# Patient Record
Sex: Female | Born: 1945 | Race: Black or African American | Hispanic: No | Marital: Single | Smoking: Never smoker
Health system: Southern US, Community
[De-identification: ages and names within clinical notes are randomized; demographics above are authoritative.]

## PROBLEM LIST (undated history)

## (undated) DIAGNOSIS — H25819 Combined forms of age-related cataract, unspecified eye: Secondary | ICD-10-CM

## (undated) DIAGNOSIS — E785 Hyperlipidemia, unspecified: Secondary | ICD-10-CM

## (undated) DIAGNOSIS — H35033 Hypertensive retinopathy, bilateral: Secondary | ICD-10-CM

## (undated) DIAGNOSIS — B1920 Unspecified viral hepatitis C without hepatic coma: Secondary | ICD-10-CM

## (undated) DIAGNOSIS — I10 Essential (primary) hypertension: Secondary | ICD-10-CM

## (undated) DIAGNOSIS — M1990 Unspecified osteoarthritis, unspecified site: Secondary | ICD-10-CM

## (undated) HISTORY — PX: TOTAL ABDOMINAL HYSTERECTOMY: SHX209

## (undated) HISTORY — PX: TUBAL LIGATION: SHX77

## (undated) HISTORY — DX: Unspecified viral hepatitis C without hepatic coma: B19.20

## (undated) HISTORY — DX: Essential (primary) hypertension: I10

## (undated) HISTORY — DX: Hypertensive retinopathy, bilateral: H35.033

## (undated) HISTORY — DX: Hyperlipidemia, unspecified: E78.5

## (undated) HISTORY — DX: Unspecified osteoarthritis, unspecified site: M19.90

## (undated) HISTORY — DX: Combined forms of age-related cataract, unspecified eye: H25.819

---

## 1999-03-05 ENCOUNTER — Encounter: Payer: Self-pay | Admitting: Family Medicine

## 1999-03-05 ENCOUNTER — Inpatient Hospital Stay (HOSPITAL_COMMUNITY): Admission: EM | Admit: 1999-03-05 | Discharge: 1999-03-06 | Payer: Self-pay | Admitting: Emergency Medicine

## 1999-03-17 ENCOUNTER — Encounter: Admission: RE | Admit: 1999-03-17 | Discharge: 1999-06-15 | Payer: Self-pay | Admitting: Family Medicine

## 2000-10-10 ENCOUNTER — Emergency Department (HOSPITAL_COMMUNITY): Admission: EM | Admit: 2000-10-10 | Discharge: 2000-10-10 | Payer: Self-pay | Admitting: Internal Medicine

## 2001-01-12 ENCOUNTER — Other Ambulatory Visit: Admission: RE | Admit: 2001-01-12 | Discharge: 2001-01-12 | Payer: Self-pay | Admitting: Family Medicine

## 2001-10-23 ENCOUNTER — Encounter: Payer: Self-pay | Admitting: Family Medicine

## 2001-10-23 ENCOUNTER — Ambulatory Visit (HOSPITAL_COMMUNITY): Admission: RE | Admit: 2001-10-23 | Discharge: 2001-10-23 | Payer: Self-pay | Admitting: Family Medicine

## 2002-01-07 ENCOUNTER — Emergency Department (HOSPITAL_COMMUNITY): Admission: EM | Admit: 2002-01-07 | Discharge: 2002-01-07 | Payer: Self-pay | Admitting: Emergency Medicine

## 2002-01-25 ENCOUNTER — Encounter (HOSPITAL_BASED_OUTPATIENT_CLINIC_OR_DEPARTMENT_OTHER): Admission: RE | Admit: 2002-01-25 | Discharge: 2002-04-23 | Payer: Self-pay | Admitting: Internal Medicine

## 2002-01-29 ENCOUNTER — Other Ambulatory Visit: Admission: RE | Admit: 2002-01-29 | Discharge: 2002-01-29 | Payer: Self-pay | Admitting: Family Medicine

## 2002-05-28 ENCOUNTER — Encounter (HOSPITAL_BASED_OUTPATIENT_CLINIC_OR_DEPARTMENT_OTHER): Admission: RE | Admit: 2002-05-28 | Discharge: 2002-08-26 | Payer: Self-pay | Admitting: Internal Medicine

## 2003-02-01 ENCOUNTER — Ambulatory Visit (HOSPITAL_COMMUNITY): Admission: RE | Admit: 2003-02-01 | Discharge: 2003-02-01 | Payer: Self-pay | Admitting: Gastroenterology

## 2003-02-01 ENCOUNTER — Encounter: Payer: Self-pay | Admitting: Gastroenterology

## 2003-02-01 ENCOUNTER — Encounter (INDEPENDENT_AMBULATORY_CARE_PROVIDER_SITE_OTHER): Payer: Self-pay | Admitting: *Deleted

## 2003-07-10 ENCOUNTER — Other Ambulatory Visit: Admission: RE | Admit: 2003-07-10 | Discharge: 2003-07-10 | Payer: Self-pay | Admitting: Family Medicine

## 2004-04-22 ENCOUNTER — Encounter (HOSPITAL_BASED_OUTPATIENT_CLINIC_OR_DEPARTMENT_OTHER): Admission: RE | Admit: 2004-04-22 | Discharge: 2004-04-24 | Payer: Self-pay | Admitting: Internal Medicine

## 2005-11-29 ENCOUNTER — Encounter: Admission: RE | Admit: 2005-11-29 | Discharge: 2005-12-15 | Payer: Self-pay | Admitting: *Deleted

## 2007-04-28 ENCOUNTER — Ambulatory Visit: Payer: Self-pay | Admitting: Vascular Surgery

## 2007-04-28 ENCOUNTER — Ambulatory Visit (HOSPITAL_COMMUNITY): Admission: RE | Admit: 2007-04-28 | Discharge: 2007-04-28 | Payer: Self-pay | Admitting: Family Medicine

## 2008-01-31 ENCOUNTER — Ambulatory Visit: Payer: Self-pay | Admitting: Internal Medicine

## 2008-01-31 LAB — CONVERTED CEMR LAB
AST: 100 units/L — ABNORMAL HIGH (ref 0–37)
BUN: 12 mg/dL (ref 6–23)
Basophils Relative: 1 % (ref 0–1)
CO2: 26 meq/L (ref 19–32)
Calcium: 9.3 mg/dL (ref 8.4–10.5)
Chloride: 102 meq/L (ref 96–112)
Cholesterol: 172 mg/dL (ref 0–200)
Creatinine, Ser: 0.8 mg/dL (ref 0.40–1.20)
Glucose, Bld: 89 mg/dL (ref 70–99)
HDL: 64 mg/dL (ref >39)
Hemoglobin: 13.1 g/dL (ref 12.0–15.0)
Lymphs Abs: 2.5 10*3/uL (ref 0.7–4.0)
MCHC: 32.5 g/dL (ref 30.0–36.0)
MCV: 83.4 fL (ref 78.0–100.0)
Monocytes Absolute: 0.4 10*3/uL (ref 0.1–1.0)
Monocytes Relative: 9 % (ref 3–12)
Neutro Abs: 1.9 10*3/uL (ref 1.7–7.7)
RBC: 4.83 M/uL (ref 3.87–5.11)
Total CHOL/HDL Ratio: 2.7
Triglycerides: 90 mg/dL (ref <150)

## 2008-02-02 ENCOUNTER — Ambulatory Visit (HOSPITAL_COMMUNITY): Admission: RE | Admit: 2008-02-02 | Discharge: 2008-02-02 | Payer: Self-pay | Admitting: Family Medicine

## 2008-03-05 ENCOUNTER — Ambulatory Visit: Payer: Self-pay | Admitting: *Deleted

## 2008-03-12 ENCOUNTER — Ambulatory Visit: Payer: Self-pay | Admitting: Internal Medicine

## 2008-03-12 ENCOUNTER — Encounter: Payer: Self-pay | Admitting: Internal Medicine

## 2008-06-12 ENCOUNTER — Ambulatory Visit: Payer: Self-pay | Admitting: Internal Medicine

## 2008-07-25 ENCOUNTER — Encounter: Admission: RE | Admit: 2008-07-25 | Discharge: 2008-07-25 | Payer: Self-pay | Admitting: Internal Medicine

## 2008-10-17 ENCOUNTER — Ambulatory Visit: Payer: Self-pay | Admitting: Internal Medicine

## 2008-10-23 ENCOUNTER — Encounter: Payer: Self-pay | Admitting: Cardiology

## 2008-11-12 ENCOUNTER — Ambulatory Visit: Payer: Self-pay | Admitting: Internal Medicine

## 2008-11-14 ENCOUNTER — Ambulatory Visit (HOSPITAL_COMMUNITY): Admission: RE | Admit: 2008-11-14 | Discharge: 2008-11-14 | Payer: Self-pay | Admitting: Internal Medicine

## 2008-12-11 ENCOUNTER — Ambulatory Visit: Payer: Self-pay | Admitting: Cardiology

## 2008-12-11 DIAGNOSIS — R0602 Shortness of breath: Secondary | ICD-10-CM

## 2008-12-12 DIAGNOSIS — E785 Hyperlipidemia, unspecified: Secondary | ICD-10-CM

## 2008-12-12 DIAGNOSIS — I10 Essential (primary) hypertension: Secondary | ICD-10-CM

## 2008-12-12 DIAGNOSIS — F17200 Nicotine dependence, unspecified, uncomplicated: Secondary | ICD-10-CM | POA: Insufficient documentation

## 2008-12-24 ENCOUNTER — Telehealth (INDEPENDENT_AMBULATORY_CARE_PROVIDER_SITE_OTHER): Payer: Self-pay | Admitting: *Deleted

## 2008-12-25 ENCOUNTER — Encounter: Payer: Self-pay | Admitting: Internal Medicine

## 2008-12-25 ENCOUNTER — Ambulatory Visit: Payer: Self-pay

## 2009-01-01 ENCOUNTER — Ambulatory Visit: Payer: Self-pay | Admitting: Internal Medicine

## 2009-03-05 ENCOUNTER — Ambulatory Visit: Payer: Self-pay | Admitting: Internal Medicine

## 2009-03-18 ENCOUNTER — Ambulatory Visit: Payer: Self-pay | Admitting: Internal Medicine

## 2009-03-18 LAB — CONVERTED CEMR LAB: Microalb, Ur: 0.5 mg/dL (ref 0.00–1.89)

## 2009-06-17 ENCOUNTER — Ambulatory Visit: Payer: Self-pay | Admitting: Internal Medicine

## 2009-06-17 LAB — CONVERTED CEMR LAB
ALT: 63 units/L — ABNORMAL HIGH (ref 0–35)
AST: 57 units/L — ABNORMAL HIGH (ref 0–37)
Albumin: 3.9 g/dL (ref 3.5–5.2)
BUN: 11 mg/dL (ref 6–23)
CO2: 25 meq/L (ref 19–32)
Calcium: 9.3 mg/dL (ref 8.4–10.5)
Chloride: 101 meq/L (ref 96–112)
Creatinine, Ser: 0.77 mg/dL (ref 0.40–1.20)
Eosinophils Absolute: 0.1 10*3/uL (ref 0.0–0.7)
Eosinophils Relative: 1 % (ref 0–5)
HCT: 37.5 % (ref 36.0–46.0)
Hemoglobin: 12.1 g/dL (ref 12.0–15.0)
Lymphocytes Relative: 46 % (ref 12–46)
Lymphs Abs: 2.2 10*3/uL (ref 0.7–4.0)
MCV: 83.5 fL (ref 78.0–100.0)
Monocytes Relative: 12 % (ref 3–12)
Potassium: 3.9 meq/L (ref 3.5–5.3)
RBC: 4.49 M/uL (ref 3.87–5.11)
WBC: 4.7 10*3/uL (ref 4.0–10.5)

## 2009-09-01 ENCOUNTER — Encounter: Admission: RE | Admit: 2009-09-01 | Discharge: 2009-09-01 | Payer: Self-pay | Admitting: Internal Medicine

## 2009-10-01 ENCOUNTER — Ambulatory Visit: Payer: Self-pay | Admitting: Family Medicine

## 2009-12-03 ENCOUNTER — Ambulatory Visit: Payer: Self-pay | Admitting: Internal Medicine

## 2009-12-03 LAB — CONVERTED CEMR LAB
Cholesterol: 137 mg/dL (ref 0–200)
Direct LDL: 71 mg/dL
HDL: 45 mg/dL (ref >39)
Hgb A1c MFr Bld: 6.6 % — ABNORMAL HIGH (ref <5.7)
Total CHOL/HDL Ratio: 3

## 2010-03-04 ENCOUNTER — Ambulatory Visit: Payer: Self-pay | Admitting: Internal Medicine

## 2010-06-09 ENCOUNTER — Inpatient Hospital Stay (INDEPENDENT_AMBULATORY_CARE_PROVIDER_SITE_OTHER)
Admission: RE | Admit: 2010-06-09 | Discharge: 2010-06-09 | Disposition: A | Discharge status: Home / Self Care | Payer: Self-pay | Source: Ambulatory Visit | Attending: Family Medicine | Admitting: Family Medicine

## 2010-06-09 DIAGNOSIS — T2610XA Burn of cornea and conjunctival sac, unspecified eye, initial encounter: Secondary | ICD-10-CM

## 2010-09-04 NOTE — Consult Note (Signed)
NAMEMarland Kitchen  Cindy Taylor, Cindy Taylor NO.:  [id number]   MEDICAL RECORD NO.:  [id number]                    PATIENT TYPE:   LOCATION:                                       FACILITY:   PHYSICIAN:  Jonelle Sports. Sevier, M.D.              DATE OF BIRTH:   DATE OF CONSULTATION:  DATE OF DISCHARGE:                                   CONSULTATION   HISTORY OF PRESENT ILLNESS:  This 65 year old black female comes self  referred for evaluation of several calluses on her feet. The patient has had  type II diabetes mellitus for some time and apparently is in reasonably good  control of that disease. Recent capillary blood glucose was 129. She has  never had serious problems with her toes but had read in her health plan  materials the importance of good foot care and realized that she did have  some calluses and so came in for evaluation.   PAST MEDICAL HISTORY:  Notable for known abnormal liver function studies and  pneumomycosis.   ALLERGIES:  No known drug allergies.   MEDICATIONS:  Premarin.   PHYSICAL EXAMINATION:  EXTREMITIES: Examination today is limited to the  distal lower extremities. It is noted that the patient has quite a wide fore  foot anteriorly, though without any significant deformity per say. Her  pulses are adequate throughout her feet. She has no significant edema.  Testing is intact throughout. She does have callus formation on the dorsal  lateral aspects of both fifth toes, on the medial aspects of both hallices  (with both these being indicative of the squeeze of foot wear that is too  narrow anteriorly) as well as callus underlying the fifth metatarsal head  area on the left and calluses bilaterally at the heels.   DISPOSITION:  1. The patient was given instructions regarding foot care and diabetes     mellitus by video with nurse and physician reinforcement.  2. The matter of proper foot wear is discussed with her at some length and     she is  advised that she needs a so called wide toe shoe, in which the toe     is made purposely broader than the heel. She is referred to the [facility]     on [address] and/or to [employer], to investigate     obtaining some such shoes.  3. The calluses on the dorsal lateral aspects of both fifth toes and on the     medial aspects of hallices are sharply repaired  without incident. The     callus underlying the fifth metatarsal head on the left is found to be at     the site of an old trauma and this again, is reduced satisfactorily. The     lessor calluses on the heels do not require any attention at this point.  4. The patient is commended on consulting  [place] for foot care before she had     more serious problems.  5. It is recommended to the patient that she may return here on a as needed     basis but that in all likelihood, she may need some additional attention     with these calluses over several months, for example, three to four     months.                                               Jonelle Sports. Cheryll Cockayne, M.D.   RES/MEDQ  D:  01/25/2002  T:  01/25/2002  Job:  [id number]   cc:   Meredith Staggers, M.D.

## 2010-09-04 NOTE — Consult Note (Signed)
NAME:  Cindy Taylor, Cindy Taylor NO.:  [id number]   MEDICAL RECORD NO.:  [id number]          PATIENT TYPE:  REC   LOCATION:  FOOT                         FACILITY:  [facility]   PHYSICIAN:  Jonelle Sports. Sevier, M.D. DATE OF BIRTH:  05-Dec-1945   DATE OF CONSULTATION:  04/23/2004  DATE OF DISCHARGE:                                   CONSULTATION   HISTORY OF PRESENT ILLNESS:  This 65 year old black female has been referred  by the courtesy of Dr. Dayton Scrape for evaluation of soft tissue mass of the left  foot and pain in the nail of the right hallux.   The patient's medical history is notable for diabetes, type 2, which has  been apparently mild in nature and is currently managed with no medication  with hemoglobin A1C of 5.4.  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  She again is on no treatment for that.   The patient has had no previous foot problems of any particular note until  recent months when she has noted some tendency towards soft tissue swelling  on the dorsum of the left foot at approximately the navicular __________  area.  This comes and goes, is never particularly painful.  Is not  associated with any erythema, particularly painful.  Is not associated with  any erythema or warmth.  Has never drained or without given her any  particular problems.  She is just concerned to know what it may be.  She  apparently was told once in the past that if it did not bother her it did  not bother the doctor, and consequently, no specific recommendation was  made.   The patient's other problem was one of tenderness along the medial edge of  the nail of the right hallux.  She has had obvious thickening and a needle  mycosis of the hallucal nails bilaterally for some time, and is just noted  that recently she has a tenderness toward pain in the medial margin of that  nail.  Again, there has been no  recognized drainage.  There has been no  localized inflammation suggestive of paronychia.   She is here today for our evaluation regarding these two problems.   PAST MEDICAL HISTORY:  1.  Notable only for those things previously mentioned.  2.  She has no know medicinal allergies.  3.  Her only medication is Premarin 0.625 mg daily.   PHYSICAL EXAMINATION:  Examination today is limited to the distal lower  extremities.  The patient's feet are short and rather broad but without  gross deformity.  Skin temperature are normal and symmetrical.  Pulses are  everywhere palpable and quite adequate.  Monofilament testing shows  preservation of protective sensation throughout both feet.  There is no  edema.  The patient has callous formations at the interphalangeal joint  areas of the halluces bilaterally, on the medial aspects and also across the  lateral calluses on the fifth toes bilaterally.   The soft tissue mass of concern to her is minimally  apparent today, is in  the dorsal mid foot area on the left, is not tender and is a moveable mass  with its deep margins not discernable.  There is no discoloration nor  limitation of motion in that foot.   IMPRESSION:  1.  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  2.  Onychomycosis of the halluces bilaterally with some early ingrowth of      the nail of the right hallux with hypertrophied callous formation at its      medial margin.   DISPOSITION:  1.  The patient was given instruction regarding foot care and diabetes by      video with some nurse and physician reinforcement.  2.  She is reassured that the synovial cyst of the left foot requires no      treatment unless it should become far more problematic than it is at      present, given that circumstance that a relatively simple surgery would      be probably the advised approach, but that the likelihood of her ever      needing this is essentially nil.  3.  The  aforementioned calluses on the halluces and fifth toes bilaterally      are sharply paired without incident.  4.  The callous around the medial aspect and tip of the right hallux is      sharply paired away, and this allows me, in turn, to debrided away some      of the hard crusty mycotic subungual material.  She reports that the toe      is immediately more comfortable.  There is no evidence of perionychial      inflammation and no drainage is noted.  An elevator is used and reveals      that there is not yet significant ingrowth of that distal medial corner      of that hallucal nail.  5.  It is recommended to the patient that she obtain some oral Tinactin      (tolnaftate) and that she apply this twice daily around the nails and at      the tips of the toes so that it can run subungual of the halluces of      both feet.  6.  Follow up visit will be here on a p.r.n. basis.       RES/MEDQ  D:  04/23/2004  T:  04/23/2004  Job:  [id number]   cc:   Meredith Staggers, M.D.  [address]  [place]  [place] [address]  Fax: [phone]

## 2010-10-22 ENCOUNTER — Encounter: Payer: Self-pay | Admitting: Cardiology

## 2010-10-23 ENCOUNTER — Other Ambulatory Visit: Payer: Self-pay | Admitting: Family Medicine

## 2010-10-23 DIAGNOSIS — Z1231 Encounter for screening mammogram for malignant neoplasm of breast: Secondary | ICD-10-CM

## 2010-11-04 ENCOUNTER — Ambulatory Visit
Admission: RE | Admit: 2010-11-04 | Discharge: 2010-11-04 | Disposition: A | Discharge status: Home / Self Care | Payer: Self-pay | Source: Ambulatory Visit | Attending: Family Medicine | Admitting: Family Medicine

## 2010-11-04 DIAGNOSIS — Z1231 Encounter for screening mammogram for malignant neoplasm of breast: Secondary | ICD-10-CM

## 2011-11-15 ENCOUNTER — Other Ambulatory Visit: Payer: Self-pay | Admitting: Family Medicine

## 2011-11-15 DIAGNOSIS — Z1231 Encounter for screening mammogram for malignant neoplasm of breast: Secondary | ICD-10-CM

## 2011-11-29 ENCOUNTER — Ambulatory Visit
Admission: RE | Admit: 2011-11-29 | Discharge: 2011-11-29 | Disposition: A | Discharge status: Home / Self Care | Payer: Self-pay | Source: Ambulatory Visit | Attending: Family Medicine | Admitting: Family Medicine

## 2011-11-29 ENCOUNTER — Other Ambulatory Visit: Payer: Self-pay | Admitting: Family Medicine

## 2011-11-29 DIAGNOSIS — Z1239 Encounter for other screening for malignant neoplasm of breast: Secondary | ICD-10-CM

## 2011-11-29 DIAGNOSIS — Z1231 Encounter for screening mammogram for malignant neoplasm of breast: Secondary | ICD-10-CM

## 2012-05-10 ENCOUNTER — Emergency Department (HOSPITAL_COMMUNITY)
Admission: EM | Admit: 2012-05-10 | Discharge: 2012-05-10 | Disposition: A | Discharge status: Home / Self Care | Payer: Self-pay | Source: Home / Self Care | Attending: Family Medicine | Admitting: Family Medicine

## 2012-05-10 ENCOUNTER — Encounter (HOSPITAL_COMMUNITY): Payer: Self-pay

## 2012-05-10 DIAGNOSIS — E785 Hyperlipidemia, unspecified: Secondary | ICD-10-CM

## 2012-05-10 DIAGNOSIS — E119 Type 2 diabetes mellitus without complications: Secondary | ICD-10-CM

## 2012-05-10 DIAGNOSIS — Z23 Encounter for immunization: Secondary | ICD-10-CM

## 2012-05-10 DIAGNOSIS — K029 Dental caries, unspecified: Secondary | ICD-10-CM

## 2012-05-10 DIAGNOSIS — I10 Essential (primary) hypertension: Secondary | ICD-10-CM

## 2012-05-10 LAB — COMPREHENSIVE METABOLIC PANEL
ALT: 132 U/L — ABNORMAL HIGH (ref 0–35)
AST: 90 U/L — ABNORMAL HIGH (ref 0–37)
Albumin: 3.7 g/dL (ref 3.5–5.2)
Alkaline Phosphatase: 57 U/L (ref 39–117)
CO2: 28 mEq/L (ref 19–32)
Chloride: 97 mEq/L (ref 96–112)
Creatinine, Ser: 0.62 mg/dL (ref 0.50–1.10)
GFR calc non Af Amer: >90 mL/min (ref >90)
Potassium: 3.7 mEq/L (ref 3.5–5.1)
Total Bilirubin: 0.3 mg/dL (ref 0.3–1.2)

## 2012-05-10 LAB — LIPID PANEL
HDL: 51 mg/dL (ref >39)
LDL Cholesterol: 66 mg/dL (ref 0–99)
Total CHOL/HDL Ratio: 3.1 RATIO
VLDL: 39 mg/dL (ref 0–40)

## 2012-05-10 MED ORDER — GLIPIZIDE 5 MG PO TABS: 10.0000 mg | ORAL_TABLET | Freq: Two times a day (BID) | ORAL | Status: DC

## 2012-05-10 MED ORDER — METFORMIN HCL 1000 MG PO TABS: 1000.0000 mg | ORAL_TABLET | Freq: Two times a day (BID) | ORAL | Status: DC

## 2012-05-10 MED ORDER — INFLUENZA VIRUS VACC SPLIT PF IM SUSP
0.5000 mL | Freq: Once | INTRAMUSCULAR | Status: AC
  Administered 2012-05-10: 0.5 mL via INTRAMUSCULAR

## 2012-05-10 MED ORDER — INSULIN PEN NEEDLE 31G X 8 MM MISC: 1.0000 [IU] | Status: DC

## 2012-05-10 MED ORDER — INSULIN GLARGINE 100 UNIT/ML ~~LOC~~ SOLN: 20.0000 [IU] | Freq: Every day | SUBCUTANEOUS | Status: DC

## 2012-05-10 NOTE — ED Notes (Signed)
Former [facility] client here to establish a primary care dr. Has not used insulin in almost 4 months-

## 2012-05-10 NOTE — ED Provider Notes (Signed)
History    CSN: [id number]  Arrival date & time 05/10/12  1509  Chief Complaint  Patient presents with  . Diabetes   HPI Pt presenting for refills of her medications for DM.  She reports that she's been out of her Lantus for quite some time because she hasn't been able to get the medication refilled.  She would like to go ahead and get a prescription refill today and then she will pursue resuming her Lantus treatment.  She was taking 20 units of Lantus every evening.  She reports that that improved her glycemic control significantly.  She's not able to test her blood sugar at this time because she does not testing strips available.  She reports that she's going to have medical insurance in the next 10 days and at that time she will go ahead and be able to get her medications filled appropriately.  Past Medical History  Diagnosis Date  . Diabetes mellitus     mellitus? x 9 years  . HTN (hypertension)     x8 years  . Hepatitis C     History reviewed. No pertinent past surgical history.  No family history on file.  History  Substance Use Topics  . Smoking status: Not on file  . Smokeless tobacco: Not on file  . Alcohol Use: Not on file    OB History    Grav Para Term Preterm Abortions TAB SAB Ect Mult Living                 Review of Systems  HENT: Negative.   Eyes: Negative.   Respiratory: Negative.   Cardiovascular: Negative.   Gastrointestinal: Negative.   Musculoskeletal: Negative.   All other systems reviewed and are negative.    Allergies  Review of patient's allergies indicates no known allergies.  Home Medications   Current Outpatient Rx  Name  Route  Sig  Dispense  Refill  . ASPIRIN 81 MG PO TBEC   Oral   Take 81 mg by mouth daily.           Marland Kitchen GLIPIZIDE 5 MG PO TABS   Oral   Take 5 mg by mouth daily.           Marland Kitchen LISINOPRIL-HYDROCHLOROTHIAZIDE 10-12.5 MG PO TABS   Oral   Take 1 tablet by mouth daily.           Marland Kitchen METFORMIN HCL 500 MG PO  TABS   Oral   Take 500 mg by mouth 2 (two) times daily.           . OYSTER SHELL CALCIUM 500 MG PO TABS   Oral   Take 1 tablet by mouth daily.           Marland Kitchen RALOXIFENE HCL 60 MG PO TABS   Oral   Take 60 mg by mouth daily.            BP 136/69  Pulse 60  Temp 98.6 F (37 C) (Oral)  Resp 17  SpO2 97%  Physical Exam  Nursing note and vitals reviewed. Constitutional: She is oriented to person, place, and time. She appears well-developed and well-nourished. No distress.  HENT:  Head: Normocephalic and atraumatic.  Eyes: EOM are normal. Pupils are equal, round, and reactive to light.  Neck: Normal range of motion. Neck supple.  Cardiovascular: Normal rate, regular rhythm and normal heart sounds.   Pulmonary/Chest: Effort normal and breath sounds normal. No respiratory distress. She has no wheezes. She  has no rales. She exhibits no tenderness.  Abdominal: Soft. Bowel sounds are normal.  Musculoskeletal: Normal range of motion.  Neurological: She is alert and oriented to person, place, and time. She has normal reflexes.  Skin: Skin is warm and dry. No rash noted. No erythema. No pallor.  Psychiatric: She has a normal mood and affect. Her behavior is normal. Judgment and thought content normal.    ED Course  Procedures (including critical care time)  Labs Reviewed - No data to display No results found.  No diagnosis found.  MDM  IMPRESSION  Diabetes mellitus type 2  Hypertension  RECOMMENDATIONS / PLAN CHECK LABS TODAY INFLUENZA VACCINE GIVEN TODAY REFILLED INSULIN (LANTUS SOLOSTAR PEN)   FOLLOW UP 3 MONTHS   The patient was given clear instructions to go to ER or return to medical center if symptoms don't improve, worsen or new problems develop.  The patient verbalized understanding.  The patient was told to call to get lab results if they haven't heard anything in the next week.            Cleora Fleet, MD 05/10/12 1920

## 2012-05-11 ENCOUNTER — Telehealth (HOSPITAL_COMMUNITY): Payer: Self-pay

## 2012-05-11 NOTE — Progress Notes (Signed)
Quick Note:  Please notify patient that her labs came back stable. Her liver enzymes are stable at this time. I would like for her to have her labs rechecked again in 3-4 months. Her blood sugar is elevated.    Rodney Langton, MD, CDE, FAAFP [facility] [place], [place]   ______

## 2012-05-11 NOTE — Telephone Encounter (Signed)
Message copied by Lestine Mount on Thu May 11, 2012  5:51 PM ------      Message from: Cleora Fleet      Created: Thu May 11, 2012  3:09 PM       Please notify patient that her labs came back stable.  Her liver enzymes are stable at this time.  I would like for her to have her labs rechecked again in 3-4 months.  Her blood sugar is elevated.                        Rodney Langton, MD, CDE, FAAFP      [facility]      [facility]      [place], [place]

## 2012-10-16 ENCOUNTER — Ambulatory Visit: Payer: Medicare Other | Attending: Family Medicine | Admitting: Internal Medicine

## 2012-10-16 VITALS — BP 133/76 | HR 71 | Temp 99.0°F | Ht 64.0 in | Wt 159.0 lb

## 2012-10-16 DIAGNOSIS — I10 Essential (primary) hypertension: Secondary | ICD-10-CM

## 2012-10-16 DIAGNOSIS — E1310 Other specified diabetes mellitus with ketoacidosis without coma: Secondary | ICD-10-CM

## 2012-10-16 DIAGNOSIS — E1110 Type 2 diabetes mellitus with ketoacidosis without coma: Secondary | ICD-10-CM | POA: Insufficient documentation

## 2012-10-16 DIAGNOSIS — B1920 Unspecified viral hepatitis C without hepatic coma: Secondary | ICD-10-CM

## 2012-10-16 MED ORDER — METFORMIN HCL 1000 MG PO TABS: 1000.0000 mg | ORAL_TABLET | Freq: Two times a day (BID) | ORAL | Status: DC

## 2012-10-16 MED ORDER — LISINOPRIL-HYDROCHLOROTHIAZIDE 10-12.5 MG PO TABS: 1.0000 | ORAL_TABLET | Freq: Every day | ORAL | Status: DC

## 2012-10-16 MED ORDER — INSULIN PEN NEEDLE 31G X 8 MM MISC: 1.0000 [IU] | Status: DC

## 2012-10-16 MED ORDER — GLIPIZIDE 5 MG PO TABS: 10.0000 mg | ORAL_TABLET | Freq: Two times a day (BID) | ORAL | Status: DC

## 2012-10-16 MED ORDER — METOPROLOL SUCCINATE ER 50 MG PO TB24: 50.0000 mg | ORAL_TABLET | Freq: Every day | ORAL | Status: DC

## 2012-10-16 MED ORDER — PRAVASTATIN SODIUM 20 MG PO TABS: 20.0000 mg | ORAL_TABLET | Freq: Every day | ORAL | Status: DC

## 2012-10-16 MED ORDER — OYSTER SHELL CALCIUM 500 MG PO TABS: 1.0000 | ORAL_TABLET | Freq: Two times a day (BID) | ORAL | Status: DC

## 2012-10-16 MED ORDER — FINGERSTIX LANCETS MISC: 1.0000 | Freq: Three times a day (TID) | Status: DC

## 2012-10-16 MED ORDER — INSULIN GLARGINE 100 UNIT/ML SOLOSTAR PEN: 20.0000 [IU] | PEN_INJECTOR | Freq: Every day | SUBCUTANEOUS | Status: DC

## 2012-10-16 NOTE — Patient Instructions (Signed)
Type 2 Diabetes Mellitus, Adult  Type 2 diabetes mellitus is a long-term (chronic) disease. In type 2 diabetes:   The pancreas does not make enough of a hormone called insulin.   The cells in the body do not respond as well to the insulin that is made.   Both of the above can happen.  Normally, insulin moves sugars from food into tissue cells. This gives you energy. If you have type 2 diabetes, sugars cannot be moved into tissue cells. This causes high blood sugar (hyperglycemia).   HOME CARE   Have your hemoglobin A1c level checked twice a year. The level shows if your diabetes is under control or out of control.   Perform daily blood sugar testing as told by your doctor.   Check your keytone levels by testing your pee (urine) when you are sick and as told.   Take your diabetes or insulin medicine as told by your doctor.   Never run out of insulin.   Adjust how much insulin you give yourself based on how many carbs (carbohydrates) you eat. Carbs are in many foods, such as fruits, vegetables, whole grains, and dairy products.   Have a healthy snack between every healthy meal. Have 3 meals and 3 snacks a day.   Lose weight if you are overweight.   Carry a medical alert card or wear your medical alert jewelry.   Carry a 15 gram carb snack with you at all times. Examples include:   Glucose pills, 3 or 4.   Glucose gel, 15 gram tube.   Raisins, 2 tablespoons (24 grams).   Jelly beans, 6.   Animal crackers, 8.   Sugar pop, 4 ounces (120 milliliters).   Gummy treats, 9.   Notice low blood sugar (hypoglycemia) symptoms, such as:   Shaking (tremors).   Decreased ability to think clearly.   Sweating.   Increased heart rate.   Headache.   Dry mouth.   Hunger.   Crabbiness (irritability).   Being worried or tense (anxiety).   Restless sleep.   A change in speech or coordination.   Confusion.   Treat low blood sugar right away. If you are alert and can swallow, follow the 15:15 rule:   Take 15  20 grams of a rapid-acting glucose or carb. This includes glucose gel, glucose pills, or 4 ounces (120 milliters) of fruit juice, regular pop, or low-fat milk.   Check your blood sugar level after taking the glucose.   Take 15 20 grams of more glucose if the repeat blood sugar level is still 70 mg/dL (milligrams/deciliter) or below.   Eat a meal or snack within 1 hour of the blood sugar levels going back to normal.   Notice early symptoms of high blood sugar, such as:   Being really thirsty or drinking a lot (polydipsia).   Peeing (urinating) a lot (polyuria).   Do at least 150 minutes of physical activitity a week or as told.   Split the 150 minutes of activity up during the week. Do not do 150 minutes of activity in one day.   Perform exercises, such as weight lifting, at least 2 times a week or as told.   Adjust your insulin or food intake as needed if you start a new exercise or sport.   Follow your sick day plan when you are not able to eat or drink as usual.   Avoid tobacco use.   Women who are not pregnant should drink no more   than 1 drink a day. Men should drink no more than 2 drinks a day.   Only drink alcohol with food.   Ask your doctor if alcohol is safe for you.   Tell your doctor if you drink alcohol several times during the week.   See your doctor regularly.   Schedule an eye exam soon after you are diagnosed with diabetes. Schedule exams once every year.   Check your skin and feet every day. Check for cuts, bruises, redness, nail problems, bleeding, blisters, or sores. A doctor should do a foot exam once a year.   Brush your teeth and gums twice a day. Floss once a day. Visit your dentist regularly.   Share your diabetes plan with your workplace or school.   Stay up-to-date with shots that fight against diseases (immunizations).   Learn how to manage stress.   Get diabetes education and support as needed.   Ask your doctor for special help if:   You need help to maintain or  improve how you to do things on your own.   You need help to maintain or improve the quality of your life.   You have foot or hand problems.   You have trouble cleaning yourself, dressing, eating, or doing physical activity.  GET HELP RIGHT AWAY IF:   You have trouble breathing.   You have moderate to large ketone levels.   You are unable to eat food or drink fluids for more than 6 hours.   You feel sick to your stomach (nauseous) or throw up (vomit) for more than 6 hours.   Your blood sugar level is over 240 mg/dL.   There is a change in mental status.   You get another serious illness.   You have watery poop (diarrhea) for more than 6 hours.   You have been sick or have had a fever for 2 or more days and are not getting better.   You have pain when you are physically active.  MAKE SURE YOU:   Understand these instructions.   Will watch your condition.   Will get help right away if you are not doing well or get worse.  Document Released: 01/13/2008 Document Revised: 12/29/2011 Document Reviewed: 09/21/2010  [employer] Patient Information 2014 [employer].

## 2012-10-16 NOTE — Progress Notes (Signed)
Patient ID: Cindy Taylor, female   DOB: 05/25/1945, 67 y.o.   MRN: [id number]  CC:  HPI: This is a 67 year old female who comes in for followup of chronic medical issues and refills on her medications. She states that she was able to obtain Medicaid and now is able to afford the Lantus that she previously had not been taking. She has no other complaints. No Known Allergies Past Medical History  Diagnosis Date  . Diabetes mellitus     mellitus? x 9 years  . HTN (hypertension)     x8 years  . Hepatitis C    Current Outpatient Prescriptions on File Prior to Visit  Medication Sig Dispense Refill  . raloxifene (EVISTA) 60 MG tablet Take 60 mg by mouth daily.         No current facility-administered medications on file prior to visit.   No family history on file. History   Social History  . Marital Status: Single    Spouse Name: N/A    Number of Children: N/A  . Years of Education: N/A   Occupational History  . Not on file.   Social History Main Topics  . Smoking status: Not on file  . Smokeless tobacco: Not on file  . Alcohol Use: Not on file  . Drug Use: Not on file  . Sexually Active: Not on file   Other Topics Concern  . Not on file   Social History Narrative  . No narrative on file    Review of Systems ______ Constitutional: Negative for fever, chills, diaphoresis, activity change, appetite change and fatigue. ____ HENT: Negative for ear pain, nosebleeds, congestion, facial swelling, rhinorrhea, neck pain, neck stiffness and ear discharge.  ____ Eyes: Negative for pain, discharge, redness, itching and visual disturbance. ____ Respiratory: Negative for cough, choking, chest tightness, shortness of breath, wheezing and stridor.  ____ Cardiovascular: Negative for chest pain, palpitations and leg swelling. ____ Gastrointestinal: Negative for abdominal distention, Nausea, vomiting or diarrhea/ constipation Genitourinary: Negative for dysuria, urgency, frequency,  hematuria, flank pain, decreased urine volume, difficulty urinating and dyspareunia. ____ Musculoskeletal: Negative for back pain, joint swelling, arthralgias and gait problem. ________ Neurological: Negative for dizziness, tremors, seizures, syncope, facial asymmetry, speech difficulty, weakness, light-headedness, numbness and headaches. ____ Hematological: Negative for adenopathy. Does not bruise/bleed easily. ____ Psychiatric/Behavioral: Negative for hallucinations, behavioral problems, confusion, dysphoric mood, decreased concentration and agitation. ______   Objective:   Filed Vitals:   10/16/12 1628  BP: 133/76  Pulse: 71  Temp: 99 F (37.2 C)    Physical Exam ______ Constitutional: Appears well-developed and well-nourished. No distress. ____ HENT: Normocephalic. External right and left ear normal. Oropharynx is clear and moist. ____ Eyes: Conjunctivae and EOM are normal. PERRLA, no scleral icterus. ____ Neck: Normal ROM. Neck supple. No JVD. No tracheal deviation. No thyromegaly. ____ CVS: RRR, S1/S2 +, no murmurs, no gallops, no carotid bruit.  Pulmonary: Effort and breath sounds normal, no stridor, rhonchi, wheezes, rales.  Abdominal: Soft. BS +,  no distension, tenderness, rebound or guarding. ________ Musculoskeletal: Normal range of motion. No edema and no tenderness. ____ Lymphadenopathy: No lymphadenopathy noted, cervical, inguinal. Neuro: Alert. Normal reflexes, muscle tone coordination. No cranial nerve deficit. Skin: Skin is warm and dry. No rash noted. Not diaphoretic. No erythema. No pallor. ____ Psychiatric: Normal mood and affect. Behavior, judgment, thought content normal. __  Lab Results  Component Value Date   WBC 4.7 06/17/2009   HGB 12.1 06/17/2009   HCT 37.5 06/17/2009  MCV 83.5 06/17/2009   PLT 192 06/17/2009   Lab Results  Component Value Date   CREATININE 0.62 05/10/2012   BUN 13 05/10/2012   NA 136 05/10/2012   K 3.7 05/10/2012   CL 97 05/10/2012   CO2  28 05/10/2012    Lab Results  Component Value Date   HGBA1C 9.1* 05/10/2012   Lipid Panel     Component Value Date/Time   CHOL 156 05/10/2012 1538   TRIG 197* 05/10/2012 1538   HDL 51 05/10/2012 1538   CHOLHDL 3.1 05/10/2012 1538   VLDL 39 05/10/2012 1538   LDLCALC 66 05/10/2012 1538       Assessment and plan:   Patient Active Problem List   Diagnosis Date Noted  . DM (diabetes mellitus) type 2, uncontrolled, with ketoacidosis 10/16/2012  . DYSLIPIDEMIA 12/12/2008  . TOBACCO USER 12/12/2008  . ESSENTIAL HYPERTENSION, BENIGN 12/12/2008  . DYSPNEA 12/11/2008       #1. Diabetes mellitus uncontrolled: Will start back Lantus at 20 mg each bedtime -she has not been checking her blood sugars because she did not have the materials needed. I have given her prescription for lancets and strips have asked her to start checking her blood sugars 3 times a day prior to meals. She can continue taking glipizide and metformin as before.  #2. Hypertension: continue Lisinopril/HCTZ  #3. Hypertriglyceridemia: Have advised her to avoid fatty foods. Once her blood sugars, under control this will likely come under control as well.  #4. Hep C/elevated LFTs: Not on any treatment for this.

## 2012-10-16 NOTE — Addendum Note (Signed)
Addended by: Calvert Cantor on: 10/16/2012 06:17 PM   Modules accepted: Orders

## 2012-10-17 ENCOUNTER — Other Ambulatory Visit: Payer: Self-pay | Admitting: Family Medicine

## 2012-10-17 MED ORDER — INSULIN PEN NEEDLE 31G X 8 MM MISC: Status: DC

## 2012-10-23 ENCOUNTER — Ambulatory Visit: Payer: Self-pay | Admitting: Nurse Practitioner

## 2012-10-25 ENCOUNTER — Telehealth: Payer: Self-pay | Admitting: Family Medicine

## 2012-10-25 NOTE — Telephone Encounter (Signed)
Pt was told to continue using metformin but she did not receive script even though Dr said she would write one, pt received all scripts except Metformin during 10/16/12 visit.  Please f/u with pt as to whether this can be called into [facility] on [address].   Pt also says she has an outdated meter (for diabetes test) and pharmacy said she would need a new meter for Medicare to cover charge.

## 2012-10-27 ENCOUNTER — Other Ambulatory Visit: Payer: Self-pay | Admitting: Family Medicine

## 2012-10-27 MED ORDER — BLOOD GLUC METER DISP-STRIPS DEVI: 1.0000 [IU] | Status: DC | PRN

## 2012-10-27 MED ORDER — METFORMIN HCL 850 MG PO TABS: 850.0000 mg | ORAL_TABLET | Freq: Two times a day (BID) | ORAL | Status: DC

## 2012-10-27 NOTE — Telephone Encounter (Signed)
Pt needs rx for metformin and script for new meter

## 2012-11-16 ENCOUNTER — Ambulatory Visit: Payer: Medicare Other | Attending: Family Medicine | Admitting: Family Medicine

## 2012-11-16 ENCOUNTER — Encounter: Payer: Self-pay | Admitting: Family Medicine

## 2012-11-16 VITALS — BP 155/88 | HR 91 | Temp 98.7°F | Ht 64.0 in | Wt 165.0 lb

## 2012-11-16 DIAGNOSIS — E785 Hyperlipidemia, unspecified: Secondary | ICD-10-CM

## 2012-11-16 DIAGNOSIS — E119 Type 2 diabetes mellitus without complications: Secondary | ICD-10-CM | POA: Insufficient documentation

## 2012-11-16 DIAGNOSIS — F17200 Nicotine dependence, unspecified, uncomplicated: Secondary | ICD-10-CM

## 2012-11-16 DIAGNOSIS — Z1239 Encounter for other screening for malignant neoplasm of breast: Secondary | ICD-10-CM

## 2012-11-16 LAB — LIPID PANEL
Cholesterol: 157 mg/dL (ref 0–200)
LDL Cholesterol: 75 mg/dL (ref 0–99)
Total CHOL/HDL Ratio: 3.3 Ratio
Triglycerides: 170 mg/dL — ABNORMAL HIGH (ref <150)
VLDL: 34 mg/dL (ref 0–40)

## 2012-11-16 LAB — POCT GLYCOSYLATED HEMOGLOBIN (HGB A1C): Hemoglobin A1C: 8

## 2012-11-16 NOTE — Progress Notes (Signed)
Patient ID: Cindy Taylor, female   DOB: November 22, 1945, 67 y.o.   MRN: [id number]  CC:  Follow up   HPI: Pt reports that she has been having better blood sugars now that she is taking her insulin.  Her BS have been between 75-150.  No BS less than 70.  No chest pain and no SOB.    No Known Allergies Past Medical History  Diagnosis Date  . Diabetes mellitus     mellitus? x 9 years  . HTN (hypertension)     x8 years  . Hepatitis C    Current Outpatient Prescriptions on File Prior to Visit  Medication Sig Dispense Refill  . Blood Gluc Meter Disp-Strips (BLOOD GLUCOSE METER DISPOSABLE) DEVI 1 Units by Does not apply route as needed.  1 each  5  . Fingerstix Lancets MISC 1 each by Does not apply route 3 (three) times daily before meals.  100 each  3  . glipiZIDE (GLUCOTROL) 5 MG tablet Take 2 tablets (10 mg total) by mouth 2 (two) times daily before a meal.  60 tablet  6  . Insulin Glargine (LANTUS SOLOSTAR) 100 UNIT/ML SOPN Inject 20 Units into the skin at bedtime.  1 pen  3  . Insulin Pen Needle 31G X 8 MM MISC Use 1 pen needle once per day or as directed  100 each  12  . lisinopril-hydrochlorothiazide (PRINZIDE,ZESTORETIC) 10-12.5 MG per tablet Take 1 tablet by mouth daily.  30 tablet  3  . metFORMIN (GLUCOPHAGE) 850 MG tablet Take 1 tablet (850 mg total) by mouth 2 (two) times daily with a meal.  60 tablet  1  . metoprolol succinate (TOPROL-XL) 50 MG 24 hr tablet Take 1 tablet (50 mg total) by mouth daily. Take with or immediately following a meal.  90 tablet  3  . Oyster Shell Calcium 500 MG TABS Take 1 tablet (1,250 mg total) by mouth 2 (two) times daily.  60 tablet  3  . pravastatin (PRAVACHOL) 20 MG tablet Take 1 tablet (20 mg total) by mouth daily.  90 tablet  3  . raloxifene (EVISTA) 60 MG tablet Take 60 mg by mouth daily.         No current facility-administered medications on file prior to visit.   No family history on file. History   Social History  . Marital Status: Single     Spouse Name: N/A    Number of Children: N/A  . Years of Education: N/A   Occupational History  . Not on file.   Social History Main Topics  . Smoking status: Never Smoker   . Smokeless tobacco: Current User    Types: Snuff  . Alcohol Use: No  . Drug Use: No  . Sexually Active: Not on file   Other Topics Concern  . Not on file   Social History Narrative  . No narrative on file    Review of Systems  Constitutional: Negative for fever, chills, diaphoresis, activity change, appetite change and fatigue.  HENT: Negative for ear pain, nosebleeds, congestion, facial swelling, rhinorrhea, neck pain, neck stiffness and ear discharge.   Eyes: Negative for pain, discharge, redness, itching and visual disturbance.  Respiratory: Negative for cough, choking, chest tightness, shortness of breath, wheezing and stridor.   Cardiovascular: Negative for chest pain, palpitations and leg swelling.  Gastrointestinal: Negative for abdominal distention.  Genitourinary: Negative for dysuria, urgency, frequency, hematuria, flank pain, decreased urine volume, difficulty urinating and dyspareunia.  Musculoskeletal: Negative for back pain,  joint swelling, arthralgias and gait problem.  Neurological: Negative for dizziness, tremors, seizures, syncope, facial asymmetry, speech difficulty, weakness, light-headedness, numbness and headaches.  Hematological: Negative for adenopathy. Does not bruise/bleed easily.  Psychiatric/Behavioral: Negative for hallucinations, behavioral problems, confusion, dysphoric mood, decreased concentration and agitation.    Objective:   Filed Vitals:   11/16/12 1630  BP: 155/88  Pulse: 91  Temp: 98.7 F (37.1 C)    Physical Exam  Constitutional: Appears well-developed and well-nourished. No distress.  HENT: Normocephalic. External right and left ear normal. Oropharynx is clear and moist.  Eyes: Conjunctivae and EOM are normal. PERRLA, no scleral icterus.  Neck: Normal  ROM. Neck supple. No JVD. No tracheal deviation. No thyromegaly.  CVS: RRR, S1/S2 +, no murmurs, no gallops, no carotid bruit.  Pulmonary: Effort and breath sounds normal, no stridor, rhonchi, wheezes, rales.  Abdominal: Soft. BS +,  no distension, tenderness, rebound or guarding.  Musculoskeletal: Normal range of motion. No edema and no tenderness.  Lymphadenopathy: No lymphadenopathy noted, cervical, inguinal. Neuro: Alert. Normal reflexes, muscle tone coordination. No cranial nerve deficit. Skin: Skin is warm and dry. No rash noted. Not diaphoretic. No erythema. No pallor.  Psychiatric: Normal mood and affect. Behavior, judgment, thought content normal.   Lab Results  Component Value Date   WBC 4.7 06/17/2009   HGB 12.1 06/17/2009   HCT 37.5 06/17/2009   MCV 83.5 06/17/2009   PLT 192 06/17/2009   Lab Results  Component Value Date   CREATININE 0.62 05/10/2012   BUN 13 05/10/2012   NA 136 05/10/2012   K 3.7 05/10/2012   CL 97 05/10/2012   CO2 28 05/10/2012    Lab Results  Component Value Date   HGBA1C 9.1* 05/10/2012   Lipid Panel     Component Value Date/Time   CHOL 156 05/10/2012 1538   TRIG 197* 05/10/2012 1538   HDL 51 05/10/2012 1538   CHOLHDL 3.1 05/10/2012 1538   VLDL 39 05/10/2012 1538   LDLCALC 66 05/10/2012 1538       Assessment and plan:   Patient Active Problem List   Diagnosis Date Noted  . Type II or unspecified type diabetes mellitus without mention of complication, not stated as uncontrolled 11/16/2012  . Dyslipidemia 11/16/2012  . DM (diabetes mellitus) type 2, uncontrolled, with ketoacidosis 10/16/2012  . DYSLIPIDEMIA 12/12/2008  . TOBACCO USER 12/12/2008  . ESSENTIAL HYPERTENSION, BENIGN 12/12/2008  . DYSPNEA 12/11/2008    Check labs today  Check A1c   Follow lab results  Continue to test BS   The patient was given clear instructions to go to ER or return to medical center if symptoms don't improve, worsen or new problems develop.  The patient verbalized  understanding.  The patient was told to call to get any lab results if not heard anything in the next week.    Rodney Langton, MD, CDE, FAAFP [facility] [place], [place]

## 2012-11-16 NOTE — Patient Instructions (Addendum)
Cholesterol Cholesterol is a white, waxy, fat-like protein needed by your body in small amounts. The liver makes all the cholesterol you need. It is carried from the liver by the blood through the blood vessels. Deposits (plaque) may build up on blood vessel walls. This makes the arteries narrower and stiffer. Plaque increases the risk for heart attack and stroke. You cannot feel your cholesterol level even if it is very high. The only way to know is by a blood test to check your lipid (fats) levels. Once you know your cholesterol levels, you should keep a record of the test results. Work with your caregiver to to keep your levels in the desired range. WHAT THE RESULTS MEAN:  Total cholesterol is a rough measure of all the cholesterol in your blood.  LDL is the so-called bad cholesterol. This is the type that deposits cholesterol in the walls of the arteries. You want this level to be low.  HDL is the good cholesterol because it cleans the arteries and carries the LDL away. You want this level to be high.  Triglycerides are fat that the body can either burn for energy or store. High levels are closely linked to heart disease. DESIRED LEVELS:  Total cholesterol below 200.  LDL below 100 for people at risk, below 70 for very high risk.  HDL above 50 is good, above 60 is best.  Triglycerides below 150. HOW TO LOWER YOUR CHOLESTEROL:  Diet.  Choose fish or white meat chicken and [place], roasted or baked. Limit fatty cuts of red meat, fried foods, and processed meats, such as sausage and lunch meat.  Eat lots of fresh fruits and vegetables. Choose whole grains, beans, pasta, potatoes and cereals.  Use only small amounts of olive, corn or canola oils. Avoid butter, mayonnaise, shortening or palm kernel oils. Avoid foods with trans-fats.  Use skim/nonfat milk and low-fat/nonfat yogurt and cheeses. Avoid whole milk, cream, ice cream, egg yolks and cheeses. Healthy desserts include angel food  cake, ginger snaps, animal crackers, hard candy, popsicles, and low-fat/nonfat frozen yogurt. Avoid pastries, cakes, pies and cookies.  Exercise.  A regular program helps decrease LDL and raises HDL.  Helps with weight control.  Do things that increase your activity level like gardening, walking, or taking the stairs.  Medication.  May be prescribed by your caregiver to help lowering cholesterol and the risk for heart disease.  You may need medicine even if your levels are normal if you have several risk factors. HOME CARE INSTRUCTIONS   Follow your diet and exercise programs as suggested by your caregiver.  Take medications as directed.  Have blood work done when your caregiver feels it is necessary. MAKE SURE YOU:   Understand these instructions.  Will watch your condition.  Will get help right away if you are not doing well or get worse. Document Released: 12/29/2000 Document Revised: 06/28/2011 Document Reviewed: 06/21/2007 [facility] Patient Information 2014 [place], [place]. Hypertension As your heart beats, it forces blood through your arteries. This force is your blood pressure. If the pressure is too high, it is called hypertension (HTN) or high blood pressure. HTN is dangerous because you may have it and not know it. High blood pressure may mean that your heart has to work harder to pump blood. Your arteries may be narrow or stiff. The extra work puts you at risk for heart disease, stroke, and other problems.  Blood pressure consists of two numbers, a higher number over a lower, 110/72, for example.  It is stated as "110 over 72." The ideal is below 120 for the top number (systolic) and under 80 for the bottom (diastolic). Write down your blood pressure today. You should pay close attention to your blood pressure if you have certain conditions such as:  Heart failure.  Prior heart attack.  Diabetes  Chronic kidney disease.  Prior stroke.  Multiple risk factors for  heart disease. To see if you have HTN, your blood pressure should be measured while you are seated with your arm held at the level of the heart. It should be measured at least twice. A one-time elevated blood pressure reading (especially in the Emergency Department) does not mean that you need treatment. There may be conditions in which the blood pressure is different between your right and left arms. It is important to see your caregiver soon for a recheck. Most people have essential hypertension which means that there is not a specific cause. This type of high blood pressure may be lowered by changing lifestyle factors such as:  Stress.  Smoking.  Lack of exercise.  Excessive weight.  Drug/tobacco/alcohol use.  Eating less salt. Most people do not have symptoms from high blood pressure until it has caused damage to the body. Effective treatment can often prevent, delay or reduce that damage. TREATMENT  When a cause has been identified, treatment for high blood pressure is directed at the cause. There are a large number of medications to treat HTN. These fall into several categories, and your caregiver will help you select the medicines that are best for you. Medications may have side effects. You should review side effects with your caregiver. If your blood pressure stays high after you have made lifestyle changes or started on medicines,   Your medication(s) may need to be changed.  Other problems may need to be addressed.  Be certain you understand your prescriptions, and know how and when to take your medicine.  Be sure to follow up with your caregiver within the time frame advised (usually within two weeks) to have your blood pressure rechecked and to review your medications.  If you are taking more than one medicine to lower your blood pressure, make sure you know how and at what times they should be taken. Taking two medicines at the same time can result in blood pressure that is  too low. SEEK IMMEDIATE MEDICAL CARE IF:  You develop a severe headache, blurred or changing vision, or confusion.  You have unusual weakness or numbness, or a faint feeling.  You have severe chest or abdominal pain, vomiting, or breathing problems. MAKE SURE YOU:   Understand these instructions.  Will watch your condition.  Will get help right away if you are not doing well or get worse. Document Released: 04/05/2005 Document Revised: 06/28/2011 Document Reviewed: 11/24/2007 [facility] Patient Information 2014 [place], [place]. Smokeless Tobacco Use Smokeless tobacco is a loose, fine, or stringy tobacco. The tobacco is not smoked like a cigarette, but it is chewed or held in the lips or cheeks. It resembles tea and comes from the leaves of the tobacco plant. Smokeless tobacco is usually flavored, sweetened, or processed in some way. Although smokeless tobacco is not smoked into the lungs, its chemicals are absorbed through the membranes in the mouth and into the bloodstream. Its chemicals are also swallowed in saliva. The chemicals (nicotine and other toxins) are known to cause cancer. Smokeless tobacco contains up to 28 differentcarcinogens. CAUSES Nicotine is addictive. Smokeless tobacco contains nicotine, which  is a stimulant. This stimulant can give you a "buzz" or altered state. People can become addicted to the feeling it delivers.  SYMPTOMS Smokeless tobacco can cause health problems, including:  Bad breath.  Yellow-brown teeth.  Mouth sores.  Cracking and bleeding lips.  Gum disease, gum recession, and bone loss around the teeth.  Tooth decay.  Increased or irregular heart rate.  High blood pressure, heart disease, and stroke.  Cancer of the mouth, lips, tongue, pancreas, voice box (larynx), esophagus, colon, and bladder.  Precancerous lesion of the soft tissues of the mouth (leukoplakia).  Loss of your sense of taste. TREATMENT Talk with your caregiver about  ways you can quit. Quitting tobacco is a good decision for your health. Nicotine is addictive, but several options are available to help you quit including:  Nicotine replacement therapy (gum or patch).  Support and cessation programs. The following tips can help you quit:  Write down the reasons you would like to quit and look at them often.  Set a date during a low stress time to stop or cut back.  Ask family and friends for their support.  Remove all tobacco products from your home and work.  Replace the chewing tobacco with things like beef jerky, sunflower seeds, or shredded coconut.  Avoid situations that may make you want to chew tobacco.  Exercise and eat a healthy diet.  When you crave tobacco, distract yourself with drinking water, sugarless chewing gum, sugarless hard candy, exercising, or deep breathing. HOME CARE INSTRUCTIONS  See your dentist for regular oral health exams every 6 months.  Follow up with your caregiver as recommended. SEEK MEDICAL CARE OR DENTAL CARE IF:  You have bleeding or cracking lips, gums, or cheeks.  You have mouth sores, discolorations, or pain.  You have tooth pain.  You develop persistent irritation, burning, or sores in the mouth.  You have pain, tenderness, or numbness in the mouth.  You develop a lump, bumpy patch, or hardened skin inside the mouth.  The color changes inside your mouth (gray, white, or red spots).  You have difficulty chewing, swallowing, or speaking. Document Released: 09/07/2010 Document Revised: 06/28/2011 Document Reviewed: 09/07/2010 [facility] Patient Information 2014 [place], [place].

## 2012-11-17 LAB — COMPLETE METABOLIC PANEL WITH GFR
ALT: 107 U/L — ABNORMAL HIGH (ref 0–35)
AST: 75 U/L — ABNORMAL HIGH (ref 0–37)
Albumin: 4.1 g/dL (ref 3.5–5.2)
Alkaline Phosphatase: 52 U/L (ref 39–117)
BUN: 12 mg/dL (ref 6–23)
Calcium: 9.1 mg/dL (ref 8.4–10.5)
Chloride: 101 mEq/L (ref 96–112)
Potassium: 3.7 mEq/L (ref 3.5–5.3)

## 2012-11-17 LAB — CBC
HCT: 36.3 % (ref 36.0–46.0)
MCHC: 33.6 g/dL (ref 30.0–36.0)
Platelets: 157 10*3/uL (ref 150–400)
RDW: 14.7 % (ref 11.5–15.5)
WBC: 6.6 10*3/uL (ref 4.0–10.5)

## 2012-11-17 LAB — MICROALBUMIN / CREATININE URINE RATIO: Microalb, Ur: 0.5 mg/dL (ref 0.00–1.89)

## 2012-11-19 NOTE — Progress Notes (Signed)
Quick Note:  Please inform patient that labs came back stable. Liver enzymes are still elevated but stable from previous tests.   Rodney Langton, MD, CDE, FAAFP [facility] [place], [place]   ______

## 2012-11-20 ENCOUNTER — Telehealth: Payer: Self-pay | Admitting: *Deleted

## 2012-11-20 NOTE — Telephone Encounter (Signed)
11/20/12 Attempt to reach patient message left via telephone that labs came back stable. Liver enzymes are still elevated but stable from previous tests. PWilliams,RN BSN [facility]

## 2012-12-12 ENCOUNTER — Ambulatory Visit
Admission: RE | Admit: 2012-12-12 | Discharge: 2012-12-12 | Disposition: A | Discharge status: Home / Self Care | Payer: Medicare Other | Source: Ambulatory Visit | Attending: Family Medicine | Admitting: Family Medicine

## 2012-12-12 DIAGNOSIS — Z1239 Encounter for other screening for malignant neoplasm of breast: Secondary | ICD-10-CM

## 2013-01-05 ENCOUNTER — Telehealth: Payer: Self-pay

## 2013-01-05 NOTE — Telephone Encounter (Signed)
Pt needs metformin refilled  [facility] on [address]

## 2013-01-10 ENCOUNTER — Other Ambulatory Visit: Payer: Self-pay | Admitting: *Deleted

## 2013-01-10 DIAGNOSIS — E119 Type 2 diabetes mellitus without complications: Secondary | ICD-10-CM

## 2013-01-10 MED ORDER — METFORMIN HCL 850 MG PO TABS: 850.0000 mg | ORAL_TABLET | Freq: Two times a day (BID) | ORAL | Status: DC

## 2013-01-12 ENCOUNTER — Other Ambulatory Visit: Payer: Self-pay | Admitting: Emergency Medicine

## 2013-01-12 DIAGNOSIS — E119 Type 2 diabetes mellitus without complications: Secondary | ICD-10-CM

## 2013-01-12 MED ORDER — METFORMIN HCL 850 MG PO TABS: 850.0000 mg | ORAL_TABLET | Freq: Two times a day (BID) | ORAL | Status: DC

## 2013-01-12 NOTE — Telephone Encounter (Signed)
Called [facility] in regards of script Metformin 850 mg. Pt can pick med up

## 2013-01-15 ENCOUNTER — Telehealth: Payer: Self-pay | Admitting: Emergency Medicine

## 2013-01-15 NOTE — Telephone Encounter (Signed)
Script ordered for Metformin and sent to pharmacy.

## 2013-01-17 ENCOUNTER — Other Ambulatory Visit: Payer: Self-pay | Admitting: Internal Medicine

## 2013-01-17 DIAGNOSIS — C220 Liver cell carcinoma: Secondary | ICD-10-CM

## 2013-01-29 ENCOUNTER — Ambulatory Visit
Admission: RE | Admit: 2013-01-29 | Discharge: 2013-01-29 | Disposition: A | Discharge status: Home / Self Care | Payer: Medicare Other | Source: Ambulatory Visit | Attending: Internal Medicine | Admitting: Internal Medicine

## 2013-01-29 DIAGNOSIS — C220 Liver cell carcinoma: Secondary | ICD-10-CM

## 2013-02-05 ENCOUNTER — Other Ambulatory Visit: Payer: Self-pay | Admitting: Internal Medicine

## 2013-02-05 NOTE — Progress Notes (Unsigned)
Pt was referred by mysef to the [facility]. She has a genotype 1 Hep C. I have received a letter from Payton Emerald, DNP recommending pt wait for a newer directly acting antiviral which will be release later this yr by the FDA.  He plans on seeing her in the clinic in the next several weeks.   Please see document in EPIC.    Calvert Cantor, MD

## 2013-02-07 ENCOUNTER — Other Ambulatory Visit: Payer: Self-pay | Admitting: Internal Medicine

## 2013-02-16 ENCOUNTER — Encounter: Payer: Self-pay | Admitting: Internal Medicine

## 2013-02-16 ENCOUNTER — Ambulatory Visit: Payer: Medicare Other | Attending: Internal Medicine | Admitting: Internal Medicine

## 2013-02-16 VITALS — BP 144/83 | HR 78 | Temp 98.3°F | Resp 16 | Ht 63.0 in | Wt 169.0 lb

## 2013-02-16 DIAGNOSIS — E1110 Type 2 diabetes mellitus with ketoacidosis without coma: Secondary | ICD-10-CM

## 2013-02-16 DIAGNOSIS — Z23 Encounter for immunization: Secondary | ICD-10-CM

## 2013-02-16 DIAGNOSIS — I10 Essential (primary) hypertension: Secondary | ICD-10-CM

## 2013-02-16 DIAGNOSIS — E785 Hyperlipidemia, unspecified: Secondary | ICD-10-CM

## 2013-02-16 DIAGNOSIS — E1310 Other specified diabetes mellitus with ketoacidosis without coma: Secondary | ICD-10-CM

## 2013-02-16 DIAGNOSIS — E119 Type 2 diabetes mellitus without complications: Secondary | ICD-10-CM | POA: Insufficient documentation

## 2013-02-16 LAB — COMPREHENSIVE METABOLIC PANEL
Alkaline Phosphatase: 53 U/L (ref 39–117)
BUN: 13 mg/dL (ref 6–23)
CO2: 27 mEq/L (ref 19–32)
Creat: 0.7 mg/dL (ref 0.50–1.10)
Glucose, Bld: 150 mg/dL — ABNORMAL HIGH (ref 70–99)
Sodium: 138 mEq/L (ref 135–145)
Total Bilirubin: 0.6 mg/dL (ref 0.3–1.2)

## 2013-02-16 LAB — LIPID PANEL
Cholesterol: 157 mg/dL (ref 0–200)
HDL: 45 mg/dL (ref >39)
Total CHOL/HDL Ratio: 3.5 Ratio
VLDL: 55 mg/dL — ABNORMAL HIGH (ref 0–40)

## 2013-02-16 LAB — POCT GLYCOSYLATED HEMOGLOBIN (HGB A1C): Hemoglobin A1C: 7

## 2013-02-16 MED ORDER — METFORMIN HCL 850 MG PO TABS: 850.0000 mg | ORAL_TABLET | Freq: Two times a day (BID) | ORAL | Status: DC

## 2013-02-16 MED ORDER — LISINOPRIL-HYDROCHLOROTHIAZIDE 10-12.5 MG PO TABS: 1.0000 | ORAL_TABLET | Freq: Every day | ORAL | Status: DC

## 2013-02-16 NOTE — Progress Notes (Signed)
Pt is here to refill her medications and manage her diabetes.

## 2013-02-16 NOTE — Patient Instructions (Signed)
DASH Diet The DASH diet stands for "Dietary Approaches to Stop Hypertension." It is a healthy eating plan that has been shown to reduce high blood pressure (hypertension) in as little as 14 days, while also possibly providing other significant health benefits. These other health benefits include reducing the risk of breast cancer after menopause and reducing the risk of type 2 diabetes, heart disease, colon cancer, and stroke. Health benefits also include weight loss and slowing kidney failure in patients with chronic kidney disease.  DIET GUIDELINES  Limit salt (sodium). Your diet should contain less than 1500 mg of sodium daily.  Limit refined or processed carbohydrates. Your diet should include mostly whole grains. Desserts and added sugars should be used sparingly.  Include small amounts of heart-healthy fats. These types of fats include nuts, oils, and tub margarine. Limit saturated and trans fats. These fats have been shown to be harmful in the body. CHOOSING FOODS  The following food groups are based on a 2000 calorie diet. See your Registered Dietitian for individual calorie needs. Grains and Grain Products (6 to 8 servings daily)  Eat More Often: Whole-wheat bread, brown rice, whole-grain or wheat pasta, quinoa, popcorn without added fat or salt (air popped).  Eat Less Often: White bread, white pasta, white rice, cornbread. Vegetables (4 to 5 servings daily)  Eat More Often: Fresh, frozen, and canned vegetables. Vegetables may be raw, steamed, roasted, or grilled with a minimal amount of fat.  Eat Less Often/Avoid: Creamed or fried vegetables. Vegetables in a cheese sauce. Fruit (4 to 5 servings daily)  Eat More Often: All fresh, canned (in natural juice), or frozen fruits. Dried fruits without added sugar. One hundred percent fruit juice ( cup [237 mL] daily).  Eat Less Often: Dried fruits with added sugar. Canned fruit in light or heavy syrup. Lean Meats, Fish, and Poultry (2  servings or less daily. One serving is 3 to 4 oz [85-114 g]).  Eat More Often: Ninety percent or leaner ground beef, tenderloin, sirloin. Round cuts of beef, chicken breast, turkey breast. All fish. Grill, bake, or broil your meat. Nothing should be fried.  Eat Less Often/Avoid: Fatty cuts of meat, turkey, or chicken leg, thigh, or wing. Fried cuts of meat or fish. Dairy (2 to 3 servings)  Eat More Often: Low-fat or fat-free milk, low-fat plain or light yogurt, reduced-fat or part-skim cheese.  Eat Less Often/Avoid: Milk (whole, 2%).Whole milk yogurt. Full-fat cheeses. Nuts, Seeds, and Legumes (4 to 5 servings per week)  Eat More Often: All without added salt.  Eat Less Often/Avoid: Salted nuts and seeds, canned beans with added salt. Fats and Sweets (limited)  Eat More Often: Vegetable oils, tub margarines without trans fats, sugar-free gelatin. Mayonnaise and salad dressings.  Eat Less Often/Avoid: Coconut oils, palm oils, butter, stick margarine, cream, half and half, cookies, candy, pie. FOR MORE INFORMATION The Dash Diet Eating Plan: www.dashdiet.org Document Released: 03/25/2011 Document Revised: 06/28/2011 Document Reviewed: 03/25/2011 [employer] Patient Information 2014 [employer]. Hypertension As your heart beats, it forces blood through your arteries. This force is your blood pressure. If the pressure is too high, it is called hypertension (HTN) or high blood pressure. HTN is dangerous because you may have it and not know it. High blood pressure may mean that your heart has to work harder to pump blood. Your arteries may be narrow or stiff. The extra work puts you at risk for heart disease, stroke, and other problems.  Blood pressure consists of two numbers, a   higher number over a lower, 110/72, for example. It is stated as "110 over 72." The ideal is below 120 for the top number (systolic) and under 80 for the bottom (diastolic). Write down your blood pressure today. You  should pay close attention to your blood pressure if you have certain conditions such as:  Heart failure.  Prior heart attack.  Diabetes  Chronic kidney disease.  Prior stroke.  Multiple risk factors for heart disease. To see if you have HTN, your blood pressure should be measured while you are seated with your arm held at the level of the heart. It should be measured at least twice. A one-time elevated blood pressure reading (especially in the Emergency Department) does not mean that you need treatment. There may be conditions in which the blood pressure is different between your right and left arms. It is important to see your caregiver soon for a recheck. Most people have essential hypertension which means that there is not a specific cause. This type of high blood pressure may be lowered by changing lifestyle factors such as:  Stress.  Smoking.  Lack of exercise.  Excessive weight.  Drug/tobacco/alcohol use.  Eating less salt. Most people do not have symptoms from high blood pressure until it has caused damage to the body. Effective treatment can often prevent, delay or reduce that damage. TREATMENT  When a cause has been identified, treatment for high blood pressure is directed at the cause. There are a large number of medications to treat HTN. These fall into several categories, and your caregiver will help you select the medicines that are best for you. Medications may have side effects. You should review side effects with your caregiver. If your blood pressure stays high after you have made lifestyle changes or started on medicines,   Your medication(s) may need to be changed.  Other problems may need to be addressed.  Be certain you understand your prescriptions, and know how and when to take your medicine.  Be sure to follow up with your caregiver within the time frame advised (usually within two weeks) to have your blood pressure rechecked and to review your  medications.  If you are taking more than one medicine to lower your blood pressure, make sure you know how and at what times they should be taken. Taking two medicines at the same time can result in blood pressure that is too low. SEEK IMMEDIATE MEDICAL CARE IF:  You develop a severe headache, blurred or changing vision, or confusion.  You have unusual weakness or numbness, or a faint feeling.  You have severe chest or abdominal pain, vomiting, or breathing problems. MAKE SURE YOU:   Understand these instructions.  Will watch your condition.  Will get help right away if you are not doing well or get worse. Document Released: 04/05/2005 Document Revised: 06/28/2011 Document Reviewed: 11/24/2007 [facility] Patient Information 2014 [place], [place]. Diabetes and Exercise Regular exercise is important and can help:   Control blood glucose (sugar).  Decrease blood pressure.    Control blood lipids (cholesterol, triglycerides).  Improve overall health. BENEFITS FROM EXERCISE  Improved fitness.  Improved flexibility.  Improved endurance.  Increased bone density.  Weight control.  Increased muscle strength.  Decreased body fat.  Improvement of the body's use of insulin, a hormone.  Increased insulin sensitivity.  Reduction of insulin needs.  Reduced stress and tension.  Helps you feel better. People with diabetes who add exercise to their lifestyle gain additional benefits, including:  Weight  loss.  Reduced appetite.  Improvement of the body's use of blood glucose.  Decreased risk factors for heart disease:  Lowering of cholesterol and triglycerides.  Raising the level of good cholesterol (high-density lipoproteins, HDL).  Lowering blood sugar.  Decreased blood pressure. TYPE 1 DIABETES AND EXERCISE  Exercise will usually lower your blood glucose.  If blood glucose is greater than 240 mg/dl, check urine ketones. If ketones are present, do not  exercise.  Location of the insulin injection sites may need to be adjusted with exercise. Avoid injecting insulin into areas of the body that will be exercised. For example, avoid injecting insulin into:  The arms when playing tennis.  The legs when jogging. For more information, discuss this with your caregiver.  Keep a record of:  Food intake.  Type and amount of exercise.  Expected peak times of insulin action.  Blood glucose levels. Do this before, during, and after exercise. Review your records with your caregiver. This will help you to develop guidelines for adjusting food intake and insulin amounts.  TYPE 2 DIABETES AND EXERCISE  Regular physical activity can help control blood glucose.  Exercise is important because it may:  Increase the body's sensitivity to insulin.  Improve blood glucose control.  Exercise reduces the risk of heart disease. It decreases serum cholesterol and triglycerides. It also lowers blood pressure.  Those who take insulin or oral hypoglycemic agents should watch for signs of hypoglycemia. These signs include dizziness, shaking, sweating, chills, and confusion.  Body water is lost during exercise. It must be replaced. This will help to avoid loss of body fluids (dehydration) or heat stroke. Be sure to talk to your caregiver before starting an exercise program to make sure it is safe for you. Remember, any activity is better than none.  Document Released: 06/26/2003 Document Revised: 06/28/2011 Document Reviewed: 10/10/2008 [facility] Patient Information 2014 [place], [place].

## 2013-02-16 NOTE — Progress Notes (Signed)
Patient ID: Cindy Taylor, female   DOB: 02/15/1946, 66 y.o.   MRN: [id number] Patient Demographics  Cindy Taylor, is a 67 y.o. female  [id number]  [id number]  DOB - 1945-12-10  Chief Complaint  Patient presents with  . Follow-up        Subjective:   Cindy Taylor is a 67 y.o. female here today for a follow up visit. Patient needs some medication refills. No new complaints. She is awaiting her new medication for hep C. She claims to have quit smoking. Normal alcohol. All her preventive care up-to-date including diabetic retinopathy screening. She is compliant with medications. Patient has No headache, No chest pain, No abdominal pain - No Nausea, No new weakness tingling or numbness, No Cough - SOB.  ALLERGIES: No Known Allergies  PAST MEDICAL HISTORY: Past Medical History  Diagnosis Date  . Diabetes mellitus     mellitus? x 9 years  . HTN (hypertension)     x8 years  . Hepatitis C     MEDICATIONS AT HOME: Prior to Admission medications   Medication Sig Start Date End Date Taking? Authorizing Provider  Blood Gluc Meter Disp-Strips (BLOOD GLUCOSE METER DISPOSABLE) DEVI 1 Units by Does not apply route as needed. 10/27/12  Yes Clanford Cyndie Mull, MD  Blood Glucose Monitoring Suppl (ONE TOUCH ULTRA MINI) W/DEVICE KIT  10/27/12  Yes Historical Provider, MD  Fingerstix Lancets MISC 1 each by Does not apply route 3 (three) times daily before meals. 10/16/12  Yes Calvert Cantor, MD  glipiZIDE (GLUCOTROL) 5 MG tablet take 2 tablets by mouth twice a day before meals 02/07/13  Yes Calvert Cantor, MD  Insulin Glargine (LANTUS SOLOSTAR) 100 UNIT/ML SOPN Inject 20 Units into the skin at bedtime. 10/16/12  Yes Calvert Cantor, MD  Insulin Pen Needle 31G X 8 MM MISC Use 1 pen needle once per day or as directed 10/17/12  Yes Clanford Cyndie Mull, MD  lisinopril-hydrochlorothiazide (PRINZIDE,ZESTORETIC) 10-12.5 MG per tablet Take 1 tablet by mouth daily. 10/16/12  Yes Calvert Cantor, MD   metFORMIN (GLUCOPHAGE) 850 MG tablet Take 1 tablet (850 mg total) by mouth 2 (two) times daily with a meal. 01/12/13  Yes Clanford Cyndie Mull, MD  metoprolol succinate (TOPROL-XL) 50 MG 24 hr tablet Take 1 tablet (50 mg total) by mouth daily. Take with or immediately following a meal. 10/16/12  Yes Calvert Cantor, MD  ONE TOUCH ULTRA TEST test strip  10/31/12  Yes Historical Provider, MD  Ethelda Chick Calcium 500 MG TABS Take 1 tablet (1,250 mg total) by mouth 2 (two) times daily. 10/16/12  Yes Calvert Cantor, MD  pravastatin (PRAVACHOL) 20 MG tablet Take 1 tablet (20 mg total) by mouth daily. 10/16/12  Yes Calvert Cantor, MD  raloxifene (EVISTA) 60 MG tablet Take 60 mg by mouth daily.      Historical Provider, MD     Objective:   Filed Vitals:   02/16/13 1531  BP: 144/83  Pulse: 78  Temp: 98.3 F (36.8 C)  TempSrc: Oral  Resp: 16  Height: 5\' 3"  (1.6 m)  Weight: 169 lb (76.658 kg)  SpO2: 98%    Exam General appearance : Awake, alert, not in any distress. Speech Clear. Not toxic looking HEENT: Atraumatic and Normocephalic, pupils equally reactive to light and accomodation Neck: supple, no JVD. No cervical lymphadenopathy.  Chest:Good air entry bilaterally, no added sounds  CVS: S1 S2 regular, no murmurs.  Abdomen: Bowel sounds present, Non tender and not distended with no gaurding, rigidity or  rebound. Extremities: B/L Lower Ext shows no edema, both legs are warm to touch Neurology: Awake alert, and oriented X 3, CN II-XII intact, Non focal Skin:No Rash Wounds:N/A   Data Review   CBC No results found for this basename: WBC, HGB, HCT, PLT, MCV, MCH, MCHC, RDW, NEUTRABS, LYMPHSABS, MONOABS, EOSABS, BASOSABS, BANDABS, BANDSABD,  in the last 168 hours  Chemistries   No results found for this basename: NA, K, CL, CO2, GLUCOSE, BUN, CREATININE, GFRCGP, CALCIUM, MG, AST, ALT, ALKPHOS, BILITOT,  in the last 168  hours ------------------------------------------------------------------------------------------------------------------ No results found for this basename: HGBA1C,  in the last 72 hours ------------------------------------------------------------------------------------------------------------------ No results found for this basename: CHOL, HDL, LDLCALC, TRIG, CHOLHDL, LDLDIRECT,  in the last 72 hours ------------------------------------------------------------------------------------------------------------------ No results found for this basename: TSH, T4TOTAL, FREET3, T3FREE, THYROIDAB,  in the last 72 hours ------------------------------------------------------------------------------------------------------------------ No results found for this basename: VITAMINB12, FOLATE, FERRITIN, TIBC, IRON, RETICCTPCT,  in the last 72 hours  Coagulation profile  No results found for this basename: INR, PROTIME,  in the last 168 hours    Assessment & Plan   Patient Active Problem List   Diagnosis Date Noted  . Diabetes 02/16/2013  . Type II or unspecified type diabetes mellitus without mention of complication, not stated as uncontrolled 11/16/2012  . Dyslipidemia 11/16/2012  . DM (diabetes mellitus) type 2, uncontrolled, with ketoacidosis 10/16/2012  . DYSLIPIDEMIA 12/12/2008  . TOBACCO USER 12/12/2008  . ESSENTIAL HYPERTENSION, BENIGN 12/12/2008  . DYSPNEA 12/11/2008     Plan: Refill medications Patient encouraged to be compliant with medications as prescribed  Labs today: Hemoglobin A1c Comprehensive metabolic panel Lipid panel  Patient has been counseled extensively about nutrition and exercise  Health Maintenance -Colonoscopy: Up-to-date -Pap Smear: Up-to-date -Mammogram: Normal -Vaccinations:  -Influenza given today  Eye exam: Last done in April 2014 normal Follow up in 3 months or when necessary  The patient was given clear instructions to go to ER or return to  medical center if symptoms don't improve, worsen or new problems develop. The patient verbalized understanding. The patient was told to call to get lab results if they haven't heard anything in the next week.    Jeanann Lewandowsky, MD, MHA, FACP, FAAP [facility] [place], [place] [phone]   02/16/2013, 3:54 PM

## 2013-05-18 ENCOUNTER — Ambulatory Visit: Payer: Medicare Other | Attending: Internal Medicine | Admitting: Internal Medicine

## 2013-05-18 ENCOUNTER — Encounter: Payer: Self-pay | Admitting: Internal Medicine

## 2013-05-18 VITALS — BP 126/60 | HR 83 | Temp 98.3°F | Resp 16

## 2013-05-18 DIAGNOSIS — I10 Essential (primary) hypertension: Secondary | ICD-10-CM | POA: Insufficient documentation

## 2013-05-18 DIAGNOSIS — E119 Type 2 diabetes mellitus without complications: Secondary | ICD-10-CM

## 2013-05-18 DIAGNOSIS — E785 Hyperlipidemia, unspecified: Secondary | ICD-10-CM

## 2013-05-18 LAB — POCT GLYCOSYLATED HEMOGLOBIN (HGB A1C): HEMOGLOBIN A1C: 6.7

## 2013-05-18 MED ORDER — GLIPIZIDE 5 MG PO TABS: ORAL_TABLET | ORAL | Status: DC

## 2013-05-18 MED ORDER — PRAVASTATIN SODIUM 20 MG PO TABS: 20.0000 mg | ORAL_TABLET | Freq: Every day | ORAL | Status: DC

## 2013-05-18 MED ORDER — METOPROLOL SUCCINATE ER 50 MG PO TB24: 50.0000 mg | ORAL_TABLET | Freq: Every day | ORAL | Status: DC

## 2013-05-18 NOTE — Progress Notes (Signed)
MRN: [id number] Name: Cindy Taylor  Sex: female Age: 68 y.o. DOB: 08-11-1945  Allergies: Review of patient's allergies indicates no known allergies.  Chief Complaint  Patient presents with  . Diabetes    HPI: Patient is 68 y.o. female who comes today for followup history of diabetes and hypertension, has been compliant with her medication denies any hypoglycemic symptoms today her hemoglobin A1c is 6.7% which is improved from before. Her blood pressure is also controlled. Patient is requesting refill on her medications.   Past Medical History  Diagnosis Date  . Diabetes mellitus     mellitus? x 9 years  . HTN (hypertension)     x8 years  . Hepatitis C     History reviewed. No pertinent past surgical history.    Medication List       This list is accurate as of: 05/18/13  4:12 PM.  Always use your most recent med list.               BLOOD GLUCOSE METER DISPOSABLE Devi  1 Units by Does not apply route as needed.     FINGERSTIX LANCETS Misc  1 each by Does not apply route 3 (three) times daily before meals.     glipiZIDE 5 MG tablet  Commonly known as:  GLUCOTROL  take 2 tablets by mouth twice a day before meals     Insulin Glargine 100 UNIT/ML Solostar Pen  Commonly known as:  LANTUS SOLOSTAR  Inject 20 Units into the skin at bedtime.     Insulin Pen Needle 31G X 8 MM Misc  Use 1 pen needle once per day or as directed     lisinopril-hydrochlorothiazide 10-12.5 MG per tablet  Commonly known as:  PRINZIDE,ZESTORETIC  Take 1 tablet by mouth daily.     metFORMIN 850 MG tablet  Commonly known as:  GLUCOPHAGE  Take 1 tablet (850 mg total) by mouth 2 (two) times daily with a meal.     metoprolol succinate 50 MG 24 hr tablet  Commonly known as:  TOPROL-XL  Take 1 tablet (50 mg total) by mouth daily. Take with or immediately following a meal.     ONE TOUCH ULTRA MINI W/DEVICE Kit     ONE TOUCH ULTRA TEST test strip  Generic drug:  glucose blood     Oyster  Shell Calcium 500 MG Tabs  Take 1 tablet (1,250 mg total) by mouth 2 (two) times daily.     pravastatin 20 MG tablet  Commonly known as:  PRAVACHOL  Take 1 tablet (20 mg total) by mouth daily.     raloxifene 60 MG tablet  Commonly known as:  EVISTA  Take 60 mg by mouth daily.        Meds ordered this encounter  Medications  . glipiZIDE (GLUCOTROL) 5 MG tablet    Sig: take 2 tablets by mouth twice a day before meals    Dispense:  60 tablet    Refill:  6  . metoprolol succinate (TOPROL-XL) 50 MG 24 hr tablet    Sig: Take 1 tablet (50 mg total) by mouth daily. Take with or immediately following a meal.    Dispense:  90 tablet    Refill:  3  . pravastatin (PRAVACHOL) 20 MG tablet    Sig: Take 1 tablet (20 mg total) by mouth daily.    Dispense:  90 tablet    Refill:  3    Immunization History  Administered Date(s) Administered  . Influenza  Split 05/10/2012  . Influenza,inj,Quad PF,36+ Mos 02/16/2013    History reviewed. No pertinent family history.  History  Substance Use Topics  . Smoking status: Never Smoker   . Smokeless tobacco: Current User    Types: Snuff  . Alcohol Use: No    Review of Systems  As noted in HPI  Filed Vitals:   05/18/13 1600  BP: 126/60  Pulse: 83  Temp: 98.3 F (36.8 C)  Resp: 16    Physical Exam  Physical Exam  Constitutional: No distress.  Eyes: EOM are normal. Pupils are equal, round, and reactive to light.  Cardiovascular: Normal rate and regular rhythm.   Pulmonary/Chest: Breath sounds normal. No respiratory distress. She has no wheezes. She has no rales.  Musculoskeletal: She exhibits no edema.    CBC    Component Value Date/Time   WBC 6.6 11/16/2012 1701   RBC 4.44 11/16/2012 1701   HGB 12.2 11/16/2012 1701   HCT 36.3 11/16/2012 1701   PLT 157 11/16/2012 1701   MCV 81.8 11/16/2012 1701   LYMPHSABS 2.2 06/17/2009 2018   MONOABS 0.6 06/17/2009 2018   EOSABS 0.1 06/17/2009 2018   BASOSABS 0.0 06/17/2009 2018    CMP      Component Value Date/Time   NA 138 02/16/2013 1607   K 3.9 02/16/2013 1607   CL 102 02/16/2013 1607   CO2 27 02/16/2013 1607   GLUCOSE 150* 02/16/2013 1607   BUN 13 02/16/2013 1607   CREATININE 0.70 02/16/2013 1607   CREATININE 0.62 05/10/2012 1537   CALCIUM 8.8 02/16/2013 1607   PROT 7.5 02/16/2013 1607   ALBUMIN 3.8 02/16/2013 1607   AST 155* 02/16/2013 1607   ALT 177* 02/16/2013 1607   ALKPHOS 53 02/16/2013 1607   BILITOT 0.6 02/16/2013 1607   GFRNONAA >90 05/10/2012 1537   GFRAA >90 05/10/2012 1537    Lab Results  Component Value Date/Time   CHOL 157 02/16/2013  4:07 PM    No components found with this basename: hga1c    Lab Results  Component Value Date/Time   AST 155* 02/16/2013  4:07 PM    Assessment and Plan  DM (diabetes mellitus) - Plan: HgB A1c 6.7% continue with her metformin, Glucotrol, Lantus, will repeat A1c in 3 months continue to and monitor fingerstick.  Essential hypertension, benign Continue with current medications DYSLIPIDEMIA Continue with Pravachol.  Fasting blood work on the next visit.  Return in about 3 months (around 08/16/2013).  Lorayne Marek, MD

## 2013-05-18 NOTE — Progress Notes (Signed)
Patient here for follow up DM Needs medication refills

## 2013-05-18 NOTE — Patient Instructions (Signed)

## 2013-06-23 ENCOUNTER — Other Ambulatory Visit: Payer: Self-pay | Admitting: Internal Medicine

## 2013-07-24 ENCOUNTER — Encounter: Payer: Self-pay | Admitting: Pharmacist

## 2013-07-24 ENCOUNTER — Ambulatory Visit: Payer: Medicare Other | Attending: Internal Medicine | Admitting: Pharmacist

## 2013-07-24 VITALS — BP 136/83 | HR 73 | Temp 98.6°F | Ht 63.0 in | Wt 169.0 lb

## 2013-07-24 DIAGNOSIS — Z0000 Encounter for general adult medical examination without abnormal findings: Secondary | ICD-10-CM

## 2013-07-24 DIAGNOSIS — E119 Type 2 diabetes mellitus without complications: Secondary | ICD-10-CM | POA: Insufficient documentation

## 2013-07-24 LAB — POCT GLYCOSYLATED HEMOGLOBIN (HGB A1C): Hemoglobin A1C: 6.4

## 2013-07-24 LAB — GLUCOSE, POCT (MANUAL RESULT ENTRY): POC Glucose: 71 mg/dl (ref 70–99)

## 2013-07-24 NOTE — Progress Notes (Signed)
Diabetes Follow-Up Visit .Cindy Taylor is a 68 y.o.female presenting with Type II diabetes for a 3 month followup. Chief Complaint  Patient presents with  . Diabetes   on 07/24/2013  Filed Vitals:   07/24/13 1714  BP: 136/83  Pulse: 73  Temp: 98.6 F (37 C)    Current Diabetes Medications:  Lantus 20 units qhs, Metformin 850 mg BID, Glipizide 5 mg 2 po bid Current HTN Medications: Lisinopril/HCTZ 10/12.5 qd, Toprol XL 50 mg qd  Home monitoring: checks occassionally    Lab Results  Component Value Date   HGBA1C 6.4 07/24/2013    Lab Results  Component Value Date   MICROALBUR 0.50 11/16/2012    Lab Results  Component Value Date   CHOL 157 02/16/2013   HDL 45 02/16/2013   LDLCALC 57 02/16/2013   LDLDIRECT 71 12/03/2009   TRIG 277* 02/16/2013   CHOLHDL 3.5 02/16/2013     Assessment: 1.  Diabetes.  A1c at goal today 2.  Blood Pressure.  At goal (<140/90) 3.  Lipids.  Needs current labs 4.  Foot Care.  Pt checks daily 5.  Dental Care.  Needs exam 6.  Eye Care/Exam.  Has upcoming appt 7. Vaccines: no pneumococcal vaccine  Recommendations: 1.  Medication recommendations at this time are as follows:  Continue current diabetes medications at this time.  No changes. 2.  Reviewed HBG goals:  Fasting 80-130 and 1-2 hour post prandial <180.  Patient is instructed to check BG 2 times per day.    3.  BP goal < 140/90.  Continue current medications.  No changes. 4.  Labs today: lipids, cmp, cbc, tsh 5.  Pt received pneumococcal vaccine today. 6.  Continue checking feet daily.  Pt should return to clinic prn or in 3 months to see PCP.

## 2013-07-25 LAB — CBC WITH DIFFERENTIAL/PLATELET
BASOS ABS: 0 10*3/uL (ref 0.0–0.1)
BASOS PCT: 0 % (ref 0–1)
EOS ABS: 0.1 10*3/uL (ref 0.0–0.7)
EOS PCT: 1 % (ref 0–5)
HEMATOCRIT: 38.6 % (ref 36.0–46.0)
HEMOGLOBIN: 12.9 g/dL (ref 12.0–15.0)
Lymphocytes Relative: 45 % (ref 12–46)
Lymphs Abs: 3.9 10*3/uL (ref 0.7–4.0)
MCH: 27.2 pg (ref 26.0–34.0)
MCHC: 33.4 g/dL (ref 30.0–36.0)
MCV: 81.3 fL (ref 78.0–100.0)
MONO ABS: 0.6 10*3/uL (ref 0.1–1.0)
MONOS PCT: 7 % (ref 3–12)
Neutro Abs: 4 10*3/uL (ref 1.7–7.7)
Neutrophils Relative %: 47 % (ref 43–77)
Platelets: 167 10*3/uL (ref 150–400)
RBC: 4.75 MIL/uL (ref 3.87–5.11)
RDW: 14.6 % (ref 11.5–15.5)
WBC: 8.6 10*3/uL (ref 4.0–10.5)

## 2013-07-25 LAB — COMPREHENSIVE METABOLIC PANEL
ALT: 15 U/L (ref 0–35)
AST: 19 U/L (ref 0–37)
Albumin: 4.2 g/dL (ref 3.5–5.2)
Alkaline Phosphatase: 52 U/L (ref 39–117)
BUN: 14 mg/dL (ref 6–23)
CALCIUM: 9.6 mg/dL (ref 8.4–10.5)
CHLORIDE: 99 meq/L (ref 96–112)
CO2: 28 meq/L (ref 19–32)
CREATININE: 0.81 mg/dL (ref 0.50–1.10)
Glucose, Bld: 90 mg/dL (ref 70–99)
Potassium: 4 mEq/L (ref 3.5–5.3)
SODIUM: 140 meq/L (ref 135–145)
Total Bilirubin: 0.3 mg/dL (ref 0.2–1.2)
Total Protein: 7.6 g/dL (ref 6.0–8.3)

## 2013-07-25 LAB — LIPID PANEL
Cholesterol: 170 mg/dL (ref 0–200)
HDL: 45 mg/dL (ref >39)
LDL Cholesterol: 93 mg/dL (ref 0–99)
Total CHOL/HDL Ratio: 3.8 Ratio
Triglycerides: 160 mg/dL — ABNORMAL HIGH (ref <150)
VLDL: 32 mg/dL (ref 0–40)

## 2013-07-25 LAB — TSH: TSH: 1.325 u[IU]/mL (ref 0.350–4.500)

## 2013-09-17 ENCOUNTER — Other Ambulatory Visit: Payer: Self-pay | Admitting: *Deleted

## 2013-09-17 DIAGNOSIS — E119 Type 2 diabetes mellitus without complications: Secondary | ICD-10-CM

## 2013-09-17 MED ORDER — GLIPIZIDE 5 MG PO TABS: ORAL_TABLET | ORAL | Status: DC

## 2013-09-19 ENCOUNTER — Other Ambulatory Visit: Payer: Self-pay | Admitting: Nurse Practitioner

## 2013-09-19 DIAGNOSIS — C220 Liver cell carcinoma: Secondary | ICD-10-CM

## 2013-09-26 ENCOUNTER — Ambulatory Visit
Admission: RE | Admit: 2013-09-26 | Discharge: 2013-09-26 | Disposition: A | Discharge status: Home / Self Care | Payer: Medicare Other | Source: Ambulatory Visit | Attending: Nurse Practitioner | Admitting: Nurse Practitioner

## 2013-09-26 DIAGNOSIS — C220 Liver cell carcinoma: Secondary | ICD-10-CM

## 2013-10-15 ENCOUNTER — Other Ambulatory Visit: Payer: Self-pay | Admitting: Internal Medicine

## 2013-10-17 ENCOUNTER — Ambulatory Visit: Payer: Medicare Other | Attending: Internal Medicine | Admitting: Internal Medicine

## 2013-10-17 ENCOUNTER — Encounter: Payer: Self-pay | Admitting: Internal Medicine

## 2013-10-17 VITALS — BP 112/69 | HR 70 | Temp 98.2°F | Resp 14 | Ht 64.0 in | Wt 167.6 lb

## 2013-10-17 DIAGNOSIS — E785 Hyperlipidemia, unspecified: Secondary | ICD-10-CM | POA: Diagnosis not present

## 2013-10-17 DIAGNOSIS — I10 Essential (primary) hypertension: Secondary | ICD-10-CM

## 2013-10-17 DIAGNOSIS — E119 Type 2 diabetes mellitus without complications: Secondary | ICD-10-CM | POA: Diagnosis present

## 2013-10-17 DIAGNOSIS — B1920 Unspecified viral hepatitis C without hepatic coma: Secondary | ICD-10-CM | POA: Diagnosis not present

## 2013-10-17 LAB — GLUCOSE, POCT (MANUAL RESULT ENTRY): POC GLUCOSE: 127 mg/dL — AB (ref 70–99)

## 2013-10-17 LAB — POCT GLYCOSYLATED HEMOGLOBIN (HGB A1C): Hemoglobin A1C: 6.1

## 2013-10-17 MED ORDER — LISINOPRIL-HYDROCHLOROTHIAZIDE 10-12.5 MG PO TABS: 1.0000 | ORAL_TABLET | Freq: Every day | ORAL | Status: DC

## 2013-10-17 MED ORDER — METFORMIN HCL 850 MG PO TABS: 850.0000 mg | ORAL_TABLET | Freq: Two times a day (BID) | ORAL | Status: DC

## 2013-10-17 MED ORDER — METOPROLOL SUCCINATE ER 50 MG PO TB24: 50.0000 mg | ORAL_TABLET | Freq: Every day | ORAL | Status: DC

## 2013-10-17 MED ORDER — PRAVASTATIN SODIUM 20 MG PO TABS: 20.0000 mg | ORAL_TABLET | Freq: Every day | ORAL | Status: DC

## 2013-10-17 MED ORDER — GLIPIZIDE 5 MG PO TABS: ORAL_TABLET | ORAL | Status: DC

## 2013-10-17 NOTE — Patient Instructions (Signed)
DASH Eating Plan  DASH stands for "Dietary Approaches to Stop Hypertension." The DASH eating plan is a healthy eating plan that has been shown to reduce high blood pressure (hypertension). Additional health benefits may include reducing the risk of type 2 diabetes mellitus, heart disease, and stroke. The DASH eating plan may also help with weight loss.  WHAT DO I NEED TO KNOW ABOUT THE DASH EATING PLAN?  For the DASH eating plan, you will follow these general guidelines:  · Choose foods with a percent daily value for sodium of less than 5% (as listed on the food label).  · Use salt-free seasonings or herbs instead of table salt or sea salt.  · Check with your health care provider or pharmacist before using salt substitutes.  · Eat lower-sodium products, often labeled as "lower sodium" or "no salt added."  · Eat fresh foods.  · Eat more vegetables, fruits, and low-fat dairy products.  · Choose whole grains. Look for the word "whole" as the first word in the ingredient list.  · Choose fish and skinless chicken or turkey more often than red meat. Limit fish, poultry, and meat to 6 oz (170 g) each day.  · Limit sweets, desserts, sugars, and sugary drinks.  · Choose heart-healthy fats.  · Limit cheese to 1 oz (28 g) per day.  · Eat more home-cooked food and less restaurant, buffet, and fast food.  · Limit fried foods.  · Cook foods using methods other than frying.  · Limit canned vegetables. If you do use them, rinse them well to decrease the sodium.  · When eating at a restaurant, ask that your food be prepared with less salt, or no salt if possible.  WHAT FOODS CAN I EAT?  Seek help from a dietitian for individual calorie needs.  Grains  Whole grain or whole wheat bread. Brown rice. Whole grain or whole wheat pasta. Quinoa, bulgur, and whole grain cereals. Low-sodium cereals. Corn or whole wheat flour tortillas. Whole grain cornbread. Whole grain crackers. Low-sodium crackers.  Vegetables  Fresh or frozen vegetables  (raw, steamed, roasted, or grilled). Low-sodium or reduced-sodium tomato and vegetable juices. Low-sodium or reduced-sodium tomato sauce and paste. Low-sodium or reduced-sodium canned vegetables.   Fruits  All fresh, canned (in natural juice), or frozen fruits.  Meat and Other Protein Products  Ground beef (85% or leaner), grass-fed beef, or beef trimmed of fat. Skinless chicken or turkey. Ground chicken or turkey. Pork trimmed of fat. All fish and seafood. Eggs. Dried beans, peas, or lentils. Unsalted nuts and seeds. Unsalted canned beans.  Dairy  Low-fat dairy products, such as skim or 1% milk, 2% or reduced-fat cheeses, low-fat ricotta or cottage cheese, or plain low-fat yogurt. Low-sodium or reduced-sodium cheeses.  Fats and Oils  Tub margarines without trans fats. Light or reduced-fat mayonnaise and salad dressings (reduced sodium). Avocado. Safflower, olive, or canola oils. Natural peanut or almond butter.  Other  Unsalted popcorn and pretzels.  The items listed above may not be a complete list of recommended foods or beverages. Contact your dietitian for more options.  WHAT FOODS ARE NOT RECOMMENDED?  Grains  White bread. White pasta. White rice. Refined cornbread. Bagels and croissants. Crackers that contain trans fat.  Vegetables  Creamed or fried vegetables. Vegetables in a cheese sauce. Regular canned vegetables. Regular canned tomato sauce and paste. Regular tomato and vegetable juices.  Fruits  Dried fruits. Canned fruit in light or heavy syrup. Fruit juice.  Meat and Other Protein   Products  Fatty cuts of meat. Ribs, chicken wings, bacon, sausage, bologna, salami, chitterlings, fatback, hot dogs, bratwurst, and packaged luncheon meats. Salted nuts and seeds. Canned beans with salt.  Dairy  Whole or 2% milk, cream, half-and-half, and cream cheese. Whole-fat or sweetened yogurt. Full-fat cheeses or blue cheese. Nondairy creamers and whipped toppings. Processed cheese, cheese spreads, or cheese  curds.  Condiments  Onion and garlic salt, seasoned salt, table salt, and sea salt. Canned and packaged gravies. Worcestershire sauce. Tartar sauce. Barbecue sauce. Teriyaki sauce. Soy sauce, including reduced sodium. Steak sauce. Fish sauce. Oyster sauce. Cocktail sauce. Horseradish. Ketchup and mustard. Meat flavorings and tenderizers. Bouillon cubes. Hot sauce. Tabasco sauce. Marinades. Taco seasonings. Relishes.  Fats and Oils  Butter, stick margarine, lard, shortening, ghee, and bacon fat. Coconut, palm kernel, or palm oils. Regular salad dressings.  Other  Pickles and olives. Salted popcorn and pretzels.  The items listed above may not be a complete list of foods and beverages to avoid. Contact your dietitian for more information.  WHERE CAN I FIND MORE INFORMATION?  National [employer], [employer], [employer] Institute: www.nhlbi.nih.gov/health/health-topics/topics/dash/  Document Released: 03/25/2011 Document Revised: 04/10/2013 Document Reviewed: 02/07/2013  ExitCare® Patient Information ©2015 [employer]. This information is not intended to replace advice given to you by your health care provider. Make sure you discuss any questions you have with your health care provider.

## 2013-10-17 NOTE — Progress Notes (Signed)
Patient ID: Cindy Taylor, female   DOB: 1946-01-21, 68 y.o.   MRN: [id number]  CC: 3 month follow up DM/HTN  HPI: Patient presents today for a 3 month follow up.  Patient is compliant with medication use. Denies c/o today.     No Known Allergies Past Medical History  Diagnosis Date  . Diabetes mellitus     mellitus? x 9 years  . HTN (hypertension)     x8 years  . Hepatitis C   . Hyperlipidemia    Current Outpatient Prescriptions on File Prior to Visit  Medication Sig Dispense Refill  . aspirin 81 MG tablet Take 81 mg by mouth daily.      . Blood Gluc Meter Disp-Strips (BLOOD GLUCOSE METER DISPOSABLE) DEVI 1 Units by Does not apply route as needed.  1 each  5  . Blood Glucose Monitoring Suppl (ONE TOUCH ULTRA MINI) W/DEVICE KIT       . Fingerstix Lancets MISC 1 each by Does not apply route 3 (three) times daily before meals.  100 each  3  . glipiZIDE (GLUCOTROL) 5 MG tablet take 2 tablets by mouth twice a day before meals  60 tablet  1  . Insulin Pen Needle 31G X 8 MM MISC Use 1 pen needle once per day or as directed  100 each  12  . LANTUS SOLOSTAR 100 UNIT/ML Solostar Pen INJECT 20 UNITS INTO THE SKIN AT BEDTIME  15 mL  3  . lisinopril-hydrochlorothiazide (PRINZIDE,ZESTORETIC) 10-12.5 MG per tablet Take 1 tablet by mouth daily.  90 tablet  3  . metFORMIN (GLUCOPHAGE) 850 MG tablet Take 1 tablet (850 mg total) by mouth 2 (two) times daily with a meal.  180 tablet  3  . metoprolol succinate (TOPROL-XL) 50 MG 24 hr tablet Take 1 tablet (50 mg total) by mouth daily. Take with or immediately following a meal.  90 tablet  3  . ONE TOUCH ULTRA TEST test strip       . Oyster Shell Calcium 500 MG TABS Take 1 tablet (1,250 mg total) by mouth 2 (two) times daily.  60 tablet  3  . pravastatin (PRAVACHOL) 20 MG tablet Take 1 tablet (20 mg total) by mouth daily.  90 tablet  3  . metoprolol succinate (TOPROL-XL) 50 MG 24 hr tablet take 1 tablet by mouth once daily with food  90 tablet  3   No  current facility-administered medications on file prior to visit.   Family History  Problem Relation Age of Onset  . Stroke Mother    History   Social History  . Marital Status: Single    Spouse Name: N/A    Number of Children: N/A  . Years of Education: N/A   Occupational History  . Not on file.   Social History Main Topics  . Smoking status: Never Smoker   . Smokeless tobacco: Current User    Types: Snuff  . Alcohol Use: No  . Drug Use: No  . Sexual Activity: Not on file   Other Topics Concern  . Not on file   Social History Narrative  . No narrative on file   Review of Systems  All other systems reviewed and are negative.    Objective:   Filed Vitals:   10/17/13 1049  BP: 112/69  Pulse: 70  Temp: 98.2 F (36.8 C)  Resp: 14    Physical Exam: Constitutional: Patient appears well-developed and well-nourished. No distress.  Eyes: Conjunctivae and EOM are normal.  PERRLA, no scleral icterus. Neck: Normal ROM. Neck supple. No JVD. No tracheal deviation. No thyromegaly. CVS: RRR, S1/S2 +, no murmurs, no gallops, no carotid bruit.  Pulmonary: Effort and breath sounds normal, no stridor, rhonchi, wheezes, rales.  Abdominal: Soft. BS +,  no distension, tenderness, rebound or guarding.  Musculoskeletal: Normal range of motion. No edema and no tenderness.  Lymphadenopathy: No lymphadenopathy noted, cervical, Neuro: Alert. Normal reflexes, muscle tone coordination. Skin: Skin is warm and dry. No rash noted. Not diaphoretic. No erythema. No pallor. Psychiatric: Normal mood and affect. Behavior, judgment, thought content normal.  Lab Results  Component Value Date   WBC 8.6 07/24/2013   HGB 12.9 07/24/2013   HCT 38.6 07/24/2013   MCV 81.3 07/24/2013   PLT 167 07/24/2013   Lab Results  Component Value Date   CREATININE 0.81 07/24/2013   BUN 14 07/24/2013   NA 140 07/24/2013   K 4.0 07/24/2013   CL 99 07/24/2013   CO2 28 07/24/2013    Lab Results  Component Value Date    HGBA1C 6.1 10/17/2013   Lipid Panel     Component Value Date/Time   CHOL 170 07/24/2013 1741   TRIG 160* 07/24/2013 1741   HDL 45 07/24/2013 1741   CHOLHDL 3.8 07/24/2013 1741   VLDL 32 07/24/2013 1741   LDLCALC 93 07/24/2013 1741       Assessment and plan:   Cindy Taylor was seen today for follow-up, diabetes and hypertension.  Diagnoses and associated orders for this visit:  Type II or unspecified type diabetes mellitus without mention of complication, not stated as uncontrolled - Glucose (CBG) - HgB A1c-6.1 Current medication therapy, no changes - glipiZIDE (GLUCOTROL) 5 MG tablet; take 2 tablets by mouth twice a day before meals - metFORMIN (GLUCOPHAGE) 850 MG tablet; Take 1 tablet (850 mg total) by mouth 2 (two) times daily with a meal.  Essential hypertension, benign Continue current therapy, no changes - metoprolol succinate (TOPROL-XL) 50 MG 24 hr tablet; Take 1 tablet (50 mg total) by mouth daily. Take with or immediately following a meal. - lisinopril-hydrochlorothiazide (PRINZIDE,ZESTORETIC) 10-12.5 MG per tablet; Take 1 tablet by mouth daily.  HLD (hyperlipidemia) - pravastatin (PRAVACHOL) 20 MG tablet; Take 1 tablet (20 mg total) by mouth daily.   Return in about 3 months (around 01/17/2014) for DM/HTN.     Cindy Manning, NP-C [facility] [phone] 10/23/2013, 12:45 PM

## 2013-10-17 NOTE — Progress Notes (Signed)
Patient presents for 3 month F/U for DM and HTN and Hyperlipidemia Uses snuff 2-3 times dailyfor 50 years and would like to quit.

## 2013-10-20 ENCOUNTER — Other Ambulatory Visit: Payer: Self-pay | Admitting: Internal Medicine

## 2013-11-02 ENCOUNTER — Telehealth: Payer: Self-pay | Admitting: Internal Medicine

## 2013-11-02 NOTE — Telephone Encounter (Signed)
Pt called requesting med refill for cholesterol medication. pls contact pt

## 2013-11-05 ENCOUNTER — Telehealth: Payer: Self-pay | Admitting: Emergency Medicine

## 2013-11-07 ENCOUNTER — Telehealth: Payer: Self-pay | Admitting: Internal Medicine

## 2013-11-07 NOTE — Telephone Encounter (Signed)
Pt following up from prior req of cholesterol medication. Pt. States she is out of meds. Please contact pt.

## 2013-11-29 ENCOUNTER — Telehealth: Payer: Self-pay | Admitting: Internal Medicine

## 2013-11-29 NOTE — Telephone Encounter (Signed)
Pt needs refill for glipicide  Please f/u with Patient

## 2013-11-30 NOTE — Telephone Encounter (Signed)
Returned patient call regarding refill. Left a message for patient that she has 3 refills on her Glipizide and if she has any questions to return our call.Vivia Birmingham, RN

## 2013-12-15 ENCOUNTER — Other Ambulatory Visit: Payer: Self-pay | Admitting: Internal Medicine

## 2013-12-19 ENCOUNTER — Other Ambulatory Visit: Payer: Self-pay | Admitting: Internal Medicine

## 2013-12-31 ENCOUNTER — Other Ambulatory Visit: Payer: Self-pay

## 2013-12-31 DIAGNOSIS — Z1231 Encounter for screening mammogram for malignant neoplasm of breast: Secondary | ICD-10-CM

## 2014-01-08 ENCOUNTER — Ambulatory Visit
Admission: RE | Admit: 2014-01-08 | Discharge: 2014-01-08 | Disposition: A | Discharge status: Home / Self Care | Payer: Medicare Other | Source: Ambulatory Visit

## 2014-01-08 ENCOUNTER — Ambulatory Visit: Payer: Medicare Other

## 2014-01-08 DIAGNOSIS — Z1231 Encounter for screening mammogram for malignant neoplasm of breast: Secondary | ICD-10-CM

## 2014-01-17 ENCOUNTER — Encounter: Payer: Self-pay | Admitting: Internal Medicine

## 2014-01-17 ENCOUNTER — Ambulatory Visit: Payer: Medicare Other | Attending: Internal Medicine | Admitting: Internal Medicine

## 2014-01-17 VITALS — BP 115/69 | HR 74 | Temp 98.4°F | Resp 16 | Ht 63.0 in | Wt 169.6 lb

## 2014-01-17 DIAGNOSIS — E1165 Type 2 diabetes mellitus with hyperglycemia: Secondary | ICD-10-CM | POA: Insufficient documentation

## 2014-01-17 DIAGNOSIS — E119 Type 2 diabetes mellitus without complications: Secondary | ICD-10-CM | POA: Diagnosis present

## 2014-01-17 DIAGNOSIS — Z79899 Other long term (current) drug therapy: Secondary | ICD-10-CM | POA: Insufficient documentation

## 2014-01-17 DIAGNOSIS — E785 Hyperlipidemia, unspecified: Secondary | ICD-10-CM | POA: Insufficient documentation

## 2014-01-17 DIAGNOSIS — Z23 Encounter for immunization: Secondary | ICD-10-CM | POA: Diagnosis not present

## 2014-01-17 DIAGNOSIS — B1920 Unspecified viral hepatitis C without hepatic coma: Secondary | ICD-10-CM | POA: Diagnosis not present

## 2014-01-17 DIAGNOSIS — Z7982 Long term (current) use of aspirin: Secondary | ICD-10-CM | POA: Diagnosis not present

## 2014-01-17 DIAGNOSIS — Z0000 Encounter for general adult medical examination without abnormal findings: Secondary | ICD-10-CM

## 2014-01-17 DIAGNOSIS — I10 Essential (primary) hypertension: Secondary | ICD-10-CM | POA: Diagnosis not present

## 2014-01-17 LAB — POCT GLYCOSYLATED HEMOGLOBIN (HGB A1C): HEMOGLOBIN A1C: 6.1

## 2014-01-17 LAB — GLUCOSE, POCT (MANUAL RESULT ENTRY): POC Glucose: 95 mg/dl (ref 70–99)

## 2014-01-17 MED ORDER — ZOSTER VACCINE LIVE 19400 UNT/0.65ML ~~LOC~~ SOLR: 0.6500 mL | Freq: Once | SUBCUTANEOUS | Status: DC

## 2014-01-17 MED ORDER — GLIPIZIDE ER 5 MG PO TB24: 5.0000 mg | ORAL_TABLET | Freq: Every day | ORAL | Status: DC

## 2014-01-17 NOTE — Patient Instructions (Signed)
Insulin Glargine injection °What is this medicine? °INSULIN GLARGINE (IN su lin GLAR geen) is a human-made form of insulin. This drug lowers the amount of sugar in your blood. It is a long-acting insulin that is usually given once a day. °This medicine may be used for other purposes; ask your health care provider or pharmacist if you have questions. °COMMON BRAND NAME(S): Lantus, Lantus SoloStar, Toujeo SoloStar °What should I tell my health care provider before I take this medicine? °They need to know if you have any of these conditions: °-episodes of hypoglycemia °-kidney disease °-liver disease °-an unusual or allergic reaction to insulin, metacresol, other medicines, foods, dyes, or preservatives °-pregnant or trying to get pregnant °-breast-feeding °How should I use this medicine? °This medicine is for injection under the skin. Use this medicine at the same time each day. Use exactly as directed. This insulin should never be mixed in the same syringe with other insulins before injection. Do not vigorously shake before use. You will be taught how to use this medicine and how to adjust doses for activities and illness. Do not use more insulin than prescribed. °Always check the appearance of your insulin before using it. This medicine should be clear and colorless like water. Do not use it if it is cloudy, thickened, colored, or has solid particles in it. °It is important that you put your used needles and syringes in a special sharps container. Do not put them in a trash can. If you do not have a sharps container, call your pharmacist or healthcare provider to get one. °Talk to your pediatrician regarding the use of this medicine in children. Special care may be needed. °Overdosage: If you think you have taken too much of this medicine contact a poison control center or emergency room at once. °NOTE: This medicine is only for you. Do not share this medicine with others. °What if I miss a dose? °It is important  not to miss a dose. Your health care professional or doctor should discuss a plan for missed doses with you. If you do miss a dose, follow their plan. Do not take double doses. °What may interact with this medicine? °-other medicines for diabetes °Many medications may cause an increase or decrease in blood sugar, these include: °-alcohol containing beverages °-aspirin and aspirin-like drugs °-chloramphenicol °-chromium °-diuretics °-female hormones, like estrogens or progestins and birth control pills °-heart medicines °-isoniazid °-female hormones or anabolic steroids °-medicines for weight loss °-medicines for allergies, asthma, cold, or cough °-medicines for mental problems °-medicines called MAO Inhibitors like Nardil, Parnate, Marplan, Eldepryl °-niacin °-NSAIDs, medicines for pain and inflammation, like ibuprofen or naproxen °-pentamidine °-phenytoin °-probenecid °-quinolone antibiotics like ciprofloxacin, levofloxacin, ofloxacin °-some herbal dietary supplements °-steroid medicines like prednisone or cortisone °-thyroid medicine °Some medications can hide the warning symptoms of low blood sugar. You may need to monitor your blood sugar more closely if you are taking one of these medications. These include: °-beta-blockers such as atenolol, metoprolol, propranolol °-clonidine °-guanethidine °-reserpine °This list may not describe all possible interactions. Give your health care provider a list of all the medicines, herbs, non-prescription drugs, or dietary supplements you use. Also tell them if you smoke, drink alcohol, or use illegal drugs. Some items may interact with your medicine. °What should I watch for while using this medicine? °Visit your health care professional or doctor for regular checks on your progress. °A test called the HbA1C (A1C) will be monitored. This is a simple blood test. It measures your   blood sugar control over the last 2 to 3 months. You will receive this test every 3 to 6  months. °Learn how to check your blood sugar. Learn the symptoms of low and high blood sugar and how to manage them. °Always carry a quick-source of sugar with you in case you have symptoms of low blood sugar. Examples include hard sugar candy or glucose tablets. Make sure others know that you can choke if you eat or drink when you develop serious symptoms of low blood sugar, such as seizures or unconsciousness. They must get medical help at once. °Tell your doctor or health care professional if you have high blood sugar. You might need to change the dose of your medicine. If you are sick or exercising more than usual, you might need to change the dose of your medicine. °Do not skip meals. Ask your doctor or health care professional if you should avoid alcohol. Many nonprescription cough and cold products contain sugar or alcohol. These can affect blood sugar. °Make sure that you have the right kind of syringe for the type of insulin you use. Try not to change the brand and type of insulin or syringe unless your health care professional or doctor tells you to. Switching insulin brand or type can cause dangerously high or low blood sugar. Always keep an extra supply of insulin, syringes, and needles on hand. Use a syringe one time only. Throw away syringe and needle in a closed container to prevent accidental needle sticks. °Insulin pens and cartridges should never be shared. Even if the needle is changed, sharing may result in passing of viruses like hepatitis or HIV. °Wear a medical ID bracelet or chain, and carry a card that describes your disease and details of your medicine and dosage times. °What side effects may I notice from receiving this medicine? °Side effects that you should report to your health care professional or doctor as soon as possible: °-allergic reactions like skin rash, itching or hives, swelling of the face, lips, or tongue °-breathing problems °-signs and symptoms of high blood sugar such as  dizziness, dry mouth, dry skin, fruity breath, nausea, stomach pain, increased hunger or thirst, increased urination °-signs and symptoms of low blood sugar such as feeling anxious, confusion, dizziness, increased hunger, unusually weak or tired, sweating, shakiness, cold, irritable, headache, blurred vision, fast heartbeat, loss of consciousness °Side effects that usually do not require medical attention (report to your health care professional or doctor if they continue or are bothersome): °-increase or decrease in fatty tissue under the skin due to overuse of a particular injection site °-itching, burning, swelling, or rash at site where injected °This list may not describe all possible side effects. Call your doctor for medical advice about side effects. You may report side effects to FDA at 1-800-FDA-1088. °Where should I keep my medicine? °Keep out of the reach of children. °Store unopened vials in a refrigerator between 2 and 8 degrees C (36 and 46 degrees F). Do not freeze or use if the insulin has been frozen. Opened vials (vials currently in use) may be stored in the refrigerator or at room temperature, at approximately 25 degrees C (77 degrees F) or cooler. Keeping your insulin at room temperature decreases the amount of pain during injection. Once opened, your insulin can be used for 28 days. After 28 days, the vial should be thrown away. °Store unopened pen-injector cartridges in a refrigerator between 2 and 8 degrees C (36 and 46 degrees F.)   Do not freeze or use if the insulin has been frozen. Insulin cartridges inserted into the [employer] system should be kept at room temperature, approximately 25 degrees C (77 degrees F) or cooler. Do not store in the refrigerator. Once inserted into the [employer] system, the insulin can be used for 28 days. After 28 days, the cartridge should be thrown away. °Protect from light and excessive heat. Throw away any unused medicine after the expiration date or after the  specified time for room temperature storage has passed. °NOTE: This sheet is a summary. It may not cover all possible information. If you have questions about this medicine, talk to your doctor, pharmacist, or health care provider. °© 2015, [employer]. (2013-06-14 10:13:56) ° °

## 2014-01-17 NOTE — Progress Notes (Signed)
Patient ID: Cindy Taylor, female   DOB: 13-Oct-1945, 68 y.o.   MRN: [id number]  CC: HTN, DM  HPI:  Patient presents to clinic today for a follow up of DM and hypertension.  She has continued to take her medication on a daily basis without complications. She currently checks her blood sugar once daily that are usually normal.  She is requesting refills and is interested in receiving the Zostavax.    No Known Allergies Past Medical History  Diagnosis Date  . Diabetes mellitus     mellitus? x 9 years  . HTN (hypertension)     x8 years  . Hepatitis C   . Hyperlipidemia    Current Outpatient Prescriptions on File Prior to Visit  Medication Sig Dispense Refill  . aspirin 81 MG tablet Take 81 mg by mouth daily.      . Blood Gluc Meter Disp-Strips (BLOOD GLUCOSE METER DISPOSABLE) DEVI 1 Units by Does not apply route as needed.  1 each  5  . Blood Glucose Monitoring Suppl (ONE TOUCH ULTRA MINI) W/DEVICE KIT       . Fingerstix Lancets MISC 1 each by Does not apply route 3 (three) times daily before meals.  100 each  3  . glipiZIDE (GLUCOTROL) 5 MG tablet take 2 tablets by mouth twice a day  60 tablet  3  . Insulin Pen Needle 31G X 8 MM MISC Use 1 pen needle once per day or as directed  100 each  12  . LANTUS SOLOSTAR 100 UNIT/ML Solostar Pen INJECT 20 UNITS INTO THE SKIN AT BEDTIME  15 mL  3  . lisinopril-hydrochlorothiazide (PRINZIDE,ZESTORETIC) 10-12.5 MG per tablet Take 1 tablet by mouth daily.  90 tablet  3  . metFORMIN (GLUCOPHAGE) 850 MG tablet Take 1 tablet (850 mg total) by mouth 2 (two) times daily with a meal.  180 tablet  3  . metoprolol succinate (TOPROL-XL) 50 MG 24 hr tablet take 1 tablet by mouth once daily with food  90 tablet  3  . ONE TOUCH ULTRA TEST test strip       . Oyster Shell Calcium 500 MG TABS Take 1 tablet (1,250 mg total) by mouth 2 (two) times daily.  60 tablet  3  . pravastatin (PRAVACHOL) 20 MG tablet Take 1 tablet (20 mg total) by mouth daily.  90 tablet  3  .  metoprolol succinate (TOPROL-XL) 50 MG 24 hr tablet Take 1 tablet (50 mg total) by mouth daily. Take with or immediately following a meal.  90 tablet  3   No current facility-administered medications on file prior to visit.   Family History  Problem Relation Age of Onset  . Stroke Mother    History   Social History  . Marital Status: Single    Spouse Name: N/A    Number of Children: N/A  . Years of Education: N/A   Occupational History  . Not on file.   Social History Main Topics  . Smoking status: Never Smoker   . Smokeless tobacco: Current User    Types: Snuff  . Alcohol Use: No  . Drug Use: No  . Sexual Activity: Not on file   Other Topics Concern  . Not on file   Social History Narrative  . No narrative on file    Review of Systems: Constitutional: Negative for fever, chills, diaphoresis, activity change, appetite change and fatigue. HENT: Negative for ear pain, nosebleeds, congestion, facial swelling, rhinorrhea, neck pain, neck stiffness and  ear discharge.  Eyes: Negative for pain, discharge, redness, itching and visual disturbance. Respiratory: Negative for cough, choking, chest tightness, shortness of breath, wheezing and stridor.  Cardiovascular: Negative for chest pain, palpitations and leg swelling. Gastrointestinal: Negative for abdominal distention. Genitourinary: Negative for dysuria, urgency, frequency, hematuria, flank pain, decreased urine volume, difficulty urinating and dyspareunia.  Musculoskeletal: Negative for back pain, joint swelling, arthralgias and gait problem. Neurological: Negative for dizziness, tremors, seizures, syncope, facial asymmetry, speech difficulty, weakness, light-headedness, numbness and headaches.  Hematological: Negative for adenopathy. Does not bruise/bleed easily. Psychiatric/Behavioral: Negative for hallucinations, behavioral problems, confusion, dysphoric mood, decreased concentration and agitation.    Objective:    Filed Vitals:   01/17/14 1710  BP: 115/69  Pulse: 74  Temp: 98.4 F (36.9 C)  Resp: 16    Physical Exam: Constitutional: Patient appears well-developed and well-nourished. No distress. Neck: Normal ROM. Neck supple. No JVD. No tracheal deviation. No thyromegaly. CVS: RRR, S1/S2 +, no murmurs, no gallops, no carotid bruit.  Pulmonary: Effort and breath sounds normal, no stridor, rhonchi, wheezes, rales.  Abdominal: Soft. BS +,  no distension, tenderness, rebound or guarding.  Musculoskeletal: Normal range of motion. No edema and no tenderness.  Lymphadenopathy: No lymphadenopathy noted, cervical Skin: Skin is warm and dry. No rash noted. Not diaphoretic. No erythema. No pallor. Psychiatric: Normal mood and affect. Behavior, judgment, thought content normal.  Lab Results  Component Value Date   WBC 8.6 07/24/2013   HGB 12.9 07/24/2013   HCT 38.6 07/24/2013   MCV 81.3 07/24/2013   PLT 167 07/24/2013   Lab Results  Component Value Date   CREATININE 0.81 07/24/2013   BUN 14 07/24/2013   NA 140 07/24/2013   K 4.0 07/24/2013   CL 99 07/24/2013   CO2 28 07/24/2013    Lab Results  Component Value Date   HGBA1C 6.1 01/17/2014   Lipid Panel     Component Value Date/Time   CHOL 170 07/24/2013 1741   TRIG 160* 07/24/2013 1741   HDL 45 07/24/2013 1741   CHOLHDL 3.8 07/24/2013 1741   VLDL 32 07/24/2013 1741   LDLCALC 93 07/24/2013 1741       Assessment and plan:   Barabara was seen today for follow-up, diabetes and hypertension.  Diagnoses and associated orders for this visit:  Type 2 diabetes mellitus with hyperglycemia - Glucose (CBG) - HgB A1c - glipiZIDE (GLUCOTROL XL) 5 MG 24 hr tablet; Take 1 tablet (5 mg total) by mouth daily with breakfast.  Need for prophylactic vaccination against Streptococcus pneumoniae (pneumococcus) - Pneumococcal conjugate vaccine 13-valent  Need for prophylactic vaccination and inoculation against influenza - Flu Vaccine QUAD 36+ mos PF IM (Fluarix Quad  PF) Influenza injection received.  Explained side effects and contraindications to patient. Information sheet given to patient.  Preventative health care - zoster vaccine live, PF, (ZOSTAVAX) 93235 UNT/0.65ML injection; Inject 19,400 Units into the skin once. Sent to pharmacy for patient to pick up. Advised patient to wait at least 4 weeks after today before getting vaccine   Follow up in 3 months for DM/HTN      Chari Manning, NP-C [facility] [phone] 01/17/2014, 5:28 PM

## 2014-01-17 NOTE — Progress Notes (Signed)
Patient presents for 3 month f/u T2DM Wants to discuss shingles vaccination

## 2014-02-27 ENCOUNTER — Other Ambulatory Visit: Payer: Self-pay | Admitting: Internal Medicine

## 2014-03-01 ENCOUNTER — Other Ambulatory Visit: Payer: Self-pay

## 2014-03-01 MED ORDER — INSULIN GLARGINE 100 UNIT/ML SOLOSTAR PEN: PEN_INJECTOR | SUBCUTANEOUS | Status: DC

## 2014-04-02 ENCOUNTER — Telehealth: Payer: Self-pay | Admitting: Internal Medicine

## 2014-04-02 NOTE — Telephone Encounter (Signed)
Pt. Called stating that she has had a bad cough for 3 days, pt. Also states that she feels sick... Pt would like to get some medication to treat. Please f/u with pt.

## 2014-04-09 NOTE — Telephone Encounter (Signed)
Called and left VM to return my call.

## 2014-04-15 ENCOUNTER — Telehealth: Payer: Self-pay | Admitting: Internal Medicine

## 2014-04-15 NOTE — Telephone Encounter (Signed)
Patient calling to request med refill for the following medication. glipiZIDE (GLUCOTROL XL) 5 MG 24 hr tablet. Patient uses [facility] on [address]. Please assist.

## 2014-04-23 ENCOUNTER — Telehealth: Payer: Self-pay | Admitting: Emergency Medicine

## 2014-04-23 ENCOUNTER — Other Ambulatory Visit: Payer: Self-pay | Admitting: Emergency Medicine

## 2014-04-23 DIAGNOSIS — E1165 Type 2 diabetes mellitus with hyperglycemia: Secondary | ICD-10-CM

## 2014-04-23 MED ORDER — GLIPIZIDE ER 5 MG PO TB24: 5.0000 mg | ORAL_TABLET | Freq: Every day | ORAL | Status: DC

## 2014-04-23 NOTE — Telephone Encounter (Signed)
Left message that medication Glipizide was e-scribed to [facility]

## 2014-05-13 ENCOUNTER — Other Ambulatory Visit: Payer: Self-pay | Admitting: Internal Medicine

## 2014-05-13 NOTE — Telephone Encounter (Signed)
Pt has refills, need to call pharmacy. Pt aware

## 2014-05-13 NOTE — Telephone Encounter (Signed)
Patient has called in today to schedule 3 month f/u and to request a medication refill for glipiZIDE (GLUCOTROL XL) 5 MG 24 hr tablet ; please f/u with patient

## 2014-05-20 ENCOUNTER — Ambulatory Visit: Payer: Self-pay | Admitting: Internal Medicine

## 2014-05-23 ENCOUNTER — Ambulatory Visit: Payer: Medicare Other | Attending: Internal Medicine | Admitting: Internal Medicine

## 2014-05-23 ENCOUNTER — Encounter: Payer: Self-pay | Admitting: Internal Medicine

## 2014-05-23 VITALS — BP 122/80 | HR 80 | Temp 98.0°F | Resp 16

## 2014-05-23 DIAGNOSIS — I10 Essential (primary) hypertension: Secondary | ICD-10-CM | POA: Insufficient documentation

## 2014-05-23 DIAGNOSIS — Z8619 Personal history of other infectious and parasitic diseases: Secondary | ICD-10-CM | POA: Insufficient documentation

## 2014-05-23 DIAGNOSIS — E118 Type 2 diabetes mellitus with unspecified complications: Secondary | ICD-10-CM | POA: Diagnosis not present

## 2014-05-23 DIAGNOSIS — E785 Hyperlipidemia, unspecified: Secondary | ICD-10-CM | POA: Insufficient documentation

## 2014-05-23 DIAGNOSIS — E089 Diabetes mellitus due to underlying condition without complications: Secondary | ICD-10-CM

## 2014-05-23 LAB — POCT GLYCOSYLATED HEMOGLOBIN (HGB A1C): HEMOGLOBIN A1C: 6.5

## 2014-05-23 LAB — GLUCOSE, POCT (MANUAL RESULT ENTRY): POC Glucose: 76 mg/dl (ref 70–99)

## 2014-05-23 MED ORDER — GLIPIZIDE ER 5 MG PO TB24: 5.0000 mg | ORAL_TABLET | Freq: Every day | ORAL | Status: DC

## 2014-05-23 MED ORDER — METOPROLOL SUCCINATE ER 50 MG PO TB24: 50.0000 mg | ORAL_TABLET | Freq: Every day | ORAL | Status: DC

## 2014-05-23 NOTE — Progress Notes (Signed)
Patient ID: Cindy Taylor, female   DOB: [birth year], 69 y.o.   MRN: [id number] 1. HTN: Medication: Toprol and Prinzide. Did take medication today.  Takes daily without skipped doses Home BP monitoring: does not check Positive ROS none Negative EVO:[id number], chest pain, SOB, palpitations, headaches, edema   2. DM2:  Medication: 20 units of Lantus, Metformin, and Glipizide. Without skipped doses, no side effects  Home CBG monitoring: fasting sporadically, not often.  Hypoglycemic event: none Positive ROS: polyuria Negative EXH:[id number] vision, neuropathy, polyphagia  3. HLD: Medication: Pravastatin  Tolerance: well, denies muscle aches Negative ELF:[id number]   Social History reviewed: Smoker Never Exercise Sporadically  Physical Exam  Constitutional: She is oriented to person, place, and time.  Cardiovascular: Normal rate, regular rhythm and normal heart sounds.   Pulmonary/Chest: Effort normal and breath sounds normal.  Musculoskeletal: Normal range of motion. She exhibits no edema.  Neurological: She is alert and oriented to person, place, and time.  Skin: Skin is warm and dry.   Cindy Taylor was seen today for follow-up.  Diagnoses and all orders for this visit:  Diabetes mellitus due to underlying condition without complications Orders: -     Glucose (CBG) -     HgB A1c -     CBC; Future -     COMPLETE METABOLIC PANEL WITH GFR; Future -     Refill glipiZIDE (GLUCOTROL XL) 5 MG 24 hr tablet; Take 1 tablet (5 mg total) by mouth daily with breakfast. Patients diabetes is well control as evidence by consistently low a1c.  Patient will continue with current therapy and continue to make necessary lifestyle changes.  Reviewed foot care, diet, exercise, annual health maintenance with patient.   Essential hypertension, benign Orders: -     Refill metoprolol succinate (TOPROL-XL) 50 MG 24 hr tablet; Take 1 tablet (50 mg total) by mouth daily. Take with or immediately following  a meal. Patient blood pressure is stable and may continue on current medication.  Education on diet, exercise, and modifiable risk factors discussed. Will obtain appropriate labs as needed. Will follow up in 3-6 months.   HLD (hyperlipidemia) Orders: -     Lipid panel; Future Education provided on proper lifestyle changes in order to lower cholesterol. Patient advised to maintain healthy weight and to keep total fat intake at 25-35% of total calories and carbohydrates 50-60% of total daily calories. Explained how high cholesterol places patient at risk for heart disease. Patient placed on appropriate medication and repeat labs in 6 months   Return in about 1 week (around 05/30/2014) for Lab Visit and 3 mo PCP , DM/HTN.  Chari Manning, NP 06/03/2014 9:05 PM

## 2014-05-23 NOTE — Progress Notes (Signed)
Patient states she is here for her routine check up Does not need any refills at this time Did ask about checking her cholesterol but patient is not fasting today

## 2014-05-27 ENCOUNTER — Ambulatory Visit: Payer: Medicare Other | Attending: Internal Medicine

## 2014-05-27 DIAGNOSIS — E785 Hyperlipidemia, unspecified: Secondary | ICD-10-CM

## 2014-05-27 LAB — LIPID PANEL
CHOLESTEROL: 148 mg/dL (ref 0–200)
HDL: 46 mg/dL (ref >39)
LDL CALC: 83 mg/dL (ref 0–99)
Total CHOL/HDL Ratio: 3.2 Ratio
Triglycerides: 94 mg/dL (ref <150)
VLDL: 19 mg/dL (ref 0–40)

## 2014-05-28 ENCOUNTER — Telehealth: Payer: Self-pay | Admitting: *Deleted

## 2014-05-28 NOTE — Telephone Encounter (Signed)
-----   Message from Lance Bosch, NP sent at 05/28/2014 12:21 AM EST ----- Cholesterol looks good, keep up the good work

## 2014-05-28 NOTE — Telephone Encounter (Signed)
Left voice message with normal labs

## 2014-11-03 ENCOUNTER — Other Ambulatory Visit: Payer: Self-pay | Admitting: Internal Medicine

## 2014-11-22 ENCOUNTER — Other Ambulatory Visit: Payer: Self-pay | Admitting: Internal Medicine

## 2014-12-02 ENCOUNTER — Other Ambulatory Visit: Payer: Self-pay | Admitting: Internal Medicine

## 2014-12-02 LAB — HM DIABETES EYE EXAM

## 2014-12-11 ENCOUNTER — Encounter: Payer: Self-pay | Admitting: Internal Medicine

## 2014-12-11 ENCOUNTER — Ambulatory Visit: Payer: Medicare Other | Attending: Internal Medicine | Admitting: Internal Medicine

## 2014-12-11 VITALS — BP 128/75 | HR 74 | Temp 98.0°F | Resp 16 | Ht 63.0 in

## 2014-12-11 DIAGNOSIS — E119 Type 2 diabetes mellitus without complications: Secondary | ICD-10-CM | POA: Diagnosis not present

## 2014-12-11 DIAGNOSIS — B351 Tinea unguium: Secondary | ICD-10-CM | POA: Diagnosis not present

## 2014-12-11 DIAGNOSIS — Z1211 Encounter for screening for malignant neoplasm of colon: Secondary | ICD-10-CM | POA: Diagnosis not present

## 2014-12-11 LAB — POCT GLYCOSYLATED HEMOGLOBIN (HGB A1C): HEMOGLOBIN A1C: 5.8

## 2014-12-11 LAB — GLUCOSE, POCT (MANUAL RESULT ENTRY): POC Glucose: 100 mg/dl — AB (ref 70–99)

## 2014-12-11 MED ORDER — INSULIN GLARGINE 100 UNIT/ML SOLOSTAR PEN: PEN_INJECTOR | SUBCUTANEOUS | Status: DC

## 2014-12-11 NOTE — Progress Notes (Signed)
Patient here for follow up on her diabetes Patient states she does not need any refills at this time

## 2014-12-11 NOTE — Patient Instructions (Signed)
I have sent referrals for podiatry. May try Lamisil in the meantime  I also sent a referral for you to have a colonoscopy.

## 2014-12-11 NOTE — Progress Notes (Signed)
Patient ID: Cindy Taylor, female   DOB: Apr 03, 1946, 69 y.o.   MRN: [id number] SUBJECTIVE: 69 y.o. female for follow up of diabetes, HTN, HLD. Diabetic Review of Systems - medication compliance: compliant all of the time, diabetic diet compliance: compliant all of the time, home glucose monitoring: is performed sporadically, values are usually normal, further diabetic ROS: no polyuria or polydipsia, no chest pain, dyspnea or TIA's, no numbness, tingling or pain in extremities, no unusual visual symptoms, no medication side effects noted.  Other symptoms and concerns: Has had bilateral great toe nails removed twice and not fungus has returned again. She would like to see podiatry.  Current Outpatient Prescriptions  Medication Sig Dispense Refill  . aspirin 81 MG tablet Take 81 mg by mouth daily.    . Blood Gluc Meter Disp-Strips (BLOOD GLUCOSE METER DISPOSABLE) DEVI 1 Units by Does not apply route as needed. 1 each 5  . Blood Glucose Monitoring Suppl (ONE TOUCH ULTRA MINI) W/DEVICE KIT     . Fingerstix Lancets MISC 1 each by Does not apply route 3 (three) times daily before meals. 100 each 3  . glipiZIDE (GLUCOTROL XL) 5 MG 24 hr tablet Take 1 tablet (5 mg total) by mouth daily with breakfast. 90 tablet 3  . Insulin Glargine (LANTUS SOLOSTAR) 100 UNIT/ML Solostar Pen INJECT 20 UNITS INTO THE SKIN AT BEDTIME 15 mL 3  . Insulin Pen Needle 31G X 8 MM MISC Use 1 pen needle once per day or as directed 100 each 12  . lisinopril-hydrochlorothiazide (PRINZIDE,ZESTORETIC) 10-12.5 MG per tablet take 1 tablet by mouth once daily 90 tablet 3  . metFORMIN (GLUCOPHAGE) 850 MG tablet take 1 tablet by mouth twice a day 180 tablet 3  . metoprolol succinate (TOPROL-XL) 50 MG 24 hr tablet Take 1 tablet (50 mg total) by mouth daily. Take with or immediately following a meal. 90 tablet 3  . ONE TOUCH ULTRA TEST test strip     . Oyster Shell Calcium 500 MG TABS Take 1 tablet (1,250 mg total) by mouth 2 (two) times daily.  60 tablet 3  . pravastatin (PRAVACHOL) 20 MG tablet take 1 tablet by mouth once daily 90 tablet 3  . zoster vaccine live, PF, (ZOSTAVAX) 59741 UNT/0.65ML injection Inject 19,400 Units into the skin once. 1 each 0   No current facility-administered medications for this visit.   Review of Systems  Eyes: Negative for blurred vision.  Cardiovascular: Negative.   Genitourinary: Negative for frequency.  Neurological: Negative for dizziness, tingling and headaches.  Endo/Heme/Allergies: Negative for polydipsia.  All other systems reviewed and are negative.  OBJECTIVE: Appearance: alert, well appearing, and in no distress, oriented to person, place, and time and overweight. BP 128/75 mmHg  Pulse 74  Temp(Src) 98 F (36.7 C)  Resp 16  Ht '5\' 3"'  (1.6 m)  SpO2 100%  Physical Exam  Constitutional: She is oriented to person, place, and time.  Cardiovascular: Normal rate, regular rhythm and normal heart sounds.   Pulses:      Dorsalis pedis pulses are 2+ on the right side, and 2+ on the left side.       Posterior tibial pulses are 2+ on the right side, and 2+ on the left side.  Pulmonary/Chest: Effort normal and breath sounds normal.  Musculoskeletal: She exhibits no edema.  Feet:  Right Foot:  Protective Sensation: 10 sites tested.10 sites sensed. Skin Integrity: Negative for skin breakdown.  Left Foot:  Protective Sensation: 10 sites tested. 10 sites  sensed. Skin Integrity: Negative for skin breakdown.  Neurological: She is alert and oriented to person, place, and time.  Skin: Skin is warm and dry.  Psychiatric: She has a normal mood and affect.    ASSESSMENT: Shirlene was seen today for follow-up.  Diagnoses and all orders for this visit:  Type 2 diabetes mellitus without complication -     POCT glucose (manual entry) -     POCT glycosylated hemoglobin (Hb A1C) -     Insulin Glargine (LANTUS SOLOSTAR) 100 UNIT/ML Solostar Pen; INJECT 20 UNITS INTO THE SKIN AT BEDTIME -      Ambulatory referral to Podiatry -     Microalbumin, urine -     Cancel: Basic Metabolic Panel -     Basic Metabolic Panel; Future Patients diabetes is well control as evidence by consistently low a1c.  Patient will continue with current therapy and continue to make necessary lifestyle changes.  Reviewed foot care, diet, exercise, annual health maintenance with patient.   Onychomycosis -     Ambulatory referral to Podiatry She may need nail removal again and then PO treatment may take place/   Colon cancer screening -     Ambulatory referral to Gastroenterology---colon cancer screening I have rewritten script for Zostavax vaccine    Return in about 6 months (around 06/13/2015) for DM/HTN.   Lance Bosch, NP 12/12/2014 9:23 AM

## 2014-12-12 ENCOUNTER — Telehealth: Payer: Self-pay

## 2014-12-12 LAB — MICROALBUMIN, URINE: Microalb, Ur: 0.2 mg/dL (ref <2.0)

## 2014-12-12 NOTE — Telephone Encounter (Signed)
Called and spoke with patient this am She will stop by to pick up her prescription for zostavax and  Will call later today to schedule an appointment to come in for blood work

## 2014-12-13 ENCOUNTER — Ambulatory Visit: Payer: Medicare Other | Attending: Internal Medicine

## 2014-12-13 DIAGNOSIS — E089 Diabetes mellitus due to underlying condition without complications: Secondary | ICD-10-CM

## 2014-12-13 DIAGNOSIS — E119 Type 2 diabetes mellitus without complications: Secondary | ICD-10-CM

## 2014-12-13 LAB — COMPLETE METABOLIC PANEL WITH GFR
ALBUMIN: 4.1 g/dL (ref 3.6–5.1)
ALT: 12 U/L (ref 6–29)
AST: 15 U/L (ref 10–35)
Alkaline Phosphatase: 52 U/L (ref 33–130)
BUN: 14 mg/dL (ref 7–25)
CALCIUM: 9.4 mg/dL (ref 8.6–10.4)
CHLORIDE: 102 mmol/L (ref 98–110)
CO2: 28 mmol/L (ref 20–31)
CREATININE: 0.96 mg/dL (ref 0.50–0.99)
GFR, Est African American: 71 mL/min (ref >=60)
GFR, Est Non African American: 61 mL/min (ref >=60)
Glucose, Bld: 200 mg/dL — ABNORMAL HIGH (ref 65–99)
Potassium: 4.6 mmol/L (ref 3.5–5.3)
Sodium: 142 mmol/L (ref 135–146)
Total Bilirubin: 0.4 mg/dL (ref 0.2–1.2)
Total Protein: 7.1 g/dL (ref 6.1–8.1)

## 2014-12-13 LAB — BASIC METABOLIC PANEL
BUN: 14 mg/dL (ref 7–25)
CALCIUM: 9.4 mg/dL (ref 8.6–10.4)
CO2: 28 mmol/L (ref 20–31)
CREATININE: 0.96 mg/dL (ref 0.50–0.99)
Chloride: 102 mmol/L (ref 98–110)
GLUCOSE: 200 mg/dL — AB (ref 65–99)
Potassium: 4.6 mmol/L (ref 3.5–5.3)
SODIUM: 142 mmol/L (ref 135–146)

## 2014-12-13 LAB — CBC
HEMATOCRIT: 38.8 % (ref 36.0–46.0)
HEMOGLOBIN: 12.7 g/dL (ref 12.0–15.0)
MCH: 26.6 pg (ref 26.0–34.0)
MCHC: 32.7 g/dL (ref 30.0–36.0)
MCV: 81.2 fL (ref 78.0–100.0)
Platelets: 170 10*3/uL (ref 150–400)
RBC: 4.78 MIL/uL (ref 3.87–5.11)
RDW: 15.2 % (ref 11.5–15.5)
WBC: 7 10*3/uL (ref 4.0–10.5)

## 2014-12-17 ENCOUNTER — Telehealth: Payer: Self-pay

## 2014-12-17 NOTE — Telephone Encounter (Signed)
-----   Message from Lance Bosch, NP sent at 12/17/2014 10:32 AM EDT ----- Labs are within normal limits

## 2014-12-17 NOTE — Telephone Encounter (Signed)
Patient not available Left message on voice mail to return our call 

## 2014-12-17 NOTE — Telephone Encounter (Signed)
Patient called returning nurse's call to review results  °

## 2014-12-18 NOTE — Telephone Encounter (Signed)
Patient called returning nurse's call, please f/u °

## 2014-12-19 ENCOUNTER — Telehealth: Payer: Self-pay

## 2014-12-19 NOTE — Telephone Encounter (Signed)
Returned patient phone call Patient not available Left message on voice mail to return our call 

## 2014-12-20 NOTE — Telephone Encounter (Signed)
Pt called, returning nurse's call to review results, pt states pm is better due to working in am

## 2015-01-09 ENCOUNTER — Telehealth: Payer: Self-pay | Admitting: Internal Medicine

## 2015-01-09 NOTE — Telephone Encounter (Signed)
Patient called to request a med refill for lisinopril-hydrochlorothiazide (PRINZIDE,ZESTORETIC) 10-12.5 MG per tablet. Please f/u

## 2015-01-13 ENCOUNTER — Other Ambulatory Visit: Payer: Self-pay

## 2015-01-13 DIAGNOSIS — Z1231 Encounter for screening mammogram for malignant neoplasm of breast: Secondary | ICD-10-CM

## 2015-01-14 MED ORDER — LISINOPRIL-HYDROCHLOROTHIAZIDE 10-12.5 MG PO TABS: 1.0000 | ORAL_TABLET | Freq: Every day | ORAL | Status: DC

## 2015-01-15 ENCOUNTER — Ambulatory Visit: Payer: Medicare Other | Admitting: Podiatry

## 2015-01-23 ENCOUNTER — Other Ambulatory Visit: Payer: Self-pay | Admitting: Internal Medicine

## 2015-02-11 ENCOUNTER — Ambulatory Visit
Admission: RE | Admit: 2015-02-11 | Discharge: 2015-02-11 | Disposition: A | Discharge status: Home / Self Care | Payer: Medicare Other | Source: Ambulatory Visit

## 2015-02-11 DIAGNOSIS — Z1231 Encounter for screening mammogram for malignant neoplasm of breast: Secondary | ICD-10-CM

## 2015-02-12 ENCOUNTER — Ambulatory Visit (INDEPENDENT_AMBULATORY_CARE_PROVIDER_SITE_OTHER): Payer: Medicare Other | Admitting: Podiatry

## 2015-02-12 ENCOUNTER — Encounter: Payer: Self-pay | Admitting: Podiatry

## 2015-02-12 VITALS — BP 122/83 | HR 74 | Resp 12

## 2015-02-12 DIAGNOSIS — E119 Type 2 diabetes mellitus without complications: Secondary | ICD-10-CM

## 2015-02-12 DIAGNOSIS — M79673 Pain in unspecified foot: Secondary | ICD-10-CM | POA: Diagnosis not present

## 2015-02-12 DIAGNOSIS — B351 Tinea unguium: Secondary | ICD-10-CM | POA: Diagnosis not present

## 2015-02-12 NOTE — Patient Instructions (Signed)
Diabetes and Foot Care Diabetes may cause you to have problems because of poor blood supply (circulation) to your feet and legs. This may cause the skin on your feet to become thinner, break easier, and heal more slowly. Your skin may become dry, and the skin may peel and crack. You may also have nerve damage in your legs and feet causing decreased feeling in them. You may not notice minor injuries to your feet that could lead to infections or more serious problems. Taking care of your feet is one of the most important things you can do for yourself.  HOME CARE INSTRUCTIONS  Wear shoes at all times, even in the house. Do not go barefoot. Bare feet are easily injured.  Check your feet daily for blisters, cuts, and redness. If you cannot see the bottom of your feet, use a mirror or ask someone for help.  Wash your feet with warm water (do not use hot water) and mild soap. Then pat your feet and the areas between your toes until they are completely dry. Do not soak your feet as this can dry your skin.  Apply a moisturizing lotion or petroleum jelly (that does not contain alcohol and is unscented) to the skin on your feet and to dry, brittle toenails. Do not apply lotion between your toes.  Trim your toenails straight across. Do not dig under them or around the cuticle. File the edges of your nails with an emery board or nail file.  Do not cut corns or calluses or try to remove them with medicine.  Wear clean socks or stockings every day. Make sure they are not too tight. Do not wear knee-high stockings since they may decrease blood flow to your legs.  Wear shoes that fit properly and have enough cushioning. To break in new shoes, wear them for just a few hours a day. This prevents you from injuring your feet. Always look in your shoes before you put them on to be sure there are no objects inside.  Do not cross your legs. This may decrease the blood flow to your feet.  If you find a minor scrape,  cut, or break in the skin on your feet, keep it and the skin around it clean and dry. These areas may be cleansed with mild soap and water. Do not cleanse the area with peroxide, alcohol, or iodine.  When you remove an adhesive bandage, be sure not to damage the skin around it.  If you have a wound, look at it several times a day to make sure it is healing.  Do not use heating pads or hot water bottles. They may burn your skin. If you have lost feeling in your feet or legs, you may not know it is happening until it is too late.  Make sure your health care provider performs a complete foot exam at least annually or more often if you have foot problems. Report any cuts, sores, or bruises to your health care provider immediately. SEEK MEDICAL CARE IF:   You have an injury that is not healing.  You have cuts or breaks in the skin.  You have an ingrown nail.  You notice redness on your legs or feet.  You feel burning or tingling in your legs or feet.  You have pain or cramps in your legs and feet.  Your legs or feet are numb.  Your feet always feel cold. SEEK IMMEDIATE MEDICAL CARE IF:   There is increasing redness,   swelling, or pain in or around a wound.  There is a red line that goes up your leg.  Pus is coming from a wound.  You develop a fever or as directed by your health care provider.  You notice a bad smell coming from an ulcer or wound.   This information is not intended to replace advice given to you by your health care provider. Make sure you discuss any questions you have with your health care provider.   Document Released: 04/02/2000 Document Revised: 12/06/2012 Document Reviewed: 09/12/2012 Elsevier Interactive Patient Education 2016 [employer]  

## 2015-02-12 NOTE — Progress Notes (Signed)
   Subjective:    Patient ID: Cindy Taylor, female    DOB: 10-Jan-1946, 69 y.o.   MRN: [id number]  HPI   This patient presents today concerned about the gradual thickening and color changes in her toenails and right and left feet over the past 3 years. Patient states that the thick toenails cause pressure and  discomfort when she wears shoes and she's having difficulty trimming these nails and is requesting evaluation and debridement of toenails she denies any professional treatment or evaluation for the nails  Patient is a diabetic and denies any history of ulceration, claudication or amputation Patient mentions that she had hep C which was successfully treated with oral medication She works a full-time job as a Scientist, research (medical)  Skin: Positive for color change.       Objective:   Physical Exam  Orientated 3  Vascular: No peripheral edema noted bilaterally DP and PT pulses 2/4 bilaterally Capillary reflex immediate bilaterally  Neurological: Sensation to 10 g monofilament wire intact 5/5 bilaterally Vibratory sensation reactive bilaterally Ankle reflex equal and reactive bilaterally  Dermatological: Texture and turgor within normal limits bilaterally Callus sub-left first MPJ The toenails 1, 2, 5 right and 1, 4, 5 left are discolored, hypertrophic, elongated and tender when palpated  Musculoskeletal: No deformities noted bilaterally There is no restriction ankle, subtalar, midtarsal joints bilaterally      Assessment & Plan:   Assessment: Satisfactory neurovascular status Diabetic without complications Mycotic toenails 6  Plan: Today I reviewede the results of examination with patient  and made her aware that her diabetic foot examination was satisfactory. I discussed treatment options with patient for mycotic toenails including no treatment, topical medication, oral medication and debridement. Today patient is requesting nail debridement. She  had concerned about oral medication because she had a history hepatitis C and are concerned that medication could cause some liver irritation.  Nails 10 are debrided mechanically and electronically without any bleeding  Reappoint 3 months

## 2015-02-17 ENCOUNTER — Telehealth: Payer: Self-pay

## 2015-02-17 NOTE — Telephone Encounter (Signed)
Patient not available Left message on voice mail to return our call 

## 2015-02-17 NOTE — Telephone Encounter (Signed)
-----   Message from Lance Bosch, NP sent at 02/12/2015  4:54 PM EDT ----- Normal mammogram. Repeat in 1 year

## 2015-02-21 NOTE — Telephone Encounter (Signed)
Patient called back, please f/u  °

## 2015-02-24 ENCOUNTER — Telehealth: Payer: Self-pay

## 2015-02-24 NOTE — Telephone Encounter (Signed)
Returned patient phone call Patient not available Left message on voice mail to return our call 

## 2015-02-25 ENCOUNTER — Emergency Department (HOSPITAL_COMMUNITY)
Admission: EM | Admit: 2015-02-25 | Discharge: 2015-02-25 | Disposition: A | Discharge status: Home / Self Care | Payer: Medicare Other | Attending: Emergency Medicine | Admitting: Emergency Medicine

## 2015-02-25 ENCOUNTER — Encounter (HOSPITAL_COMMUNITY): Payer: Self-pay

## 2015-02-25 DIAGNOSIS — Z79899 Other long term (current) drug therapy: Secondary | ICD-10-CM | POA: Diagnosis not present

## 2015-02-25 DIAGNOSIS — Z7982 Long term (current) use of aspirin: Secondary | ICD-10-CM | POA: Diagnosis not present

## 2015-02-25 DIAGNOSIS — Z8619 Personal history of other infectious and parasitic diseases: Secondary | ICD-10-CM | POA: Diagnosis not present

## 2015-02-25 DIAGNOSIS — E785 Hyperlipidemia, unspecified: Secondary | ICD-10-CM | POA: Diagnosis not present

## 2015-02-25 DIAGNOSIS — H02841 Edema of right upper eyelid: Secondary | ICD-10-CM | POA: Diagnosis not present

## 2015-02-25 DIAGNOSIS — H02842 Edema of right lower eyelid: Secondary | ICD-10-CM | POA: Diagnosis not present

## 2015-02-25 DIAGNOSIS — H578 Other specified disorders of eye and adnexa: Secondary | ICD-10-CM | POA: Insufficient documentation

## 2015-02-25 DIAGNOSIS — Z794 Long term (current) use of insulin: Secondary | ICD-10-CM | POA: Insufficient documentation

## 2015-02-25 DIAGNOSIS — H538 Other visual disturbances: Secondary | ICD-10-CM | POA: Diagnosis not present

## 2015-02-25 DIAGNOSIS — E119 Type 2 diabetes mellitus without complications: Secondary | ICD-10-CM | POA: Insufficient documentation

## 2015-02-25 DIAGNOSIS — I10 Essential (primary) hypertension: Secondary | ICD-10-CM | POA: Insufficient documentation

## 2015-02-25 DIAGNOSIS — H5711 Ocular pain, right eye: Secondary | ICD-10-CM

## 2015-02-25 MED ORDER — FLUORESCEIN SODIUM 1 MG OP STRP
1.0000 | ORAL_STRIP | Freq: Once | OPHTHALMIC | Status: AC
  Administered 2015-02-25: 1 via OPHTHALMIC
  Filled 2015-02-25: qty 1

## 2015-02-25 MED ORDER — POLYMYXIN B-TRIMETHOPRIM 10000-0.1 UNIT/ML-% OP SOLN
1.0000 [drp] | OPHTHALMIC | Status: DC
  Filled 2015-02-25: qty 10

## 2015-02-25 MED ORDER — TETRACAINE HCL 0.5 % OP SOLN
2.0000 [drp] | Freq: Once | OPHTHALMIC | Status: AC
  Administered 2015-02-25: 2 [drp] via OPHTHALMIC
  Filled 2015-02-25: qty 2

## 2015-02-25 MED ORDER — ERYTHROMYCIN 5 MG/GM OP OINT
1.0000 "application " | TOPICAL_OINTMENT | Freq: Once | OPHTHALMIC | Status: AC
  Administered 2015-02-25: 1 via OPHTHALMIC
  Filled 2015-02-25: qty 3.5

## 2015-02-25 NOTE — Telephone Encounter (Signed)
Patient returning call. Patient reports that the best time to reach her is Mon-Fri at 4:30pm or later. Additionally, patient states that it is OKAY to leave a detailed message on her answering machine. Thank you, Cindy Taylor, ASA

## 2015-02-25 NOTE — Discharge Instructions (Signed)
°  Please follow with your eye doctor first thing tomorrow   Follow with the eye doctor in the next 24 to 28 hours  Take the erythromycin ointment that you were given today and apply a 1 cm ribbon to the lower eye 6 times a day for 7-10 days.   Wash your hands frequently and try to keep your hands away from the affected eye(s).

## 2015-02-25 NOTE — ED Provider Notes (Signed)
CSN: [id number]     Arrival date & time 02/25/15  1738 History  By signing my name below, I, Meriel Pica, attest that this documentation has been prepared under the direction and in the presence of [employer], PA-C. Electronically Signed: Meriel Pica, ED Scribe. 02/25/2015. 6:59 PM.   Chief Complaint  Patient presents with  . Eye Pain   The history is provided by the patient. No language interpreter was used.   HPI Comments: Cindy Taylor is a 69 y.o. female, with a PMhx of DM, HTN, and HLD, who presents to the Emergency Department complaining of constant, progressively worsening right eye pain and erythema X 2 days. Pt associates clear drainage, swelling to upper and lower right eye lids, and blurry vision. She reports these symptoms began intermittently 1 week ago but have since worsened to be constant. Her symptoms are alleviated in the morning and she denies crusting of her right eye in the mornings but states her symptoms worsen throughout the day. The eye pain is worse with blinking and closing of right eye and she states she feels like something is in her eye. Pt denies wearing contact lenses, pain with EOM, or loss of vision in right eye. Pt attempted to make an appointment with her ophthalmologist Crecencio Mc, MD but his office is closed on Tuesdays.   Past Medical History  Diagnosis Date  . Diabetes mellitus     mellitus? x 9 years  . HTN (hypertension)     x8 years  . Hepatitis C   . Hyperlipidemia    No past surgical history on file. Family History  Problem Relation Age of Onset  . Stroke Mother    Social History  Substance Use Topics  . Smoking status: Never Smoker   . Smokeless tobacco: Current User    Types: Snuff  . Alcohol Use: No   OB History    No data available     Review of Systems A complete 10 system review of systems was obtained and is otherwise negative except at noted in the HPI and PMH. Allergies  Review of patient's allergies  indicates no known allergies.  Home Medications   Prior to Admission medications   Medication Sig Start Date End Date Taking? Authorizing Provider  aspirin 81 MG tablet Take 81 mg by mouth daily.    Historical Provider, MD  Blood Gluc Meter Disp-Strips (BLOOD GLUCOSE METER DISPOSABLE) DEVI 1 Units by Does not apply route as needed. 10/27/12   Clanford Marisa Hua, MD  Blood Glucose Monitoring Suppl (ONE TOUCH ULTRA MINI) W/DEVICE KIT  10/27/12   Historical Provider, MD  Fingerstix Lancets MISC 1 each by Does not apply route 3 (three) times daily before meals. 10/16/12   Debbe Odea, MD  glipiZIDE (GLUCOTROL XL) 5 MG 24 hr tablet Take 1 tablet (5 mg total) by mouth daily with breakfast. 05/23/14   Lance Bosch, NP  Insulin Glargine (LANTUS SOLOSTAR) 100 UNIT/ML Solostar Pen INJECT 20 UNITS INTO THE SKIN AT BEDTIME 12/11/14   Lance Bosch, NP  Insulin Pen Needle 31G X 8 MM MISC Use 1 pen needle once per day or as directed 10/17/12   Clanford Marisa Hua, MD  lisinopril-hydrochlorothiazide (PRINZIDE,ZESTORETIC) 10-12.5 MG per tablet Take 1 tablet by mouth daily. 01/14/15   Lance Bosch, NP  metFORMIN (GLUCOPHAGE) 850 MG tablet take 1 tablet by mouth twice a day 11/22/14   Lance Bosch, NP  metoprolol succinate (TOPROL-XL) 50 MG 24 hr tablet Take  1 tablet (50 mg total) by mouth daily. Take with or immediately following a meal. 05/23/14   Lance Bosch, NP  NOVOFINE 30G X 8 MM MISC use as directed once daily 01/23/15   Lance Bosch, NP  ONE TOUCH ULTRA TEST test strip  10/31/12   Historical Provider, MD  Loma [place] Calcium 500 MG TABS Take 1 tablet (1,250 mg total) by mouth 2 (two) times daily. 10/16/12   Debbe Odea, MD  pravastatin (PRAVACHOL) 20 MG tablet take 1 tablet by mouth once daily 11/22/14   Lance Bosch, NP  zoster vaccine live, PF, (ZOSTAVAX) 36644 UNT/0.65ML injection Inject 19,400 Units into the skin once. 01/17/14   Lance Bosch, NP   BP 162/81 mmHg  Pulse 73  Temp(Src) 98.2 F (36.8  C) (Oral)  Resp 18  SpO2 96% Physical Exam  Constitutional: She is oriented to person, place, and time. She appears well-developed and well-nourished. No distress.  HENT:  Head: Normocephalic.  Visual acuity screening: L: 20/25 R: 20/40.  Eyes: Pupils are equal, round, and reactive to light.  OS: 20/25 OD: 20/40.   Lids everted and no foreign body seen.  Extraocular movement is intact without pain.   Mild right conjunctival injection, patient has a mild swelling to the right upper and lower lids with clear discharge.  OD 2mHg @ 95% confidence interval  No abnormal uptake on fluorescein stain.  No consensual photophobia  Neck: Normal range of motion. Neck supple.  Cardiovascular: Normal rate, regular rhythm and intact distal pulses.   Pulmonary/Chest: Effort normal and breath sounds normal. No respiratory distress. She has no wheezes. She has no rales. She exhibits no tenderness.  Abdominal: Soft. Bowel sounds are normal. She exhibits no distension and no mass. There is no tenderness. There is no rebound and no guarding.  Musculoskeletal: Normal range of motion.  Neurological: She is alert and oriented to person, place, and time. Coordination normal.  Skin: Skin is warm.  Psychiatric: She has a normal mood and affect. Her behavior is normal.  Nursing note and vitals reviewed.   ED Course  Procedures  DIAGNOSTIC STUDIES: Oxygen Saturation is 96% on RA, adequate by my interpretation.    COORDINATION OF CARE: 6:59 PM Discussed treatment plan with pt at bedside and pt agreed to plan. Will perform eye exam.  Visual acuity screening: L: 20/25 R: 20/40.   Labs Review Labs Reviewed - No data to display  Imaging Review No results found. I have personally reviewed and evaluated these images and lab results as part of my medical decision-making.   EKG Interpretation None      MDM   Final diagnoses:  Discomfort of right eye    Filed Vitals:   02/25/15 1756  BP:  162/81  Pulse: 73  Temp: 98.2 F (36.8 C)  TempSrc: Oral  Resp: 18  SpO2: 96%    Medications  tetracaine (PONTOCAINE) 0.5 % ophthalmic solution 2 drop (2 drops Right Eye Given 02/25/15 1931)  fluorescein ophthalmic strip 1 strip (1 strip Right Eye Given 02/25/15 1931)  erythromycin ophthalmic ointment 1 application (1 application Right Eye Given 02/25/15 2039)    GJoslin Doellis 69y.o. female presenting with intermittent right eye pain worsening over the course of a week. Visual acuity is grossly normal. No foreign body seen on lid eversion. No abnormal uptake on fluorescein stain. Patient has normal intraocular pressures. Patient has ophthalmologist, states she can be seen tomorrow. Patient will be started on erythromycin  ointment.  Discussed case with attending physician who agrees with care plan and disposition.   Evaluation does not show pathology that would require ongoing emergent intervention or inpatient treatment. Pt is hemodynamically stable and mentating appropriately. Discussed findings and plan with patient/guardian, who agrees with care plan. All questions answered. Return precautions discussed and outpatient follow up given.   I personally performed the services described in this documentation, which was scribed in my presence. The recorded information has been reviewed and is accurate.    Monico Blitz, PA-C 02/26/15 0021  Dorie Rank, MD 02/26/15 1044

## 2015-02-25 NOTE — ED Notes (Signed)
Pt reports irritation to right eye x 1 progressively worsening. Reports tearing, redness and intermittent itching. Denies drainage. Mild blurred vision. States "it feels like something may be in it." Denies wearing contacts. Denies injury.

## 2015-02-26 NOTE — Telephone Encounter (Signed)
Pt. returned call. Please f/u with pt.

## 2015-02-27 NOTE — Telephone Encounter (Signed)
Pt. Returned call. Please f/u with pt. °

## 2015-05-21 ENCOUNTER — Ambulatory Visit: Payer: Medicare Other | Admitting: Podiatry

## 2015-06-03 ENCOUNTER — Ambulatory Visit (INDEPENDENT_AMBULATORY_CARE_PROVIDER_SITE_OTHER): Payer: Medicare Other | Admitting: Podiatry

## 2015-06-03 ENCOUNTER — Encounter: Payer: Self-pay | Admitting: Podiatry

## 2015-06-03 DIAGNOSIS — B351 Tinea unguium: Secondary | ICD-10-CM | POA: Diagnosis not present

## 2015-06-03 DIAGNOSIS — L84 Corns and callosities: Secondary | ICD-10-CM

## 2015-06-03 DIAGNOSIS — M79673 Pain in unspecified foot: Secondary | ICD-10-CM | POA: Diagnosis not present

## 2015-06-03 DIAGNOSIS — E119 Type 2 diabetes mellitus without complications: Secondary | ICD-10-CM

## 2015-06-03 NOTE — Progress Notes (Signed)
Patient ID: Cindy Taylor, female   DOB: [birth year], 70 y.o.   MRN: [id number]  Subjective: This diabetic patient presents today requesting debridement of thickened toenails and corns and calluses. The last scheduled visit for this patient was 02/12/2015  Objective: No open skin lesions bilaterally Keratoses lateral fifth right toe, medial hallux bilaterally, plantar left first MPJ The toenails 6 are hypertrophic, elongated, deformed and tender to direct palpation 6-10  Assessment: Diabetic without foot complications Mycotic symptomatic toenails 6 Keratoses 4  Plan: Debrided toenails 6 mechanically and electronically without any bleeding Debrided keratoses 4 without any bleeding  Reappoint 3 months

## 2015-06-03 NOTE — Patient Instructions (Signed)
Diabetes and Foot Care Diabetes may cause you to have problems because of poor blood supply (circulation) to your feet and legs. This may cause the skin on your feet to become thinner, break easier, and heal more slowly. Your skin may become dry, and the skin may peel and crack. You may also have nerve damage in your legs and feet causing decreased feeling in them. You may not notice minor injuries to your feet that could lead to infections or more serious problems. Taking care of your feet is one of the most important things you can do for yourself.  HOME CARE INSTRUCTIONS  Wear shoes at all times, even in the house. Do not go barefoot. Bare feet are easily injured.  Check your feet daily for blisters, cuts, and redness. If you cannot see the bottom of your feet, use a mirror or ask someone for help.  Wash your feet with warm water (do not use hot water) and mild soap. Then pat your feet and the areas between your toes until they are completely dry. Do not soak your feet as this can dry your skin.  Apply a moisturizing lotion or petroleum jelly (that does not contain alcohol and is unscented) to the skin on your feet and to dry, brittle toenails. Do not apply lotion between your toes.  Trim your toenails straight across. Do not dig under them or around the cuticle. File the edges of your nails with an emery board or nail file.  Do not cut corns or calluses or try to remove them with medicine.  Wear clean socks or stockings every day. Make sure they are not too tight. Do not wear knee-high stockings since they may decrease blood flow to your legs.  Wear shoes that fit properly and have enough cushioning. To break in new shoes, wear them for just a few hours a day. This prevents you from injuring your feet. Always look in your shoes before you put them on to be sure there are no objects inside.  Do not cross your legs. This may decrease the blood flow to your feet.  If you find a minor scrape,  cut, or break in the skin on your feet, keep it and the skin around it clean and dry. These areas may be cleansed with mild soap and water. Do not cleanse the area with peroxide, alcohol, or iodine.  When you remove an adhesive bandage, be sure not to damage the skin around it.  If you have a wound, look at it several times a day to make sure it is healing.  Do not use heating pads or hot water bottles. They may burn your skin. If you have lost feeling in your feet or legs, you may not know it is happening until it is too late.  Make sure your health care provider performs a complete foot exam at least annually or more often if you have foot problems. Report any cuts, sores, or bruises to your health care provider immediately. SEEK MEDICAL CARE IF:   You have an injury that is not healing.  You have cuts or breaks in the skin.  You have an ingrown nail.  You notice redness on your legs or feet.  You feel burning or tingling in your legs or feet.  You have pain or cramps in your legs and feet.  Your legs or feet are numb.  Your feet always feel cold. SEEK IMMEDIATE MEDICAL CARE IF:   There is increasing redness,   swelling, or pain in or around a wound.  There is a red line that goes up your leg.  Pus is coming from a wound.  You develop a fever or as directed by your health care provider.  You notice a bad smell coming from an ulcer or wound.   This information is not intended to replace advice given to you by your health care provider. Make sure you discuss any questions you have with your health care provider.   Document Released: 04/02/2000 Document Revised: 12/06/2012 Document Reviewed: 09/12/2012 Elsevier Interactive Patient Education 2016 [employer]  

## 2015-07-08 ENCOUNTER — Other Ambulatory Visit: Payer: Self-pay | Admitting: Internal Medicine

## 2015-07-22 ENCOUNTER — Ambulatory Visit: Payer: Medicare Other | Attending: Internal Medicine | Admitting: Internal Medicine

## 2015-07-22 ENCOUNTER — Encounter: Payer: Self-pay | Admitting: Internal Medicine

## 2015-07-22 VITALS — BP 138/82 | HR 84 | Temp 98.4°F | Resp 15 | Ht 63.0 in | Wt 165.6 lb

## 2015-07-22 DIAGNOSIS — Z79899 Other long term (current) drug therapy: Secondary | ICD-10-CM | POA: Diagnosis not present

## 2015-07-22 DIAGNOSIS — Z794 Long term (current) use of insulin: Secondary | ICD-10-CM | POA: Diagnosis not present

## 2015-07-22 DIAGNOSIS — Z7982 Long term (current) use of aspirin: Secondary | ICD-10-CM | POA: Insufficient documentation

## 2015-07-22 DIAGNOSIS — I10 Essential (primary) hypertension: Secondary | ICD-10-CM | POA: Insufficient documentation

## 2015-07-22 DIAGNOSIS — E1165 Type 2 diabetes mellitus with hyperglycemia: Secondary | ICD-10-CM | POA: Diagnosis not present

## 2015-07-22 DIAGNOSIS — E785 Hyperlipidemia, unspecified: Secondary | ICD-10-CM

## 2015-07-22 LAB — GLUCOSE, POCT (MANUAL RESULT ENTRY): POC Glucose: 79 mg/dl (ref 70–99)

## 2015-07-22 LAB — POCT GLYCOSYLATED HEMOGLOBIN (HGB A1C): Hemoglobin A1C: 6.3

## 2015-07-22 NOTE — Progress Notes (Signed)
Patient's here for f/upDM and R knee pain.

## 2015-07-22 NOTE — Progress Notes (Signed)
Patient ID: Cindy Taylor, female   DOB: 1945/04/26, 70 y.o.   MRN: [id number] SUBJECTIVE: 70 y.o. female for follow up of diabetes and hypertension. Diabetic Review of Systems - medication compliance: compliant all of the time, diabetic diet compliance: compliant all of the time, further diabetic ROS: no polyuria or polydipsia, no chest pain, dyspnea or TIA's, no numbness, tingling or pain in extremities, no unusual visual symptoms, no hypoglycemia.  Other symptoms and concerns: December suffered a fall at work after missing a step outside. Right knee ache that is intermittent. She never went for evaluation. States that she feels like she has a bone stick out of right knee that she noticed 2 months ago. Feels like she has pain up her right side that extends up her lumbar spine described as a achy pain. No swelling and she has been able to put weight on the right leg. Pain aggravated by laying down and attempting to turn in bed.  She is up to date on on Colonoscopy, Tdap, and mammogram.  Current Outpatient Prescriptions  Medication Sig Dispense Refill  . aspirin 81 MG tablet Take 81 mg by mouth daily.    . Blood Gluc Meter Disp-Strips (BLOOD GLUCOSE METER DISPOSABLE) DEVI 1 Units by Does not apply route as needed. 1 each 5  . Blood Glucose Monitoring Suppl (ONE TOUCH ULTRA MINI) W/DEVICE KIT     . Fingerstix Lancets MISC 1 each by Does not apply route 3 (three) times daily before meals. 100 each 3  . glipiZIDE (GLUCOTROL XL) 5 MG 24 hr tablet Take 1 tablet (5 mg total) by mouth daily with breakfast. 90 tablet 3  . Insulin Glargine (LANTUS SOLOSTAR) 100 UNIT/ML Solostar Pen INJECT 20 UNITS INTO THE SKIN AT BEDTIME 15 mL 3  . Insulin Pen Needle 31G X 8 MM MISC Use 1 pen needle once per day or as directed 100 each 12  . lisinopril-hydrochlorothiazide (PRINZIDE,ZESTORETIC) 10-12.5 MG per tablet Take 1 tablet by mouth daily. 90 tablet 3  . metFORMIN (GLUCOPHAGE) 850 MG tablet take 1 tablet by mouth twice a  day 180 tablet 3  . metoprolol succinate (TOPROL-XL) 50 MG 24 hr tablet Take 1 tablet (50 mg total) by mouth daily. Take with or immediately following a meal. 90 tablet 3  . ONE TOUCH ULTRA TEST test strip     . Oyster Shell Calcium 500 MG TABS Take 1 tablet (1,250 mg total) by mouth 2 (two) times daily. 60 tablet 3  . pravastatin (PRAVACHOL) 20 MG tablet take 1 tablet by mouth once daily 90 tablet 3  . NOVOFINE 30G X 8 MM MISC use as directed once daily (Patient not taking: Reported on 07/22/2015) 100 each 5  . zoster vaccine live, PF, (ZOSTAVAX) 05654 UNT/0.65ML injection Inject 19,400 Units into the skin once. (Patient not taking: Reported on 07/22/2015) 1 each 0   No current facility-administered medications for this visit.  Review of Systems: Other than what is stated in HPI, all other systems are negative.   OBJECTIVE: Appearance: alert, well appearing, and in no distress, oriented to person, place, and time and normal appearing weight. BP 138/82 mmHg  Pulse 84  Temp(Src) 98.4 F (36.9 C)  Resp 15  Ht 5\' 3"  (1.6 m)  Wt 165 lb 9.6 oz (75.116 kg)  BMI 29.34 kg/m2  SpO2 95%  Exam: heart sounds normal rate, regular rhythm, normal S1, S2, no murmurs, rubs, clicks or gallops, no JVD, chest clear, no carotid bruits  ASSESSMENT: [name] was  seen today for diabetes.  Diagnoses and all orders for this visit:  Type 2 diabetes mellitus with hyperglycemia, with long-term current use of insulin (HCC) -     Glucose (CBG) -     HgB A1c -     Lipid panel; Future -     Basic Metabolic Panel; Future  Essential hypertension, benign Patient blood pressure is stable and may continue on current medication.  Education on diet, exercise, and modifiable risk factors discussed. Will obtain appropriate labs as needed. Will follow up in 3-6 months.   Dyslipidemia Continue Pravastatin. I will have her come back next week for a repeat lipid panel.    PLAN: See orders for this visit as documented in  the electronic medical record. Issues reviewed with her: referral to Diabetic Education department, diabetic diet discussed in detail, written exchange diet given, low cholesterol diet, weight control and daily exercise discussed, foot care discussed and Podiatry visits discussed, annual eye examinations at Ophthalmology discussed and long term diabetic complications discussed.  Return in about 1 week (around 07/29/2015) for lab visit and 3 mo PCP , Diabetes Mellitus.  Lance Bosch, NP 07/22/2015 8:05 PM

## 2015-07-22 NOTE — Progress Notes (Signed)
Follow up on DM2 Checks sugar at home Her A1C is 6.3 and her sugar is 79 today She does not need refills Complains of right hip pain after a fall several months ago

## 2015-07-29 ENCOUNTER — Ambulatory Visit: Payer: Medicare Other | Attending: Internal Medicine

## 2015-07-29 DIAGNOSIS — Z794 Long term (current) use of insulin: Secondary | ICD-10-CM | POA: Diagnosis present

## 2015-07-29 DIAGNOSIS — Z1321 Encounter for screening for nutritional disorder: Secondary | ICD-10-CM

## 2015-07-29 DIAGNOSIS — E1165 Type 2 diabetes mellitus with hyperglycemia: Secondary | ICD-10-CM | POA: Diagnosis present

## 2015-07-29 LAB — BASIC METABOLIC PANEL
BUN: 10 mg/dL (ref 7–25)
CALCIUM: 8.5 mg/dL — AB (ref 8.6–10.4)
CO2: 25 mmol/L (ref 20–31)
Chloride: 103 mmol/L (ref 98–110)
Creat: 0.75 mg/dL (ref 0.50–0.99)
GLUCOSE: 128 mg/dL — AB (ref 65–99)
POTASSIUM: 3.6 mmol/L (ref 3.5–5.3)
SODIUM: 140 mmol/L (ref 135–146)

## 2015-07-29 LAB — LIPID PANEL
CHOL/HDL RATIO: 2.8 ratio (ref <=5.0)
Cholesterol: 128 mg/dL (ref 125–200)
HDL: 45 mg/dL — ABNORMAL LOW (ref >=46)
LDL CALC: 67 mg/dL (ref <130)
Triglycerides: 81 mg/dL (ref <150)
VLDL: 16 mg/dL (ref <30)

## 2015-07-29 NOTE — Patient Instructions (Addendum)
Lab visit only. 

## 2015-07-29 NOTE — Progress Notes (Signed)
Patient was here for lab visit only.

## 2015-07-31 ENCOUNTER — Telehealth: Payer: Self-pay

## 2015-07-31 NOTE — Telephone Encounter (Signed)
-----   Message from Lance Bosch, NP sent at 07/30/2015  7:33 PM EDT ----- All labs normal except slightly low calcium. Get some daily Calcitrol with Vitamin D from pharmacy

## 2015-07-31 NOTE — Telephone Encounter (Signed)
CMA called patient, patient verified name and DOB. Patient was given lab results verbalized he understood with no further questions. 

## 2015-09-01 ENCOUNTER — Telehealth: Payer: Self-pay | Admitting: Internal Medicine

## 2015-09-01 DIAGNOSIS — I10 Essential (primary) hypertension: Secondary | ICD-10-CM

## 2015-09-01 DIAGNOSIS — E089 Diabetes mellitus due to underlying condition without complications: Secondary | ICD-10-CM

## 2015-09-01 NOTE — Telephone Encounter (Signed)
Pt. Called requesting a refill on the following medications:  metoprolol succinate (TOPROL-XL) 50 MG 24 hr tablet  glipiZIDE (GLUCOTROL XL) 5 MG 24 hr tablet  Please f/u with pt.

## 2015-09-02 MED ORDER — METOPROLOL SUCCINATE ER 50 MG PO TB24: 50.0000 mg | ORAL_TABLET | Freq: Every day | ORAL | Status: DC

## 2015-09-02 MED ORDER — GLIPIZIDE ER 5 MG PO TB24: 5.0000 mg | ORAL_TABLET | Freq: Every day | ORAL | Status: DC

## 2015-09-02 NOTE — Telephone Encounter (Signed)
Both medications have been refilled

## 2015-09-09 ENCOUNTER — Ambulatory Visit: Payer: Medicare Other | Admitting: Podiatry

## 2015-11-12 ENCOUNTER — Other Ambulatory Visit: Payer: Self-pay | Admitting: Internal Medicine

## 2015-11-12 DIAGNOSIS — Z1231 Encounter for screening mammogram for malignant neoplasm of breast: Secondary | ICD-10-CM

## 2015-11-23 ENCOUNTER — Other Ambulatory Visit: Payer: Self-pay | Admitting: Internal Medicine

## 2015-11-28 ENCOUNTER — Other Ambulatory Visit: Payer: Self-pay | Admitting: Internal Medicine

## 2015-11-28 DIAGNOSIS — I10 Essential (primary) hypertension: Secondary | ICD-10-CM

## 2015-11-28 DIAGNOSIS — E089 Diabetes mellitus due to underlying condition without complications: Secondary | ICD-10-CM

## 2015-11-28 DIAGNOSIS — E119 Type 2 diabetes mellitus without complications: Secondary | ICD-10-CM

## 2016-01-06 ENCOUNTER — Other Ambulatory Visit: Payer: Self-pay | Admitting: Internal Medicine

## 2016-01-06 DIAGNOSIS — E089 Diabetes mellitus due to underlying condition without complications: Secondary | ICD-10-CM

## 2016-01-13 ENCOUNTER — Ambulatory Visit: Payer: Medicare Other | Attending: Family Medicine | Admitting: Family Medicine

## 2016-01-13 ENCOUNTER — Encounter: Payer: Self-pay | Admitting: Family Medicine

## 2016-01-13 VITALS — BP 122/72 | HR 79 | Temp 99.2°F | Ht 63.0 in | Wt 165.2 lb

## 2016-01-13 DIAGNOSIS — E785 Hyperlipidemia, unspecified: Secondary | ICD-10-CM | POA: Insufficient documentation

## 2016-01-13 DIAGNOSIS — E2839 Other primary ovarian failure: Secondary | ICD-10-CM | POA: Diagnosis not present

## 2016-01-13 DIAGNOSIS — B1920 Unspecified viral hepatitis C without hepatic coma: Secondary | ICD-10-CM | POA: Diagnosis not present

## 2016-01-13 DIAGNOSIS — B351 Tinea unguium: Secondary | ICD-10-CM | POA: Insufficient documentation

## 2016-01-13 DIAGNOSIS — Z720 Tobacco use: Secondary | ICD-10-CM | POA: Insufficient documentation

## 2016-01-13 DIAGNOSIS — Z23 Encounter for immunization: Secondary | ICD-10-CM | POA: Insufficient documentation

## 2016-01-13 DIAGNOSIS — E119 Type 2 diabetes mellitus without complications: Secondary | ICD-10-CM

## 2016-01-13 DIAGNOSIS — B49 Unspecified mycosis: Secondary | ICD-10-CM | POA: Diagnosis not present

## 2016-01-13 DIAGNOSIS — F17290 Nicotine dependence, other tobacco product, uncomplicated: Secondary | ICD-10-CM | POA: Diagnosis not present

## 2016-01-13 DIAGNOSIS — I10 Essential (primary) hypertension: Secondary | ICD-10-CM | POA: Insufficient documentation

## 2016-01-13 DIAGNOSIS — E1169 Type 2 diabetes mellitus with other specified complication: Secondary | ICD-10-CM | POA: Diagnosis not present

## 2016-01-13 DIAGNOSIS — Z794 Long term (current) use of insulin: Secondary | ICD-10-CM | POA: Insufficient documentation

## 2016-01-13 LAB — POCT GLYCOSYLATED HEMOGLOBIN (HGB A1C): Hemoglobin A1C: 6.2

## 2016-01-13 LAB — GLUCOSE, POCT (MANUAL RESULT ENTRY): POC Glucose: 104 mg/dl — AB (ref 70–99)

## 2016-01-13 MED ORDER — METFORMIN HCL 1000 MG PO TABS
1000.0000 mg | ORAL_TABLET | Freq: Two times a day (BID) | ORAL | 3 refills | Status: DC
Start: 2016-01-13 — End: 2016-10-22

## 2016-01-13 MED ORDER — GLUCOSE BLOOD VI STRP: 1.0000 | ORAL_STRIP | Freq: Two times a day (BID) | 1 refills | Status: DC

## 2016-01-13 MED ORDER — CALCIUM CITRATE 200 MG PO TABS: 400.0000 mg | ORAL_TABLET | Freq: Every day | ORAL | 3 refills | Status: DC

## 2016-01-13 MED ORDER — LISINOPRIL-HYDROCHLOROTHIAZIDE 10-12.5 MG PO TABS: 1.0000 | ORAL_TABLET | Freq: Every day | ORAL | 3 refills | Status: DC

## 2016-01-13 MED ORDER — METOPROLOL SUCCINATE ER 50 MG PO TB24: 50.0000 mg | ORAL_TABLET | Freq: Every day | ORAL | 3 refills | Status: DC

## 2016-01-13 MED ORDER — ASPIRIN 81 MG PO TABS: 81.0000 mg | ORAL_TABLET | Freq: Every day | ORAL | 3 refills | Status: DC

## 2016-01-13 MED ORDER — ONETOUCH ULTRA MINI W/DEVICE KIT: 1.0000 | PACK | Freq: Two times a day (BID) | 0 refills | Status: DC

## 2016-01-13 MED ORDER — ZOSTER VACCINE LIVE 19400 UNT/0.65ML ~~LOC~~ SUSR: 0.6500 mL | Freq: Once | SUBCUTANEOUS | 0 refills | Status: AC

## 2016-01-13 MED ORDER — PRAVASTATIN SODIUM 20 MG PO TABS: 20.0000 mg | ORAL_TABLET | Freq: Every day | ORAL | 3 refills | Status: DC

## 2016-01-13 MED ORDER — ONETOUCH LANCETS MISC: 1.0000 | Freq: Two times a day (BID) | 1 refills | Status: DC

## 2016-01-13 MED ORDER — GLIPIZIDE ER 5 MG PO TB24
ORAL_TABLET | ORAL | 3 refills | Status: DC
Start: 2016-01-13 — End: 2016-10-22

## 2016-01-13 NOTE — Progress Notes (Signed)
Pt requests flu shot Also requests rx for another kind of calcium supplement - reports oyster shell is too expensive

## 2016-01-13 NOTE — Assessment & Plan Note (Signed)
Tolerating statin. Continue. 

## 2016-01-13 NOTE — Patient Instructions (Addendum)
Steph was seen today for diabetes.  Diagnoses and all orders for this visit:  Type 2 diabetes mellitus without complication, with long-term current use of insulin (HCC) -     HgB A1c -     Glucose (CBG) -     metFORMIN (GLUCOPHAGE) 1000 MG tablet; Take 1 tablet (1,000 mg total) by mouth 2 (two) times daily with a meal. -     glipiZIDE (GLUCOTROL XL) 5 MG 24 hr tablet; take 1 tablet by mouth once daily WITH BREAKFAST -     aspirin 81 MG tablet; Take 1 tablet (81 mg total) by mouth daily. -     ONE TOUCH LANCETS MISC; 1 each by Does not apply route 2 (two) times daily. ICD 10 E11.9 -     glucose blood (ONE TOUCH ULTRA TEST) test strip; 1 each by Other route 2 (two) times daily. ICD 10 E11.9 -     Blood Glucose Monitoring Suppl (ONE TOUCH ULTRA MINI) w/Device KIT; 1 each by Other route 2 (two) times daily. ICD 10 E11.9  Essential hypertension, benign -     metoprolol succinate (TOPROL-XL) 50 MG 24 hr tablet; Take 1 tablet (50 mg total) by mouth daily. Take with or immediately following a meal. -     lisinopril-hydrochlorothiazide (PRINZIDE,ZESTORETIC) 10-12.5 MG tablet; Take 1 tablet by mouth daily.  Chews tobacco  Dyslipidemia -     pravastatin (PRAVACHOL) 20 MG tablet; Take 1 tablet (20 mg total) by mouth daily.  Estrogen deficiency -     Calcium Citrate 200 MG TABS; Take 2 tablets (400 mg total) by mouth daily.  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   Stop lantus  Diabetes blood sugar goals  Fasting (in AM before breakfast, 8 hrs of no eating or drinking (except water or unsweetened coffee or tea): 90-110 2 hrs after meals: < 160,   No low  sugars: nothing < 70   Call if sugars start to run high   Increase metformin to 1000 mg twice daily   F/u in 3 months for diabetes  Dr. Adrian Blackwater

## 2016-01-13 NOTE — Assessment & Plan Note (Signed)
Controlled continue current regime

## 2016-01-13 NOTE — Assessment & Plan Note (Addendum)
Controlled Med: compliant  P: Stop lantus Increase metformin to 1000 mg BID Continue glipizide 5 mg XL daily

## 2016-01-13 NOTE — Assessment & Plan Note (Signed)
Has toenail fungus Has hx of hep C  Continue prn podiatry visit for nail clipping

## 2016-01-13 NOTE — Progress Notes (Signed)
Subjective:  Patient ID: Cindy Taylor, female    DOB: [birth year]  Age: 70 y.o. MRN: [id number]  CC: Diabetes   HPI Cindy Taylor has HTN, HLD, DM2 presents for   1. CHRONIC DIABETES  Disease Monitoring  Blood Sugar Ranges: not checking   Polyuria: no   Visual problems: no   Medication Compliance: yes, would like to stop lantus  Medication Side Effects  Hypoglycemia: yes, sometimes feels sugar is low if she does not eat enough    Preventitive Health Care  Eye Exam: done May 2017 normal   Foot Exam: done today   Diet pattern: regular meals   Exercise: no  2. HTN: no HA, CP, SOB or edema. She is compliant with metoprolol and prinzide. No cough.   3. Hx of hep C: treated with harvoni. No abdominal pain, nausea or emesis.   4. Toenail fungus: chronic. No oral treatment due to hx of hep C. Was going to podiatry every 3 months for toenail clipping.   Social History  Substance Use Topics  . Smoking status: Never Smoker  . Smokeless tobacco: Current User    Types: Snuff  . Alcohol use No     Comment: glass of wine at special occasion    Outpatient Medications Prior to Visit  Medication Sig Dispense Refill  . aspirin 81 MG tablet Take 81 mg by mouth daily.    . Blood Gluc Meter Disp-Strips (BLOOD GLUCOSE METER DISPOSABLE) DEVI 1 Units by Does not apply route as needed. 1 each 5  . Blood Glucose Monitoring Suppl (ONE TOUCH ULTRA MINI) W/DEVICE KIT     . Fingerstix Lancets MISC 1 each by Does not apply route 3 (three) times daily before meals. 100 each 3  . glipiZIDE (GLUCOTROL XL) 5 MG 24 hr tablet take 1 tablet by mouth once daily WITH BREAKFAST 30 tablet 0  . Insulin Pen Needle 31G X 8 MM MISC Use 1 pen needle once per day or as directed 100 each 12  . LANTUS SOLOSTAR 100 UNIT/ML Solostar Pen inject subcutaneously 20 unit once daily at bedtime 15 mL 0  . lisinopril-hydrochlorothiazide (PRINZIDE,ZESTORETIC) 10-12.5 MG tablet take 1 tablet by mouth once daily 30  tablet 0  . metFORMIN (GLUCOPHAGE) 850 MG tablet take 1 tablet by mouth twice a day 60 tablet 0  . metoprolol succinate (TOPROL-XL) 50 MG 24 hr tablet take 1 tablet by mouth once daily with food 30 tablet 0  . ONE TOUCH ULTRA TEST test strip     . pravastatin (PRAVACHOL) 20 MG tablet take 1 tablet by mouth once daily 30 tablet 0  . NOVOFINE 30G X 8 MM MISC use as directed once daily (Patient not taking: Reported on 01/13/2016) 100 each 5  . Oyster Shell Calcium 500 MG TABS Take 1 tablet (1,250 mg total) by mouth 2 (two) times daily. (Patient not taking: Reported on 01/13/2016) 60 tablet 3  . zoster vaccine live, PF, (ZOSTAVAX) 62130 UNT/0.65ML injection Inject 19,400 Units into the skin once. (Patient not taking: Reported on 01/13/2016) 1 each 0   No facility-administered medications prior to visit.     ROS Review of Systems  Constitutional: Negative for chills and fever.  Eyes: Negative for visual disturbance.  Respiratory: Negative for shortness of breath.   Cardiovascular: Negative for chest pain.  Gastrointestinal: Negative for abdominal pain and blood in stool.  Musculoskeletal: Negative for arthralgias and back pain.  Skin: Negative for rash.  Allergic/Immunologic: Negative for immunocompromised state.  Hematological:  Negative for adenopathy. Does not bruise/bleed easily.  Psychiatric/Behavioral: Negative for dysphoric mood and suicidal ideas.    Objective:  BP 122/72 (BP Location: Right Arm, Patient Position: Sitting, Cuff Size: Normal)   Pulse 79   Temp 99.2 F (37.3 C) (Oral)   Ht _0  (1.6 m)   Wt 165 lb 3.2 oz (74.9 kg)   SpO2 96%   BMI 29.26 kg/m   BP/Weight 01/13/2016 07/22/2015 61/08/3792  Systolic BP 327 614 709  Diastolic BP 72 82 81  Wt. (Lbs) 165.2 165.6 -  BMI 29.26 29.34 -   Physical Exam  Constitutional: She is oriented to person, place, and time. She appears well-developed and well-nourished. No distress.  HENT:  Head: Normocephalic and atraumatic.    Cardiovascular: Normal rate, regular rhythm, normal heart sounds and intact distal pulses.   Pulmonary/Chest: Effort normal and breath sounds normal.  Musculoskeletal: She exhibits no edema.  Neurological: She is alert and oriented to person, place, and time.  Skin: Skin is warm and dry. No rash noted.  Psychiatric: She has a normal mood and affect.   Lab Results  Component Value Date   HGBA1C 6.2 01/13/2016   CBG 104   Assessment & Plan:   Cindy Taylor was seen today for diabetes.  Diagnoses and all orders for this visit:  Type 2 diabetes mellitus without complication, with long-term current use of insulin (HCC) -     HgB A1c -     Glucose (CBG) -     metFORMIN (GLUCOPHAGE) 1000 MG tablet; Take 1 tablet (1,000 mg total) by mouth 2 (two) times daily with a meal. -     glipiZIDE (GLUCOTROL XL) 5 MG 24 hr tablet; take 1 tablet by mouth once daily WITH BREAKFAST -     aspirin 81 MG tablet; Take 1 tablet (81 mg total) by mouth daily. -     ONE TOUCH LANCETS MISC; 1 each by Does not apply route 2 (two) times daily. ICD 10 E11.9 -     glucose blood (ONE TOUCH ULTRA TEST) test strip; 1 each by Other route 2 (two) times daily. ICD 10 E11.9 -     Blood Glucose Monitoring Suppl (ONE TOUCH ULTRA MINI) w/Device KIT; 1 each by Other route 2 (two) times daily. ICD 10 E11.9  Essential hypertension, benign -     metoprolol succinate (TOPROL-XL) 50 MG 24 hr tablet; Take 1 tablet (50 mg total) by mouth daily. Take with or immediately following a meal. -     lisinopril-hydrochlorothiazide (PRINZIDE,ZESTORETIC) 10-12.5 MG tablet; Take 1 tablet by mouth daily.  Chews tobacco  Dyslipidemia -     pravastatin (PRAVACHOL) 20 MG tablet; Take 1 tablet (20 mg total) by mouth daily.  Estrogen deficiency -     Calcium Citrate 200 MG TABS; Take 2 tablets (400 mg total) by mouth daily.  Need for prophylactic vaccination with combined diphtheria-tetanus-pertussis (DTP) vaccine -     Tdap vaccine greater than  or equal to 7yo IM  Other orders -     Flu Vaccine QUAD 36+ mos IM -     Zoster Vaccine Live, PF, (ZOSTAVAX) 29574 UNT/0.65ML injection; Inject 19,400 Units into the skin once.    No orders of the defined types were placed in this encounter.   Follow-up: No Follow-up on file.   Boykin Nearing MD

## 2016-02-12 ENCOUNTER — Ambulatory Visit
Admission: RE | Admit: 2016-02-12 | Discharge: 2016-02-12 | Disposition: A | Discharge status: Home / Self Care | Payer: Medicare Other | Source: Ambulatory Visit | Attending: Obstetrics & Gynecology | Admitting: Obstetrics & Gynecology

## 2016-02-12 DIAGNOSIS — Z1231 Encounter for screening mammogram for malignant neoplasm of breast: Secondary | ICD-10-CM

## 2016-02-17 ENCOUNTER — Other Ambulatory Visit: Payer: Self-pay | Admitting: Internal Medicine

## 2016-02-17 DIAGNOSIS — E089 Diabetes mellitus due to underlying condition without complications: Secondary | ICD-10-CM

## 2016-09-01 ENCOUNTER — Encounter: Payer: Self-pay | Admitting: Family Medicine

## 2016-10-22 ENCOUNTER — Encounter: Payer: Self-pay | Admitting: Family Medicine

## 2016-10-22 ENCOUNTER — Ambulatory Visit: Payer: Medicare Other | Attending: Family Medicine | Admitting: Family Medicine

## 2016-10-22 VITALS — BP 138/74 | HR 76 | Temp 98.2°F | Ht 63.0 in | Wt 147.4 lb

## 2016-10-22 DIAGNOSIS — Z7982 Long term (current) use of aspirin: Secondary | ICD-10-CM | POA: Insufficient documentation

## 2016-10-22 DIAGNOSIS — R634 Abnormal weight loss: Secondary | ICD-10-CM | POA: Insufficient documentation

## 2016-10-22 DIAGNOSIS — Z794 Long term (current) use of insulin: Secondary | ICD-10-CM | POA: Diagnosis not present

## 2016-10-22 DIAGNOSIS — B351 Tinea unguium: Secondary | ICD-10-CM | POA: Diagnosis not present

## 2016-10-22 DIAGNOSIS — B1920 Unspecified viral hepatitis C without hepatic coma: Secondary | ICD-10-CM | POA: Diagnosis not present

## 2016-10-22 DIAGNOSIS — E1169 Type 2 diabetes mellitus with other specified complication: Secondary | ICD-10-CM

## 2016-10-22 DIAGNOSIS — I10 Essential (primary) hypertension: Secondary | ICD-10-CM

## 2016-10-22 DIAGNOSIS — E119 Type 2 diabetes mellitus without complications: Secondary | ICD-10-CM

## 2016-10-22 DIAGNOSIS — E785 Hyperlipidemia, unspecified: Secondary | ICD-10-CM | POA: Insufficient documentation

## 2016-10-22 LAB — POCT GLYCOSYLATED HEMOGLOBIN (HGB A1C): Hemoglobin A1C: 6.2

## 2016-10-22 LAB — GLUCOSE, POCT (MANUAL RESULT ENTRY): POC Glucose: 88 mg/dl (ref 70–99)

## 2016-10-22 MED ORDER — METFORMIN HCL 500 MG PO TABS: 500.0000 mg | ORAL_TABLET | Freq: Two times a day (BID) | ORAL | 3 refills | Status: DC

## 2016-10-22 MED ORDER — GLUCOSE BLOOD VI STRP: 1.0000 | ORAL_STRIP | Freq: Two times a day (BID) | 1 refills | Status: DC

## 2016-10-22 MED ORDER — ONETOUCH LANCETS MISC: 1.0000 | Freq: Two times a day (BID) | 1 refills | Status: DC

## 2016-10-22 MED ORDER — GLIPIZIDE ER 5 MG PO TB24: ORAL_TABLET | ORAL | 3 refills | Status: DC

## 2016-10-22 MED ORDER — LISINOPRIL-HYDROCHLOROTHIAZIDE 10-12.5 MG PO TABS: 1.0000 | ORAL_TABLET | Freq: Every day | ORAL | 3 refills | Status: DC

## 2016-10-22 MED ORDER — ASPIRIN 81 MG PO TABS: 81.0000 mg | ORAL_TABLET | Freq: Every day | ORAL | 3 refills | Status: AC

## 2016-10-22 MED ORDER — PRAVASTATIN SODIUM 20 MG PO TABS: 20.0000 mg | ORAL_TABLET | Freq: Every day | ORAL | 3 refills | Status: DC

## 2016-10-22 MED ORDER — METOPROLOL SUCCINATE ER 50 MG PO TB24: 50.0000 mg | ORAL_TABLET | Freq: Every day | ORAL | 3 refills | Status: DC

## 2016-10-22 MED ORDER — CICLOPIROX 8 % EX SOLN: Freq: Every day | CUTANEOUS | 0 refills | Status: DC

## 2016-10-22 NOTE — Progress Notes (Addendum)
Subjective:  Patient ID: Cindy Taylor, female    DOB: [birth year]  Age: 71 y.o. MRN: [id number]  CC: Diabetes   HPI Cindy Taylor has HTN, HLD, DM2 presents for   1. CHRONIC DIABETES  Disease Monitoring  Blood Sugar Ranges: not checking   Polyuria: no   Visual problems: no   Medication Compliance: yes, would like to stop lantus  Medication Side Effects  Hypoglycemia: yes, sometimes feels sugar is low if she skips meals  Preventitive Health Care  Eye Exam: scheduled for 11/2016   Foot Exam: done today   Diet pattern: regular meals   Exercise: no  2. HTN: no HA, CP, SOB or edema. She is compliant with metoprolol and prinzide. No cough.   3. Hx of hep C: treated with harvoni. No abdominal pain, nausea or emesis.   4. Toenail fungus: chronic. No oral treatment due to hx of hep C. Was going to podiatry every 3 months for toenail clipping but stopped since her insurance was not covering every visit.   5. 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 She reports skipping meals at times. She denies fever, chills, nausea, emesis and abdominal pain. She is due for her screening colonoscopy but has not scheduled it due to an outstanding balance at [facility].   Social History  Substance Use Topics  . Smoking status: Never Smoker  . Smokeless tobacco: Current User    Types: Snuff  . Alcohol use No     Comment: glass of wine at special occasion    Outpatient Medications Prior to Visit  Medication Sig Dispense Refill  . aspirin 81 MG tablet Take 1 tablet (81 mg total) by mouth daily. 90 tablet 3  . Blood Glucose Monitoring Suppl (ONE TOUCH ULTRA MINI) w/Device KIT 1 each by Other route 2 (two) times daily. ICD 10 E11.9 1 each 0  . Calcium Citrate 200 MG TABS Take 2 tablets (400 mg total) by mouth daily. 180 tablet 3  . glipiZIDE (GLUCOTROL XL) 5 MG 24 hr tablet take 1 tablet by mouth once daily WITH BREAKFAST 90  tablet 3  . glucose blood (ONE TOUCH ULTRA TEST) test strip 1 each by Other route 2 (two) times daily. ICD 10 E11.9 100 each 1  . Insulin Pen Needle 31G X 8 MM MISC Use 1 pen needle once per day or as directed 100 each 12  . lisinopril-hydrochlorothiazide (PRINZIDE,ZESTORETIC) 10-12.5 MG tablet Take 1 tablet by mouth daily. 90 tablet 3  . metFORMIN (GLUCOPHAGE) 1000 MG tablet Take 1 tablet (1,000 mg total) by mouth 2 (two) times daily with a meal. 180 tablet 3  . metoprolol succinate (TOPROL-XL) 50 MG 24 hr tablet Take 1 tablet (50 mg total) by mouth daily. Take with or immediately following a meal. 90 tablet 3  . ONE TOUCH LANCETS MISC 1 each by Does not apply route 2 (two) times daily. ICD 10 E11.9 200 each 1  . pravastatin (PRAVACHOL) 20 MG tablet Take 1 tablet (20 mg total) by mouth daily. 90 tablet 3   No facility-administered medications prior to visit.     ROS Review of Systems  Constitutional: Negative for chills and fever.  Eyes: Negative for visual disturbance.  Respiratory: Negative for shortness of breath.   Cardiovascular: Negative for chest pain.  Gastrointestinal: Negative for abdominal pain and blood in stool.  Musculoskeletal: Negative for arthralgias and back pain.  Skin: Negative for rash.  Allergic/Immunologic:  Negative for immunocompromised state.  Hematological: Negative for adenopathy. Does not bruise/bleed easily.  Psychiatric/Behavioral: Negative for dysphoric mood and suicidal ideas.    Objective:  BP 138/74   Pulse 76   Temp 98.2 F (36.8 C) (Oral)   Ht 5' 3" (1.6 m)   Wt 147 lb 6.4 oz (66.9 kg)   SpO2 98%   BMI 26.11 kg/m   BP/Weight 10/22/2016 01/13/2016 07/22/2015  Systolic BP 138 122 138  Diastolic BP 74 72 82  Wt. (Lbs) 147.4 165.2 165.6  BMI 26.11 29.26 29.34   Physical Exam  Constitutional: She is oriented to person, place, and time. She appears well-developed and well-nourished. No distress.  HENT:  Head: Normocephalic and atraumatic.    Cardiovascular: Normal rate, regular rhythm, normal heart sounds and intact distal pulses.   Pulmonary/Chest: Effort normal and breath sounds normal.  Musculoskeletal: She exhibits no edema.  Neurological: She is alert and oriented to person, place, and time.  Skin: Skin is warm and dry. No rash noted.  Psychiatric: She has a normal mood and affect.   Lab Results  Component Value Date   HGBA1C 6.2 10/22/2016    CBG 104   Assessment & Plan:   Cindy Taylor was seen today for diabetes.  Diagnoses and all orders for this visit:  Type 2 diabetes mellitus without complication, with long-term current use of insulin (HCC) -     POCT glucose (manual entry) -     POCT glycosylated hemoglobin (Hb A1C) -     Lipid Panel -     metFORMIN (GLUCOPHAGE) 500 MG tablet; Take 1 tablet (500 mg total) by mouth 2 (two) times daily with a meal. -     ONE TOUCH LANCETS MISC; 1 each by Does not apply route 2 (two) times daily. ICD 10 E11.9 -     glipiZIDE (GLUCOTROL XL) 5 MG 24 hr tablet; take 1 tablet by mouth once daily WITH BREAKFAST -     glucose blood (ONE TOUCH ULTRA TEST) test strip; 1 each by Other route 2 (two) times daily. ICD 10 E11.9 -     aspirin 81 MG tablet; Take 1 tablet (81 mg total) by mouth daily.  Essential hypertension, benign -     CMP14+EGFR; Future -     metoprolol succinate (TOPROL-XL) 50 MG 24 hr tablet; Take 1 tablet (50 mg total) by mouth daily. Take with or immediately following a meal. -     lisinopril-hydrochlorothiazide (PRINZIDE,ZESTORETIC) 10-12.5 MG tablet; Take 1 tablet by mouth daily.  Onychomycosis of multiple toenails with type 2 diabetes mellitus (HCC) -     ciclopirox (PENLAC) 8 % solution; Apply topically at bedtime. Apply over nail and surrounding skin. Apply daily over previous coat. After seven (7) days, may remove with alcohol and continue cycle.  Unintentional weight loss -     CBC; Future -     TSH; Future  Dyslipidemia -     pravastatin (PRAVACHOL) 20  MG tablet; Take 1 tablet (20 mg total) by mouth daily.    No orders of the defined types were placed in this encounter.   Follow-up: Return in about 3 months (around 01/22/2017) for weight check .   Josalyn Funches MD   

## 2016-10-22 NOTE — Assessment & Plan Note (Signed)
Well controlled Tapering down on metformin due to weight loss

## 2016-10-22 NOTE — Assessment & Plan Note (Signed)
unintentionally weight loss with well controlled diabetes Plan: Labs per orders Decrease metformin to 500 mg BID Advised patient to complete follow up screening colonoscopy

## 2016-10-22 NOTE — Assessment & Plan Note (Signed)
penlac ordered

## 2016-10-22 NOTE — Patient Instructions (Addendum)
  Cindy Taylor was seen today for diabetes.  Diagnoses and all orders for this visit:  Type 2 diabetes mellitus without complication, with long-term current use of insulin (HCC) -     POCT glucose (manual entry) -     POCT glycosylated hemoglobin (Hb A1C) -     Lipid Panel -     metFORMIN (GLUCOPHAGE) 500 MG tablet; Take 1 tablet (500 mg total) by mouth 2 (two) times daily with a meal. -     ONE TOUCH LANCETS MISC; 1 each by Does not apply route 2 (two) times daily. ICD 10 E11.9 -     glipiZIDE (GLUCOTROL XL) 5 MG 24 hr tablet; take 1 tablet by mouth once daily WITH BREAKFAST -     glucose blood (ONE TOUCH ULTRA TEST) test strip; 1 each by Other route 2 (two) times daily. ICD 10 E11.9 -     aspirin 81 MG tablet; Take 1 tablet (81 mg total) by mouth daily.  Essential hypertension, benign -     CMP14+EGFR; Future -     metoprolol succinate (TOPROL-XL) 50 MG 24 hr tablet; Take 1 tablet (50 mg total) by mouth daily. Take with or immediately following a meal. -     lisinopril-hydrochlorothiazide (PRINZIDE,ZESTORETIC) 10-12.5 MG tablet; Take 1 tablet by mouth daily.  Onychomycosis of multiple toenails with type 2 diabetes mellitus (HCC) -     ciclopirox (PENLAC) 8 % solution; Apply topically at bedtime. Apply over nail and surrounding skin. Apply daily over previous coat. After seven (7) days, may remove with alcohol and continue cycle.  Unintentional weight loss -     CBC; Future -     TSH; Future  Dyslipidemia -     pravastatin (PRAVACHOL) 20 MG tablet; Take 1 tablet (20 mg total) by mouth daily.   Decrease metformin to 500 mg BID due to weight loss Please call [facility] to inquire about paying your bill and getting another colonoscopy   Vit D 3 1000 IU daily Calcium citrate 800 mg daily   F/u in 3 months for weight check   Dr. Adrian Blackwater

## 2016-10-22 NOTE — Assessment & Plan Note (Signed)
Well-controlled.  Continue current regimen. 

## 2016-10-28 ENCOUNTER — Ambulatory Visit: Payer: Medicare Other | Attending: Family Medicine

## 2016-10-28 DIAGNOSIS — I10 Essential (primary) hypertension: Secondary | ICD-10-CM | POA: Insufficient documentation

## 2016-10-28 DIAGNOSIS — R634 Abnormal weight loss: Secondary | ICD-10-CM | POA: Insufficient documentation

## 2016-10-28 NOTE — Progress Notes (Signed)
Patient here for lab visit only 

## 2016-10-29 LAB — CMP14+EGFR
A/G RATIO: 1.7 (ref 1.2–2.2)
ALBUMIN: 4.4 g/dL (ref 3.6–4.8)
ALK PHOS: 47 IU/L (ref 39–117)
ALT: 14 IU/L (ref 0–32)
AST: 18 IU/L (ref 0–40)
BUN / CREAT RATIO: 18 (ref 12–28)
BUN: 14 mg/dL (ref 8–27)
Bilirubin Total: 0.3 mg/dL (ref 0.0–1.2)
CO2: 23 mmol/L (ref 20–29)
CREATININE: 0.78 mg/dL (ref 0.57–1.00)
Calcium: 9.4 mg/dL (ref 8.7–10.3)
Chloride: 101 mmol/L (ref 96–106)
GFR calc Af Amer: 90 mL/min/{1.73_m2} (ref >59)
GFR calc non Af Amer: 78 mL/min/{1.73_m2} (ref >59)
Globulin, Total: 2.6 g/dL (ref 1.5–4.5)
Glucose: 135 mg/dL — ABNORMAL HIGH (ref 65–99)
POTASSIUM: 4.4 mmol/L (ref 3.5–5.2)
Sodium: 142 mmol/L (ref 134–144)
Total Protein: 7 g/dL (ref 6.0–8.5)

## 2016-10-29 LAB — TSH: TSH: 3.17 u[IU]/mL (ref 0.450–4.500)

## 2016-10-29 LAB — CBC
Hematocrit: 39 % (ref 34.0–46.6)
Hemoglobin: 13 g/dL (ref 11.1–15.9)
MCH: 26.3 pg — AB (ref 26.6–33.0)
MCHC: 33.3 g/dL (ref 31.5–35.7)
MCV: 79 fL (ref 79–97)
Platelets: 190 10*3/uL (ref 150–379)
RBC: 4.94 x10E6/uL (ref 3.77–5.28)
RDW: 15.1 % (ref 12.3–15.4)
WBC: 5.4 10*3/uL (ref 3.4–10.8)

## 2016-11-02 ENCOUNTER — Telehealth: Payer: Self-pay

## 2016-11-02 NOTE — Telephone Encounter (Signed)
Pt was called and informed of lab results. 

## 2016-11-10 IMAGING — MG MM SCREENING BREAST TOMO BILATERAL
6 of 10 series · 6 of 26 positions shown · non-contrast
Comparison: Previous exam(s).

CLINICAL DATA: Screening.

EXAM:
DIGITAL SCREENING BILATERAL MAMMOGRAM WITH 3D TOMO WITH CAD

[L MLO (1 of 2)]
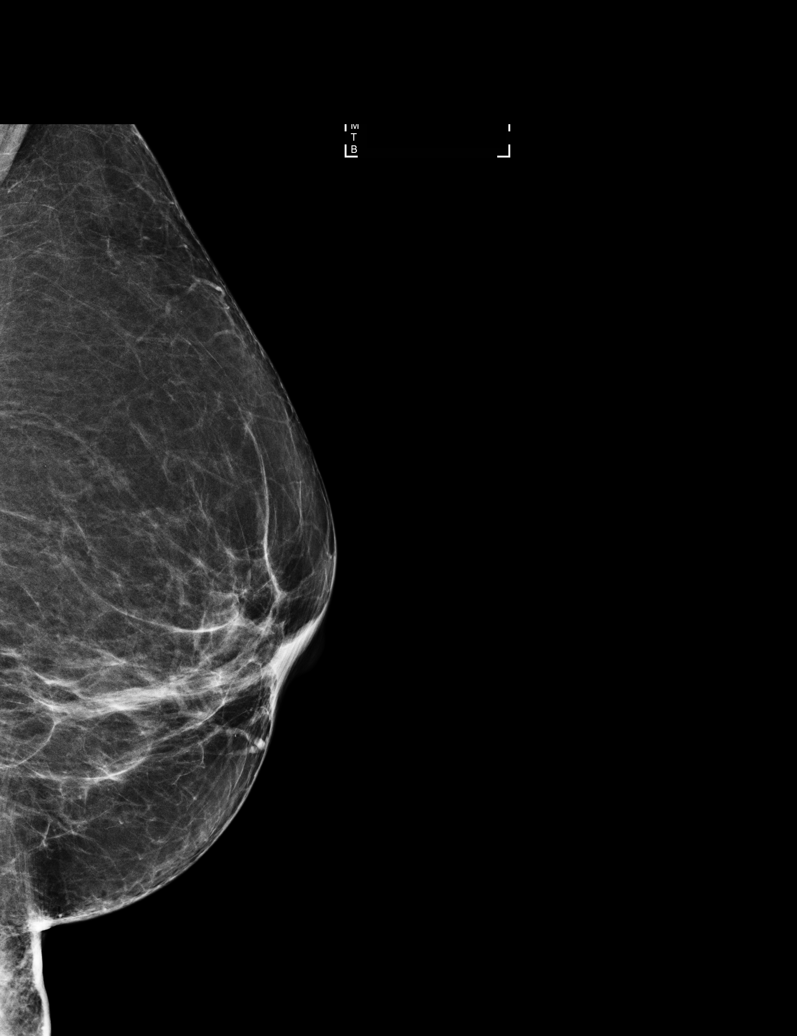

[R MLO (1 of 2)]
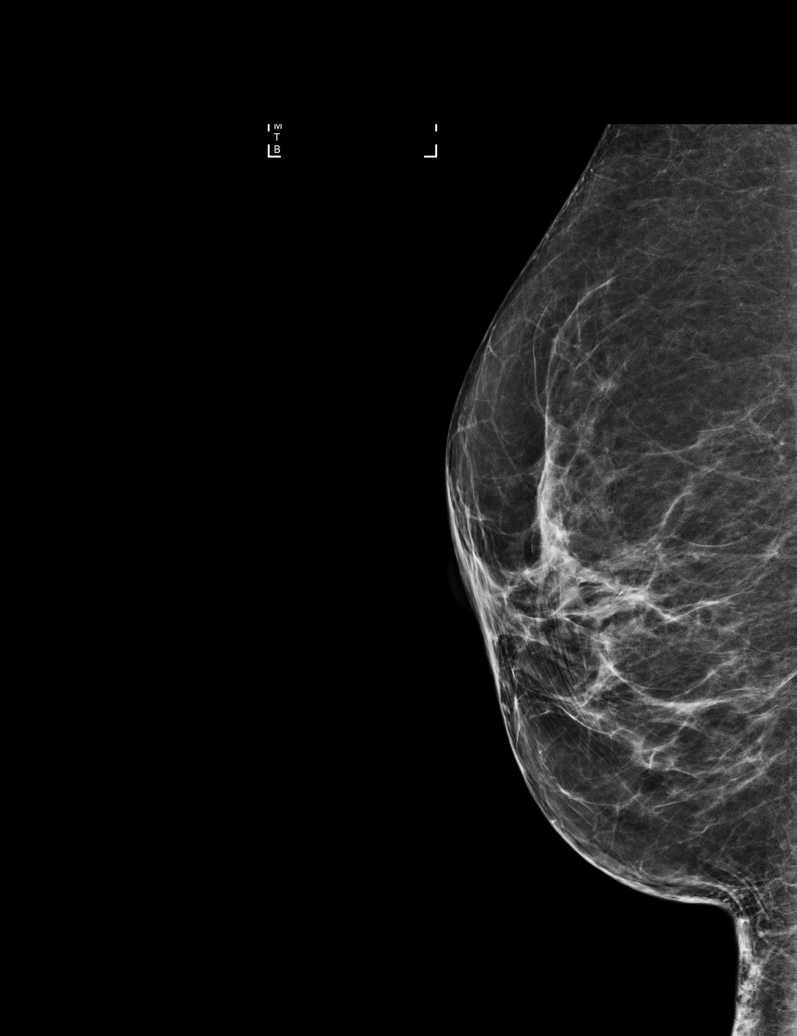

[R MLO (2 of 2)]
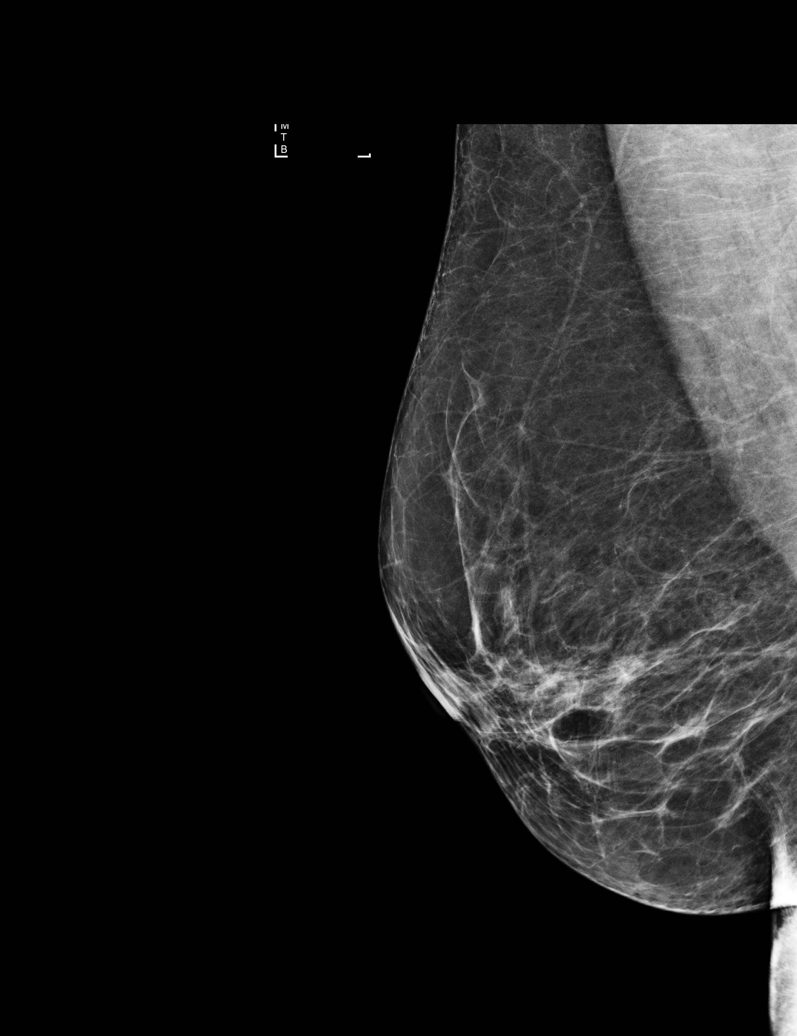

[L CC]
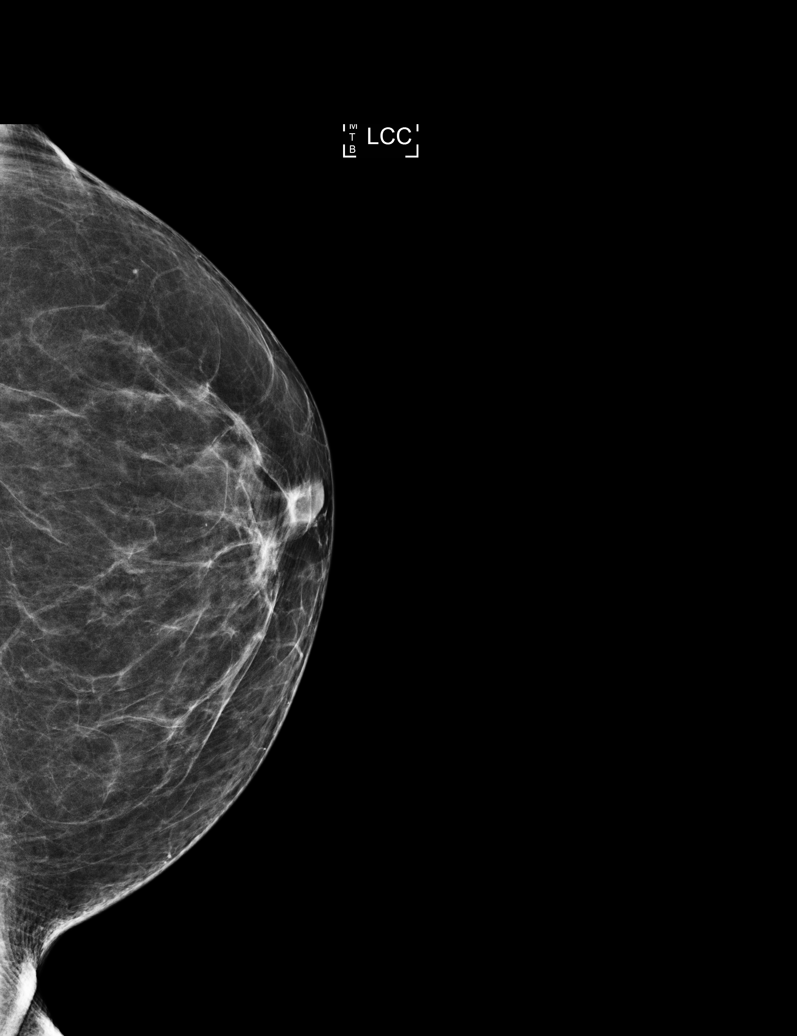

[L MLO (2 of 2)]
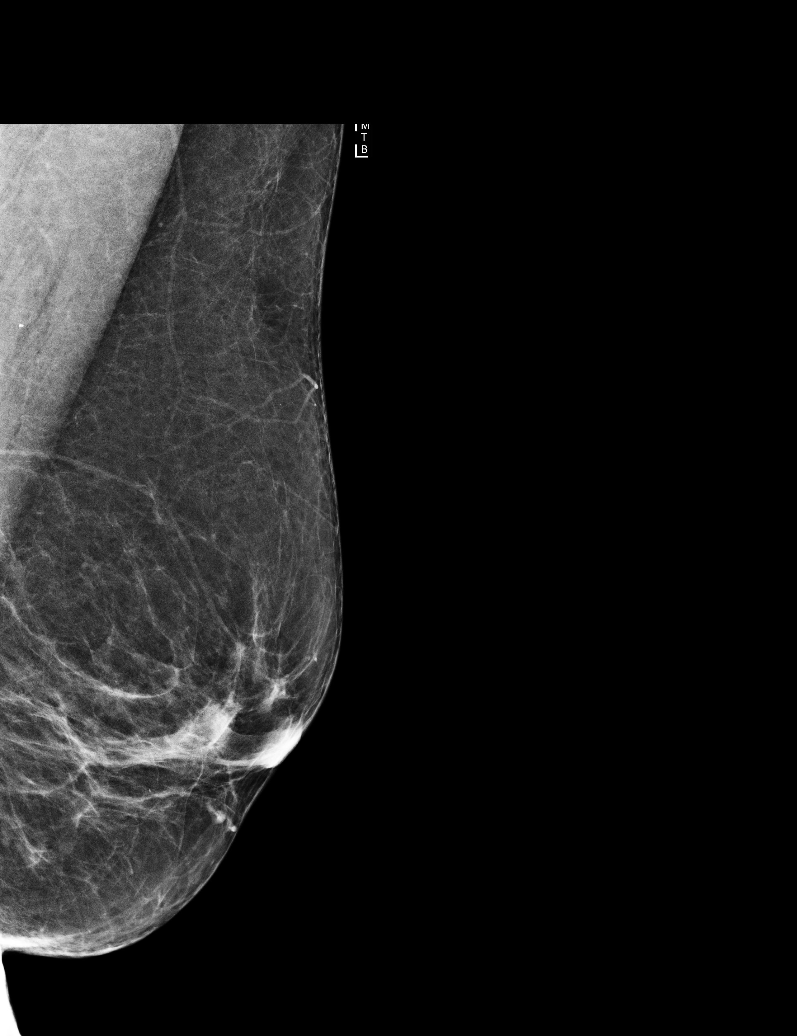

[R CC]
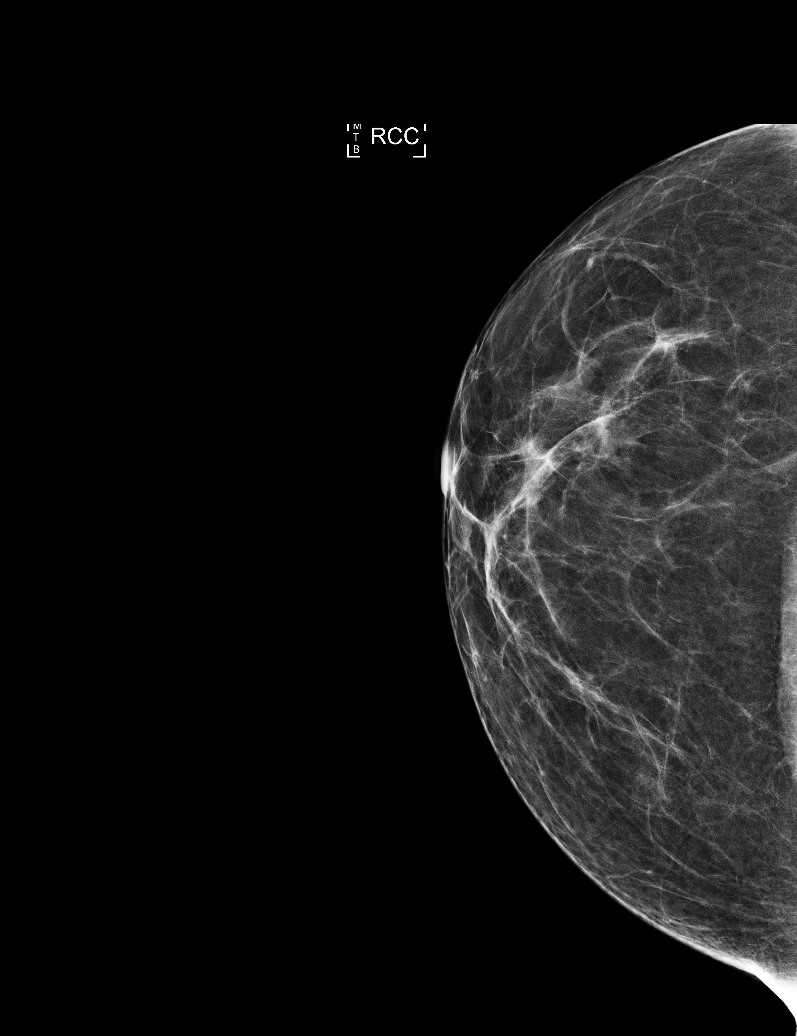

[6 of 26 positions shown; findings below may reference images not displayed]

ACR Breast Density Category b: There are scattered areas of
fibroglandular density.
FINDINGS: There are no findings suspicious for malignancy. Images were
processed with CAD.
IMPRESSION: No mammographic evidence of malignancy. A result letter of this
screening mammogram will be mailed directly to the patient.

RECOMMENDATION:
Screening mammogram in one year. (Code:[id number]-L-[id number])

BI-RADS CATEGORY  1: Negative.

## 2017-01-17 ENCOUNTER — Other Ambulatory Visit: Payer: Self-pay | Admitting: Obstetrics & Gynecology

## 2017-01-17 DIAGNOSIS — Z1239 Encounter for other screening for malignant neoplasm of breast: Secondary | ICD-10-CM

## 2017-01-24 ENCOUNTER — Ambulatory Visit: Payer: Medicare Other | Attending: Internal Medicine | Admitting: Internal Medicine

## 2017-01-24 ENCOUNTER — Encounter: Payer: Self-pay | Admitting: Internal Medicine

## 2017-01-24 VITALS — BP 132/80 | HR 92 | Temp 98.3°F | Resp 16 | Wt 147.0 lb

## 2017-01-24 DIAGNOSIS — E119 Type 2 diabetes mellitus without complications: Secondary | ICD-10-CM | POA: Diagnosis present

## 2017-01-24 DIAGNOSIS — Z8619 Personal history of other infectious and parasitic diseases: Secondary | ICD-10-CM | POA: Diagnosis not present

## 2017-01-24 DIAGNOSIS — E785 Hyperlipidemia, unspecified: Secondary | ICD-10-CM | POA: Diagnosis not present

## 2017-01-24 DIAGNOSIS — I10 Essential (primary) hypertension: Secondary | ICD-10-CM | POA: Diagnosis not present

## 2017-01-24 DIAGNOSIS — F17220 Nicotine dependence, chewing tobacco, uncomplicated: Secondary | ICD-10-CM | POA: Insufficient documentation

## 2017-01-24 DIAGNOSIS — Z8673 Personal history of transient ischemic attack (TIA), and cerebral infarction without residual deficits: Secondary | ICD-10-CM | POA: Diagnosis not present

## 2017-01-24 DIAGNOSIS — Z1211 Encounter for screening for malignant neoplasm of colon: Secondary | ICD-10-CM

## 2017-01-24 DIAGNOSIS — Z79899 Other long term (current) drug therapy: Secondary | ICD-10-CM | POA: Insufficient documentation

## 2017-01-24 DIAGNOSIS — Z7984 Long term (current) use of oral hypoglycemic drugs: Secondary | ICD-10-CM | POA: Insufficient documentation

## 2017-01-24 DIAGNOSIS — Z7982 Long term (current) use of aspirin: Secondary | ICD-10-CM | POA: Insufficient documentation

## 2017-01-24 DIAGNOSIS — Z23 Encounter for immunization: Secondary | ICD-10-CM | POA: Insufficient documentation

## 2017-01-24 DIAGNOSIS — Z794 Long term (current) use of insulin: Secondary | ICD-10-CM | POA: Diagnosis not present

## 2017-01-24 LAB — GLUCOSE, POCT (MANUAL RESULT ENTRY): POC Glucose: 115 mg/dl — AB (ref 70–99)

## 2017-01-24 MED ORDER — METFORMIN HCL 500 MG PO TABS: 500.0000 mg | ORAL_TABLET | Freq: Two times a day (BID) | ORAL | 3 refills | Status: DC

## 2017-01-24 MED ORDER — LISINOPRIL-HYDROCHLOROTHIAZIDE 10-12.5 MG PO TABS: 1.0000 | ORAL_TABLET | Freq: Every day | ORAL | 3 refills | Status: DC

## 2017-01-24 MED ORDER — PRAVASTATIN SODIUM 20 MG PO TABS: 20.0000 mg | ORAL_TABLET | Freq: Every day | ORAL | 3 refills | Status: DC

## 2017-01-24 MED ORDER — GLIPIZIDE ER 5 MG PO TB24: ORAL_TABLET | ORAL | 3 refills | Status: DC

## 2017-01-24 MED ORDER — METOPROLOL SUCCINATE ER 50 MG PO TB24: 50.0000 mg | ORAL_TABLET | Freq: Every day | ORAL | 3 refills | Status: DC

## 2017-01-24 NOTE — Progress Notes (Signed)
Patient ID: Cindy Taylor, female    DOB: 03/14/1946  MRN: [id number]  CC: re-establish; Diabetes; and Hypertension   Subjective: Cindy Taylor is a 71 y.o. female who presents for chronic disease management. Her concerns today include:  Pt with DM, HTN, HL, Hep C treated with Harvoni, and unintentional wgh loss. 1. DM: Not checking BS regularly Compliant with meds: Glipizide and metformin -doing well with eating habits. Does house keeping 5 days a wk and feels he gets physical activity without. -eye exam with Dr. Juliann Mule in May. No changes of DM on the eye.   2. HTN: Compliant with meds: Lisinopril/HCTZ and metoprolol -no device to check blood pressure No chest pain/shortness of breath/lower extremity edema  3. HL: taking Pravastatin Denies any muscle cramps or aches with this medication  She has not had any further weight loss since last visit Patient Active Problem List   Diagnosis Date Noted  . Unintentional weight loss 10/22/2016  . Chews tobacco 01/13/2016  . Onychomycosis of multiple toenails with type 2 diabetes mellitus ([place]) 01/13/2016  . History of hepatitis C 05/23/2014  . DM type 2 (diabetes mellitus, type 2) ([place]) 11/16/2012  . Dyslipidemia 11/16/2012  . Essential hypertension, benign 12/12/2008     Current Outpatient Prescriptions on File Prior to Visit  Medication Sig Dispense Refill  . aspirin 81 MG tablet Take 1 tablet (81 mg total) by mouth daily. 90 tablet 3  . Blood Glucose Monitoring Suppl (ONE TOUCH ULTRA MINI) w/Device KIT 1 each by Other route 2 (two) times daily. ICD 10 E11.9 1 each 0  . Calcium Citrate-Vitamin D 250-200 MG-UNIT TABS Take 1 tablet by mouth 2 (two) times daily with a meal.    . ciclopirox (PENLAC) 8 % solution Apply topically at bedtime. Apply over nail and surrounding skin. Apply daily over previous coat. After seven (7) days, may remove with alcohol and continue cycle. 6.6 mL 0  . glucose blood (ONE TOUCH ULTRA TEST) test  strip 1 each by Other route 2 (two) times daily. ICD 10 E11.9 100 each 1  . ONE TOUCH LANCETS MISC 1 each by Does not apply route 2 (two) times daily. ICD 10 E11.9 200 each 1   No current facility-administered medications on file prior to visit.     No Known Allergies  Social History   Social History  . Marital status: Single    Spouse name: N/A  . Number of children: N/A  . Years of education: N/A   Occupational History  . Not on file.   Social History Main Topics  . Smoking status: Never Smoker  . Smokeless tobacco: Current User    Types: Snuff  . Alcohol use No     Comment: glass of wine at special occasion  . Drug use: No  . Sexual activity: Not on file   Other Topics Concern  . Not on file   Social History Narrative  . No narrative on file    Family History  Problem Relation Age of Onset  . Stroke Mother     No past surgical history on file.  ROS: Review of Systems Negative except as stated above PHYSICAL EXAM: BP 132/80   Pulse 92   Temp 98.3 F (36.8 C) (Oral)   Resp 16   Wt 147 lb (66.7 kg)   SpO2 97%   BMI 26.04 kg/m   132/80 Wt Readings from Last 3 Encounters:  01/24/17 147 lb (66.7 kg)  10/22/16 147 lb 6.4  oz (66.9 kg)  01/13/16 165 lb 3.2 oz (74.9 kg)    Physical Exam  General appearance - alert, well appearing, and in no distress Mental status - alert, oriented to person, place, and time, normal mood, behavior, speech, dress, motor activity, and thought processes Eyes - pupils equal and reactive, extraocular eye movements intact Mouth - mucous membranes moist, pharynx normal without lesions Neck - supple, no significant adenopathy Chest - clear to auscultation, no wheezes, rales or rhonchi, symmetric air entry Heart - normal rate, regular rhythm, normal S1, S2, no murmurs, rubs, clicks or gallops Extremities - peripheral pulses normal, no pedal edema, no clubbing or cyanosis Diabetic Foot Exam - Simple   Simple Foot Form Visual  Inspection No deformities, no ulcerations, no other skin breakdown bilaterally:  Yes Sensation Testing Intact to touch and monofilament testing bilaterally:  Yes Pulse Check Posterior Tibialis and Dorsalis pulse intact bilaterally:  Yes Comments     Results for orders placed or performed in visit on 01/24/17  POCT glucose (manual entry)  Result Value Ref Range   POC Glucose 115 (A) 70 - 99 mg/dl   Lab Results  Component Value Date   HGBA1C 6.2 10/22/2016     ASSESSMENT AND PLAN: 1. Type 2 diabetes mellitus without complication, with long-term current use of insulin (HCC) -Last A1c at goal. Blood sugar today is good. Continue current medications and healthy eating habits - POCT glucose (manual entry) - CBC - Comprehensive metabolic panel - Lipid panel - glipiZIDE (GLUCOTROL XL) 5 MG 24 hr tablet; take 1 tablet by mouth once daily WITH BREAKFAST  Dispense: 90 tablet; Refill: 3 - metFORMIN (GLUCOPHAGE) 500 MG tablet; Take 1 tablet (500 mg total) by mouth 2 (two) times daily with a meal.  Dispense: 180 tablet; Refill: 3  2. Essential hypertension, benign At goal - metoprolol succinate (TOPROL-XL) 50 MG 24 hr tablet; Take 1 tablet (50 mg total) by mouth daily. Take with or immediately following a meal.  Dispense: 90 tablet; Refill: 3 - lisinopril-hydrochlorothiazide (PRINZIDE,ZESTORETIC) 10-12.5 MG tablet; Take 1 tablet by mouth daily.  Dispense: 90 tablet; Refill: 3  3. Dyslipidemia - pravastatin (PRAVACHOL) 20 MG tablet; Take 1 tablet (20 mg total) by mouth daily.  Dispense: 90 tablet; Refill: 3  4. Need for influenza vaccination - Flu Vaccine QUAD 6+ mos PF IM (Fluarix Quad PF)  5. Colon cancer screening  Ambulatory referral to Gastroenterology   Patient was given the opportunity to ask questions.  Patient verbalized understanding of the plan and was able to repeat key elements of the plan.   Orders Placed This Encounter  Procedures  . Flu Vaccine QUAD 6+ mos PF IM  (Fluarix Quad PF)  . CBC  . Comprehensive metabolic panel  . Lipid panel  . Ambulatory referral to Gastroenterology  . POCT glucose (manual entry)     Requested Prescriptions   Signed Prescriptions Disp Refills  . glipiZIDE (GLUCOTROL XL) 5 MG 24 hr tablet 90 tablet 3    Sig: take 1 tablet by mouth once daily WITH BREAKFAST  . metFORMIN (GLUCOPHAGE) 500 MG tablet 180 tablet 3    Sig: Take 1 tablet (500 mg total) by mouth 2 (two) times daily with a meal.  . metoprolol succinate (TOPROL-XL) 50 MG 24 hr tablet 90 tablet 3    Sig: Take 1 tablet (50 mg total) by mouth daily. Take with or immediately following a meal.  . pravastatin (PRAVACHOL) 20 MG tablet 90 tablet 3  Sig: Take 1 tablet (20 mg total) by mouth daily.  Marland Kitchen lisinopril-hydrochlorothiazide (PRINZIDE,ZESTORETIC) 10-12.5 MG tablet 90 tablet 3    Sig: Take 1 tablet by mouth daily.    Return in about 3 months (around 04/26/2017).  Karle Plumber, MD, FACP

## 2017-01-25 DIAGNOSIS — Z23 Encounter for immunization: Secondary | ICD-10-CM | POA: Diagnosis not present

## 2017-01-25 LAB — COMPREHENSIVE METABOLIC PANEL
A/G RATIO: 1.6 (ref 1.2–2.2)
ALBUMIN: 4.6 g/dL (ref 3.6–4.8)
ALK PHOS: 51 IU/L (ref 39–117)
ALT: 12 IU/L (ref 0–32)
AST: 17 IU/L (ref 0–40)
BUN / CREAT RATIO: 12 (ref 12–28)
BUN: 12 mg/dL (ref 8–27)
Bilirubin Total: <0.2 mg/dL (ref 0.0–1.2)
CALCIUM: 9.5 mg/dL (ref 8.7–10.3)
CO2: 22 mmol/L (ref 20–29)
CREATININE: 1.01 mg/dL — AB (ref 0.57–1.00)
Chloride: 103 mmol/L (ref 96–106)
GFR calc Af Amer: 66 mL/min/{1.73_m2} (ref >59)
GFR, EST NON AFRICAN AMERICAN: 57 mL/min/{1.73_m2} — AB (ref >59)
GLOBULIN, TOTAL: 2.8 g/dL (ref 1.5–4.5)
Glucose: 75 mg/dL (ref 65–99)
POTASSIUM: 4 mmol/L (ref 3.5–5.2)
SODIUM: 143 mmol/L (ref 134–144)
Total Protein: 7.4 g/dL (ref 6.0–8.5)

## 2017-01-25 LAB — CBC
HEMATOCRIT: 41 % (ref 34.0–46.6)
HEMOGLOBIN: 13.1 g/dL (ref 11.1–15.9)
MCH: 26.7 pg (ref 26.6–33.0)
MCHC: 32 g/dL (ref 31.5–35.7)
MCV: 84 fL (ref 79–97)
Platelets: 187 10*3/uL (ref 150–379)
RBC: 4.91 x10E6/uL (ref 3.77–5.28)
RDW: 15.4 % (ref 12.3–15.4)
WBC: 7.5 10*3/uL (ref 3.4–10.8)

## 2017-01-25 LAB — LIPID PANEL
CHOL/HDL RATIO: 2.9 ratio (ref 0.0–4.4)
Cholesterol, Total: 158 mg/dL (ref 100–199)
HDL: 55 mg/dL (ref >39)
LDL CALC: 78 mg/dL (ref 0–99)
Triglycerides: 126 mg/dL (ref 0–149)
VLDL Cholesterol Cal: 25 mg/dL (ref 5–40)

## 2017-01-27 ENCOUNTER — Telehealth: Payer: Self-pay

## 2017-01-27 NOTE — Telephone Encounter (Signed)
Contacted pt to go over lab results pt didn't answer lvm informing pt of results and if she has any questions or concerns to give me a call  If pt calls back please give results: blood count, kidney and liver function tests and cholesterol levels are normal.

## 2017-02-01 ENCOUNTER — Encounter: Payer: Self-pay | Admitting: Internal Medicine

## 2017-02-14 ENCOUNTER — Ambulatory Visit
Admission: RE | Admit: 2017-02-14 | Discharge: 2017-02-14 | Disposition: A | Discharge status: Home / Self Care | Payer: Medicare Other | Source: Ambulatory Visit | Attending: Obstetrics & Gynecology | Admitting: Obstetrics & Gynecology

## 2017-02-14 DIAGNOSIS — Z1239 Encounter for other screening for malignant neoplasm of breast: Secondary | ICD-10-CM

## 2017-02-15 ENCOUNTER — Encounter (HOSPITAL_COMMUNITY): Payer: Self-pay | Admitting: Family Medicine

## 2017-02-15 ENCOUNTER — Ambulatory Visit (HOSPITAL_COMMUNITY)
Admission: EM | Admit: 2017-02-15 | Discharge: 2017-02-15 | Disposition: A | Discharge status: Home / Self Care | Payer: Medicare Other | Attending: Family Medicine | Admitting: Family Medicine

## 2017-02-15 DIAGNOSIS — L509 Urticaria, unspecified: Secondary | ICD-10-CM

## 2017-02-15 MED ORDER — PREDNISONE 10 MG (21) PO TBPK: ORAL_TABLET | Freq: Every day | ORAL | 0 refills | Status: DC

## 2017-02-15 NOTE — ED Triage Notes (Signed)
Pt here for hives on different places all over her body. Reports she has been using OTC meds without relief. Reports itching.

## 2017-02-16 NOTE — ED Provider Notes (Signed)
  Hailey   [id number] 02/15/17 Arrival Time: 3474  ASSESSMENT & PLAN:  1. Hives    Meds ordered this encounter  Medications  . predniSONE (STERAPRED UNI-PAK 21 TAB) 10 MG (21) TBPK tablet    Sig: Take by mouth daily. Take as directed.    Dispense:  21 tablet    Refill:  0   OTC Benadryl as needed. Will f/u 24-48 hours if not showing significant improvement, sooner if needed. Reviewed expectations re: course of current medical issues. Questions answered. Outlined signs and symptoms indicating need for more acute intervention. Patient verbalized understanding. After Visit Summary given.   SUBJECTIVE:  Antonela Freiman is a 70 y.o. female who presents with complaint of possible hives. On and off for the past 1-2 weeks. Unknown trigger. Similar in past but has been a long time. Non-painful. Much itching. Afebrile. No new exposures known. OTC Benadryl with minimal help. No n/v.  ROS: As per HPI.  OBJECTIVE: Vitals:   02/15/17 1729  BP: (!) 181/80  Pulse: 71  Resp: 18  Temp: 98.3 F (36.8 C)  SpO2: 99%    General appearance: alert; no distress Lungs: clear to auscultation bilaterally Heart: regular rate and rhythm Extremities: no edema Skin: warm and dry; diffuse wheals over trunk and extremities; + dermatographism Psychological: alert and cooperative; normal mood and affect   No Known Allergies  Past Medical History:  Diagnosis Date  . Diabetes mellitus    mellitus? x 9 years  . Hepatitis C   . HTN (hypertension)    x8 years  . Hyperlipidemia    Social History   Social History  . Marital status: Single    Spouse name: N/A  . Number of children: N/A  . Years of education: N/A   Occupational History  . Not on file.   Social History Main Topics  . Smoking status: Never Smoker  . Smokeless tobacco: Current User    Types: Snuff  . Alcohol use No     Comment: glass of wine at special occasion  . Drug use: No  . Sexual activity: Not on  file   Other Topics Concern  . Not on file   Social History Narrative  . No narrative on file   Family History  Problem Relation Age of Onset  . Stroke Mother       Vanessa Kick, MD 02/16/17 [phone]

## 2017-03-01 ENCOUNTER — Other Ambulatory Visit: Payer: Self-pay

## 2017-03-01 ENCOUNTER — Encounter (HOSPITAL_COMMUNITY): Payer: Self-pay | Admitting: Emergency Medicine

## 2017-03-01 ENCOUNTER — Ambulatory Visit (HOSPITAL_COMMUNITY)
Admission: EM | Admit: 2017-03-01 | Discharge: 2017-03-01 | Disposition: A | Discharge status: Home / Self Care | Payer: Medicare Other | Attending: Internal Medicine | Admitting: Internal Medicine

## 2017-03-01 DIAGNOSIS — S40262A Insect bite (nonvenomous) of left shoulder, initial encounter: Secondary | ICD-10-CM

## 2017-03-01 DIAGNOSIS — R21 Rash and other nonspecific skin eruption: Secondary | ICD-10-CM

## 2017-03-01 DIAGNOSIS — S40861A Insect bite (nonvenomous) of right upper arm, initial encounter: Secondary | ICD-10-CM | POA: Diagnosis not present

## 2017-03-01 DIAGNOSIS — W57XXXA Bitten or stung by nonvenomous insect and other nonvenomous arthropods, initial encounter: Secondary | ICD-10-CM | POA: Diagnosis not present

## 2017-03-01 MED ORDER — TRIAMCINOLONE ACETONIDE 0.1 % EX CREA: 1.0000 "application " | TOPICAL_CREAM | Freq: Two times a day (BID) | CUTANEOUS | 0 refills | Status: AC

## 2017-03-01 MED ORDER — PREDNISONE 10 MG (21) PO TBPK: ORAL_TABLET | Freq: Every day | ORAL | 0 refills | Status: DC

## 2017-03-01 NOTE — ED Provider Notes (Signed)
[facility]    CSN: [id number] Arrival date & time: 03/01/17  1534     History   Chief Complaint Chief Complaint  Patient presents with  . Urticaria    HPI Cindy Taylor is a 71 y.o. female.   Presents with complaint of hives for duration of a few weeks. She also had hives 2 weeks ago and was seem here given prednisone and rash went away but came back 3 days after completing her prednisone. Rash itches real bad. Itchiness worse at night. Denies any known trigger. Denies any changes in household or personal product. Afebrile. No systemic symptoms.  HPI  Past Medical History:  Diagnosis Date  . Diabetes mellitus    mellitus? x 9 years  . Hepatitis C   . HTN (hypertension)    x8 years  . Hyperlipidemia     Patient Active Problem List   Diagnosis Date Noted  . Unintentional weight loss 10/22/2016  . Chews tobacco 01/13/2016  . Onychomycosis of multiple toenails with type 2 diabetes mellitus ([place]) 01/13/2016  . History of hepatitis C 05/23/2014  . DM type 2 (diabetes mellitus, type 2) ([place]) 11/16/2012  . Dyslipidemia 11/16/2012  . Essential hypertension, benign 12/12/2008    History reviewed. No pertinent surgical history.  OB History    No data available       Home Medications    Prior to Admission medications   Medication Sig Start Date End Date Taking? Authorizing Provider  aspirin 81 MG tablet Take 1 tablet (81 mg total) by mouth daily. 10/22/16  Yes Funches, Josalyn, MD  Blood Glucose Monitoring Suppl (ONE TOUCH ULTRA MINI) w/Device KIT 1 each by Other route 2 (two) times daily. ICD 10 E11.9 01/13/16  Yes Funches, Josalyn, MD  Calcium Citrate-Vitamin D 250-200 MG-UNIT TABS Take 1 tablet by mouth 2 (two) times daily with a meal.   Yes [provider]  glipiZIDE (GLUCOTROL XL) 5 MG 24 hr tablet take 1 tablet by mouth once daily WITH BREAKFAST 01/24/17  Yes Ladell Pier, MD  glucose blood (ONE TOUCH ULTRA TEST) test strip 1 each by  Other route 2 (two) times daily. ICD 10 E11.9 10/22/16  Yes Funches, Josalyn, MD  lisinopril-hydrochlorothiazide (PRINZIDE,ZESTORETIC) 10-12.5 MG tablet Take 1 tablet by mouth daily. 01/24/17  Yes Ladell Pier, MD  metFORMIN (GLUCOPHAGE) 500 MG tablet Take 1 tablet (500 mg total) by mouth 2 (two) times daily with a meal. 01/24/17  Yes Ladell Pier, MD  metoprolol succinate (TOPROL-XL) 50 MG 24 hr tablet Take 1 tablet (50 mg total) by mouth daily. Take with or immediately following a meal. 01/24/17  Yes Ladell Pier, MD  ONE TOUCH LANCETS MISC 1 each by Does not apply route 2 (two) times daily. ICD 10 E11.9 10/22/16  Yes Funches, Josalyn, MD  pravastatin (PRAVACHOL) 20 MG tablet Take 1 tablet (20 mg total) by mouth daily. 01/24/17  Yes Ladell Pier, MD  ciclopirox (PENLAC) 8 % solution Apply topically at bedtime. Apply over nail and surrounding skin. Apply daily over previous coat. After seven (7) days, may remove with alcohol and continue cycle. 10/22/16   Funches, Adriana Mccallum, MD  predniSONE (STERAPRED UNI-PAK 21 TAB) 10 MG (21) TBPK tablet Take daily by mouth. Take as directed. 03/01/17   Barry Dienes, NP  triamcinolone cream (KENALOG) 0.1 % Apply 1 application 2 (two) times daily for 7 days topically. 03/01/17 03/08/17  Barry Dienes, NP    Family History Family History  Problem  Relation Age of Onset  . Stroke Mother     Social History Social History   Tobacco Use  . Smoking status: Never Smoker  . Smokeless tobacco: Current User    Types: Snuff  Substance Use Topics  . Alcohol use: No    Comment: glass of wine at special occasion  . Drug use: No     Allergies   Patient has no known allergies.   Review of Systems Review of Systems  Constitutional:       See HPI     Physical Exam Triage Vital Signs ED Triage Vitals [03/01/17 1550]  Enc Vitals Group     BP 126/64     Pulse Rate 74     Resp      Temp 98.9 F (37.2 C)     Temp Source Oral     SpO2 100 %      Weight      Height      Head Circumference      Peak Flow      Pain Score      Pain Loc      Pain Edu?      Excl. in [place]?    No data found.  Updated Vital Signs BP 126/64 (BP Location: Right Arm)   Pulse 74   Temp 98.9 F (37.2 C) (Oral)   SpO2 100%   Physical Exam  Constitutional: She is oriented to person, place, and time. She appears well-developed and well-nourished.  Cardiovascular: Normal rate.  Pulmonary/Chest: Effort normal.  Neurological: She is alert and oriented to person, place, and time.  Skin:  No urticaria noted on exam. Several distinct erythematous papules noted on right arm, some on left shoulder and 3 papules on neck.   Nursing note and vitals reviewed.    UC Treatments / Results  Labs (all labs ordered are listed, but only abnormal results are displayed) Labs Reviewed - No data to display  EKG  EKG Interpretation None       Radiology No results found.  Procedures Procedures (including critical care time)  Medications Ordered in UC Medications - No data to display   Initial Impression / Assessment and Plan / UC Course  I have reviewed the triage vital signs and the nursing notes.  Pertinent labs & imaging results that were available during my care of the patient were reviewed by me and considered in my medical decision making (see chart for details).  Final Clinical Impressions(s) / UC Diagnoses   Final diagnoses:  Insect bite, initial encounter   No urticaria noted on exam. Rash most consistent with insect bite. See RX below.  May take benadryl for itchiness. F/u with PCP for no improvement.   ED Discharge Orders        Ordered    predniSONE (STERAPRED UNI-PAK 21 TAB) 10 MG (21) TBPK tablet  Daily     03/01/17 1600    triamcinolone cream (KENALOG) 0.1 %  2 times daily     03/01/17 1600     Controlled Substance Prescriptions [place] Controlled Substance Registry consulted? Not Applicable   Barry Dienes, NP 03/01/17 1601

## 2017-03-01 NOTE — ED Triage Notes (Signed)
Pt was seen for these hives two weeks ago and was prescribed Prednisone.  She states they went away while she was on the medication, but two days after completing her course they came back.  She states she has pain and burning with these hives that keep her awake at night.

## 2017-04-04 ENCOUNTER — Encounter: Payer: Medicare Other | Admitting: Internal Medicine

## 2017-05-06 ENCOUNTER — Other Ambulatory Visit: Payer: Self-pay

## 2017-05-06 ENCOUNTER — Ambulatory Visit (AMBULATORY_SURGERY_CENTER): Payer: Self-pay | Admitting: *Deleted

## 2017-05-06 VITALS — Ht 63.0 in | Wt 150.6 lb

## 2017-05-06 DIAGNOSIS — Z1211 Encounter for screening for malignant neoplasm of colon: Secondary | ICD-10-CM

## 2017-05-06 MED ORDER — NA SULFATE-K SULFATE-MG SULF 17.5-3.13-1.6 GM/177ML PO SOLN: 1.0000 [IU] | Freq: Once | ORAL | 0 refills | Status: AC

## 2017-05-06 NOTE — Progress Notes (Signed)
No egg or soy allergy known to patient  No issues with past sedation with any surgeries  or procedures, no intubation problems  No diet pills per patient No home 02 use per patient  No blood thinners per patient  Pt denies issues with constipation  No A fib or A flutter  EMMI video sent to pt's e mail  Pt. declines 

## 2017-05-10 ENCOUNTER — Encounter: Payer: Self-pay | Admitting: Internal Medicine

## 2017-05-20 ENCOUNTER — Other Ambulatory Visit: Payer: Self-pay

## 2017-05-20 ENCOUNTER — Ambulatory Visit (AMBULATORY_SURGERY_CENTER): Payer: Medicare Other | Admitting: Internal Medicine

## 2017-05-20 ENCOUNTER — Encounter: Payer: Self-pay | Admitting: Internal Medicine

## 2017-05-20 VITALS — BP 106/60 | HR 63 | Temp 95.7°F | Resp 11 | Ht 63.0 in | Wt 150.0 lb

## 2017-05-20 DIAGNOSIS — Z1211 Encounter for screening for malignant neoplasm of colon: Secondary | ICD-10-CM | POA: Diagnosis not present

## 2017-05-20 DIAGNOSIS — Z1212 Encounter for screening for malignant neoplasm of rectum: Secondary | ICD-10-CM

## 2017-05-20 DIAGNOSIS — D125 Benign neoplasm of sigmoid colon: Secondary | ICD-10-CM

## 2017-05-20 DIAGNOSIS — K635 Polyp of colon: Secondary | ICD-10-CM | POA: Diagnosis not present

## 2017-05-20 DIAGNOSIS — D123 Benign neoplasm of transverse colon: Secondary | ICD-10-CM

## 2017-05-20 MED ORDER — SODIUM CHLORIDE 0.9 % IV SOLN: 500.0000 mL | Freq: Once | INTRAVENOUS | Status: DC

## 2017-05-20 NOTE — Progress Notes (Signed)
Report to PACU, RN, vss, BBS= Clear.  

## 2017-05-20 NOTE — Progress Notes (Signed)
Called to room to assist during endoscopic procedure.  Patient ID and intended procedure confirmed with present staff. Received instructions for my participation in the procedure from the performing physician.  

## 2017-05-20 NOTE — Patient Instructions (Signed)
YOU HAD AN ENDOSCOPIC PROCEDURE TODAY AT THE [facility]:   Refer to the procedure report that was given to you for any specific questions about what was found during the examination.  If the procedure report does not answer your questions, please call your gastroenterologist to clarify.  If you requested that your care partner not be given the details of your procedure findings, then the procedure report has been included in a sealed envelope for you to review at your convenience later.  YOU SHOULD EXPECT: Some feelings of bloating in the abdomen. Passage of more gas than usual.  Walking can help get rid of the air that was put into your GI tract during the procedure and reduce the bloating. If you had a lower endoscopy (such as a colonoscopy or flexible sigmoidoscopy) you may notice spotting of blood in your stool or on the toilet paper. If you underwent a bowel prep for your procedure, you may not have a normal bowel movement for a few days.  Please Note:  You might notice some irritation and congestion in your nose or some drainage.  This is from the oxygen used during your procedure.  There is no need for concern and it should clear up in a day or so.  SYMPTOMS TO REPORT IMMEDIATELY:   Following lower endoscopy (colonoscopy or flexible sigmoidoscopy):  Excessive amounts of blood in the stool  Significant tenderness or worsening of abdominal pains  Swelling of the abdomen that is new, acute  Fever of 100F or higher  For urgent or emergent issues, a gastroenterologist can be reached at any hour by calling [phone].   DIET:  We do recommend a small meal at first, but then you may proceed to your regular diet.  Drink plenty of fluids but you should avoid alcoholic beverages for 24 hours.  ACTIVITY:  You should plan to take it easy for the rest of today and you should NOT DRIVE or use heavy machinery until tomorrow (because of the sedation medicines used during the test).     FOLLOW UP: Our staff will call the number listed on your records the next business day following your procedure to check on you and address any questions or concerns that you may have regarding the information given to you following your procedure. If we do not reach you, we will leave a message.  However, if you are feeling well and you are not experiencing any problems, there is no need to return our call.  We will assume that you have returned to your regular daily activities without incident.  If any biopsies were taken you will be contacted by phone or by letter within the next 1-3 weeks.  Please call us at [phone] if you have not heard about the biopsies in 3 weeks.   Await for biopsy results to determine next repeat Colonoscopy screening Polyps (handout given) Diverticulosis (handout given) Hemorrhoids (handout given)   SIGNATURES/CONFIDENTIALITY: You and/or your care partner have signed paperwork which will be entered into your electronic medical record.  These signatures attest to the fact that that the information above on your After Visit Summary has been reviewed and is understood.  Full responsibility of the confidentiality of this discharge information lies with you and/or your care-partner. 

## 2017-05-20 NOTE — Progress Notes (Signed)
Pt's states no medical or surgical changes since previsit or office visit. 

## 2017-05-20 NOTE — Op Note (Signed)
[facility] Patient Name: Cindy Taylor Procedure Date: 05/20/2017 11:24 AM MRN: [id number] Endoscopist: Jerene Bears , MD Age: 72 Referring MD:  Date of Birth: 1945/06/10 Gender: Female Account #: [id number] Procedure:                Colonoscopy Indications:              Screening for colorectal malignant neoplasm, Last                            colonoscopy 10 years ago Medicines:                Monitored Anesthesia Care Procedure:                Pre-Anesthesia Assessment:                           - Prior to the procedure, a History and Physical                            was performed, and patient medications and                            allergies were reviewed. The patient's tolerance of                            previous anesthesia was also reviewed. The risks                            and benefits of the procedure and the sedation                            options and risks were discussed with the patient.                            All questions were answered, and informed consent                            was obtained. Prior Anticoagulants: The patient has                            taken no previous anticoagulant or antiplatelet                            agents. ASA Grade Assessment: II - A patient with                            mild systemic disease. After reviewing the risks                            and benefits, the patient was deemed in                            satisfactory condition to undergo the procedure.  After obtaining informed consent, the colonoscope                            was passed under direct vision. Throughout the                            procedure, the patient's blood pressure, pulse, and                            oxygen saturations were monitored continuously. The                            Model PCF-[id number]) scope was introduced                            through the anus and advanced to  the the cecum,                            identified by appendiceal orifice and ileocecal                            valve. The colonoscopy was performed without                            difficulty. The patient tolerated the procedure                            well. The quality of the bowel preparation was                            good. The ileocecal valve, appendiceal orifice, and                            rectum were photographed. Scope In: 11:30:00 AM Scope Out: 11:41:50 AM Scope Withdrawal Time: 0 hours 8 minutes 18 seconds  Total Procedure Duration: 0 hours 11 minutes 50 seconds  Findings:                 The digital rectal exam was normal.                           A 5 mm polyp was found in the proximal transverse                            colon. The polyp was sessile. The polyp was removed                            with a cold snare. Resection and retrieval were                            complete.                           A 2 mm polyp was found in the sigmoid colon. The  polyp was sessile. The polyp was removed with a                            cold snare. Resection and retrieval were complete.                           Multiple small and large-mouthed diverticula were                            found from cecum to sigmoid colon.                           Internal hemorrhoids were found during                            retroflexion. The hemorrhoids were small. Complications:            No immediate complications. Estimated Blood Loss:     Estimated blood loss was minimal. Impression:               - One 5 mm polyp in the proximal transverse colon,                            removed with a cold snare. Resected and retrieved.                           - One 2 mm polyp in the sigmoid colon, removed with                            a cold snare. Resected and retrieved.                           - Moderate diverticulosis from cecum to sigmoid                             colon.                           - Small internal hemorrhoids. Recommendation:           - Patient has a contact number available for                            emergencies. The signs and symptoms of potential                            delayed complications were discussed with the                            patient. Return to normal activities tomorrow.                            Written discharge instructions were provided to the  patient.                           - Resume previous diet.                           - Continue present medications.                           - Await pathology results.                           - Repeat colonoscopy is recommended. The                            colonoscopy date will be determined after pathology                            results from today's exam become available for                            review. Jerene Bears, MD 05/20/2017 11:44:54 AM This report has been signed electronically.

## 2017-05-23 ENCOUNTER — Telehealth: Payer: Self-pay

## 2017-05-23 NOTE — Telephone Encounter (Signed)
  Follow up Call-  Call back number 05/20/2017  Post procedure Call Back phone  # [phone]  Permission to leave phone message Yes  Some recent data might be hidden     [facility] Angela/Follow-up call

## 2017-05-23 NOTE — Telephone Encounter (Signed)
NO ANSWER, MESSAGE LEFT FOR PATIENT. 

## 2017-05-25 ENCOUNTER — Encounter: Payer: Self-pay | Admitting: Internal Medicine

## 2017-07-08 ENCOUNTER — Encounter (HOSPITAL_COMMUNITY): Payer: Self-pay | Admitting: Emergency Medicine

## 2017-07-08 ENCOUNTER — Ambulatory Visit (HOSPITAL_COMMUNITY)
Admission: EM | Admit: 2017-07-08 | Discharge: 2017-07-08 | Disposition: A | Discharge status: Home / Self Care | Payer: Medicare Other | Attending: Emergency Medicine | Admitting: Emergency Medicine

## 2017-07-08 ENCOUNTER — Other Ambulatory Visit: Payer: Self-pay

## 2017-07-08 DIAGNOSIS — S0501XA Injury of conjunctiva and corneal abrasion without foreign body, right eye, initial encounter: Secondary | ICD-10-CM | POA: Diagnosis not present

## 2017-07-08 MED ORDER — IBUPROFEN 600 MG PO TABS: 600.0000 mg | ORAL_TABLET | Freq: Four times a day (QID) | ORAL | 0 refills | Status: DC | PRN

## 2017-07-08 MED ORDER — HYDROCODONE-ACETAMINOPHEN 5-325 MG PO TABS: 1.0000 | ORAL_TABLET | Freq: Four times a day (QID) | ORAL | 0 refills | Status: DC | PRN

## 2017-07-08 MED ORDER — FLUORESCEIN SODIUM 1 MG OP STRP
ORAL_STRIP | OPHTHALMIC | Status: AC
Start: 2017-07-08 — End: ?
  Filled 2017-07-08: qty 1

## 2017-07-08 MED ORDER — TETRACAINE HCL 0.5 % OP SOLN
OPHTHALMIC | Status: AC
Start: 2017-07-08 — End: ?
  Filled 2017-07-08: qty 4

## 2017-07-08 MED ORDER — KETOROLAC TROMETHAMINE 0.5 % OP SOLN: OPHTHALMIC | 0 refills | Status: DC

## 2017-07-08 MED ORDER — MOXIFLOXACIN HCL 0.5 % OP SOLN: 1.0000 [drp] | Freq: Three times a day (TID) | OPHTHALMIC | 0 refills | Status: DC

## 2017-07-08 NOTE — ED Provider Notes (Signed)
HPI  SUBJECTIVE:  Cindy Taylor is a 72 y.o. female who presents with 2 days of right eye pain described as sharp.  It started off as intermittent has become constant today.  She states she has a foreign body sensation in her eye, increased tearing, photophobia.  She does report some nasal congestion, cough, postnasal drip.  She tried Systane eyedrops with worsening of her symptoms.  Symptoms are also worse with extraocular movements, opening her on.  Symptoms are better with closing her eye.  She also tried Visine without improvement in her symptoms.  No headache, fevers, crusting, discharge, erythema or swelling around her eyes,, visual changes, pain worse in the dark, halos around lights.  No contacts with similar symptoms.  She does not wear contacts.  She denies rubbing her eyes.  No exposure to chemicals.  No known trauma.  She states that this feels identical to previous corneal abrasion that she had when she excellently scratched her eye in her sleep.  She has a past medical history of diabetes, hypertension, hepatitis C, treated.  No residual liver disease.  No history of glaucoma.  Tetanus is up-to-date.  PMD: Ladell Pier, MD Ophthalmology: Dr. Mikle Bosworth.    Past Medical History:  Diagnosis Date  . Arthritis   . Diabetes mellitus    mellitus? x 9 years  . Hepatitis C   . HTN (hypertension)    x8 years  . Hyperlipidemia     Past Surgical History:  Procedure Laterality Date  . TOTAL ABDOMINAL HYSTERECTOMY    . TUBAL LIGATION      Family History  Problem Relation Age of Onset  . Stroke Mother   . Rectal cancer Mother   . Colon polyps Neg Hx   . Colon cancer Neg Hx   . Esophageal cancer Neg Hx   . Stomach cancer Neg Hx     Social History   Tobacco Use  . Smoking status: Never Smoker  . Smokeless tobacco: Current User    Types: Snuff  Substance Use Topics  . Alcohol use: No    Comment: glass of wine at special occasion  . Drug use: No    No current  facility-administered medications for this encounter.   Current Outpatient Medications:  .  aspirin 81 MG tablet, Take 1 tablet (81 mg total) by mouth daily., Disp: 90 tablet, Rfl: 3 .  Blood Glucose Monitoring Suppl (ONE TOUCH ULTRA MINI) w/Device KIT, 1 each by Other route 2 (two) times daily. ICD 10 E11.9, Disp: 1 each, Rfl: 0 .  Calcium Citrate-Vitamin D 250-200 MG-UNIT TABS, Take 1 tablet by mouth 2 (two) times daily with a meal., Disp: , Rfl:  .  glipiZIDE (GLUCOTROL XL) 5 MG 24 hr tablet, take 1 tablet by mouth once daily WITH BREAKFAST, Disp: 90 tablet, Rfl: 3 .  glucose blood (ONE TOUCH ULTRA TEST) test strip, 1 each by Other route 2 (two) times daily. ICD 10 E11.9, Disp: 100 each, Rfl: 1 .  HYDROcodone-acetaminophen (NORCO/VICODIN) 5-325 MG tablet, Take 1-2 tablets by mouth every 6 (six) hours as needed for moderate pain or severe pain., Disp: 12 tablet, Rfl: 0 .  ibuprofen (ADVIL,MOTRIN) 600 MG tablet, Take 1 tablet (600 mg total) by mouth every 6 (six) hours as needed., Disp: 30 tablet, Rfl: 0 .  ketorolac (ACULAR) 0.5 % ophthalmic solution, 1 drop in affected eye 4 times a day x 5 days, Disp: 3 mL, Rfl: 0 .  lisinopril-hydrochlorothiazide (PRINZIDE,ZESTORETIC) 10-12.5 MG tablet, Take  1 tablet by mouth daily., Disp: 90 tablet, Rfl: 3 .  metFORMIN (GLUCOPHAGE) 500 MG tablet, Take 1 tablet (500 mg total) by mouth 2 (two) times daily with a meal., Disp: 180 tablet, Rfl: 3 .  metoprolol succinate (TOPROL-XL) 50 MG 24 hr tablet, Take 1 tablet (50 mg total) by mouth daily. Take with or immediately following a meal., Disp: 90 tablet, Rfl: 3 .  moxifloxacin (VIGAMOX) 0.5 % ophthalmic solution, Place 1 drop into both eyes 3 (three) times daily. X 7 days, Disp: 3 mL, Rfl: 0 .  ONE TOUCH LANCETS MISC, 1 each by Does not apply route 2 (two) times daily. ICD 10 E11.9, Disp: 200 each, Rfl: 1 .  pravastatin (PRAVACHOL) 20 MG tablet, Take 1 tablet (20 mg total) by mouth daily., Disp: 90 tablet, Rfl:  3  No Known Allergies   ROS  As noted in HPI.   Physical Exam  BP (!) 157/73 (BP Location: Left Arm)   Pulse 92   Temp 99.1 F (37.3 C) (Oral)   Resp 18   SpO2 100%   Constitutional: Well developed, well nourished, no acute distress Eyes:  EOMI, PERRLA, right-sided conjunctival injection with increased tearing.  No discharge.  No periorbital erythema, edema, tenderness.  Pain with EOMs.  No direct or consensual photophobia.  No foreign body seen on lid eversion.  Large corneal abrasion from the 6:00 to 9 o'clock position extending centrally over the cornea.  No foreign body seen on magnification.  Negative Seidel.   Visual Acuity  Right Eye Distance: 20/40 Left Eye Distance: 20/30 Bilateral Distance: 20/30  Right Eye Near:   Left Eye Near:    Bilateral Near:    HENT: Normocephalic, atraumatic,mucus membranes moist Respiratory: Normal inspiratory effort Cardiovascular: Normal rate GI: nondistended skin: No rash, skin intact Musculoskeletal: no deformities Neurologic: Alert & oriented x 3, no focal neuro deficits Psychiatric: Speech and behavior appropriate   ED Course   Medications - No data to display  Orders Placed This Encounter  Procedures  . Visual acuity screening    Standing Status:   Standing    Number of Occurrences:   1    No results found for this or any previous visit (from the past 24 hour(s)). No results found.  ED Clinical Impression  Abrasion of right cornea, initial encounter   ED Assessment/Plan  [place] Narcotic database reviewed for this patient, and feel that the risk/benefit ratio today is favorable for proceeding with a prescription for controlled substance.  No opiate prescriptions in 2 years.  Patient with a large corneal abrasion.  Home with Vigamox, ketorolac eyedrops.  Ibuprofen 600 mg take with 1 g of Tylenol if the ketorolac eyedrops are too expensive.  Norco for severe pain only.  Discussed with patient to take either Tylenol or  Norco, not both.  Will refer to Dr. Wyatt Portela ophthalmology on call as she is not sure if she can be seen on Monday by her ophthalmologist.  He is closed today.  Her tetanus is up-to-date.  Discussed MDM, plan and followup with patient. Discussed sn/sx that should prompt return to the ED. patient agrees with plan.   Meds ordered this encounter  Medications  . ketorolac (ACULAR) 0.5 % ophthalmic solution    Sig: 1 drop in affected eye 4 times a day x 5 days    Dispense:  3 mL    Refill:  0  . moxifloxacin (VIGAMOX) 0.5 % ophthalmic solution    Sig: Place 1  drop into both eyes 3 (three) times daily. X 7 days    Dispense:  3 mL    Refill:  0  . ibuprofen (ADVIL,MOTRIN) 600 MG tablet    Sig: Take 1 tablet (600 mg total) by mouth every 6 (six) hours as needed.    Dispense:  30 tablet    Refill:  0  . HYDROcodone-acetaminophen (NORCO/VICODIN) 5-325 MG tablet    Sig: Take 1-2 tablets by mouth every 6 (six) hours as needed for moderate pain or severe pain.    Dispense:  12 tablet    Refill:  0    *This clinic note was created using [employer]. Therefore, there may be occasional mistakes despite careful proofreading.   ?   Melynda Ripple, MD 07/08/17 1659

## 2017-07-08 NOTE — ED Triage Notes (Signed)
The patient presented to the [facility] with a complaint of right eye pain and drainage x 2 days.

## 2017-07-08 NOTE — Discharge Instructions (Addendum)
You have a scratch of the eye on the cornea (the clear part of the eye). This condition may be caused by trauma. It is a common problem for people who wear contact lenses. Proper treatment is important. No evidence of infection is noted today, but you could develop an infection called a corneal ulcer or have some retained foreign body that may or may not have been noted today in the ED. Ulcers are not only painful, but they may also scar the cornea and cause permanent damage to the eye.   Do not rub your eyes. You may use Systane or artifical tears as much as you want to for comfort. Wear sunglasses if lights are hurting your eyes.  Call Dr. Zenia Resides office today to schedule follow-up appointment for Monday.  Follow-up care is necessary to be sure the corneal abrasion is healing .   Vigamox eyedrops, which are antibiotic eyedrops., ketorolac eyedrops are for pain.  If they are too expensive, then take the ibuprofen with Tylenol 3-4 times a day.. Ibuprofen 600 mg take with 1 g of Tylenol, Norco for severe pain only.  take either Tylenol or Norco, not both.    Go to www.goodrx.com to look up your medications. This will give you a list of where you can find your prescriptions at the most affordable prices. Or ask the pharmacist what the cash price is, or if they have any other discount programs available to help make your medication more affordable. This can be less expensive than what you would pay with insurance.

## 2017-09-30 ENCOUNTER — Ambulatory Visit: Payer: Medicare Other | Admitting: Internal Medicine

## 2017-11-29 ENCOUNTER — Other Ambulatory Visit: Payer: Self-pay

## 2017-11-29 DIAGNOSIS — E119 Type 2 diabetes mellitus without complications: Secondary | ICD-10-CM

## 2017-11-29 DIAGNOSIS — Z794 Long term (current) use of insulin: Principal | ICD-10-CM

## 2017-11-29 MED ORDER — METFORMIN HCL 500 MG PO TABS: 500.0000 mg | ORAL_TABLET | Freq: Two times a day (BID) | ORAL | 0 refills | Status: DC

## 2017-12-06 ENCOUNTER — Ambulatory Visit: Payer: Medicare Other | Admitting: Internal Medicine

## 2017-12-09 ENCOUNTER — Encounter: Payer: Self-pay | Admitting: Internal Medicine

## 2017-12-09 ENCOUNTER — Ambulatory Visit: Payer: Medicare Other | Attending: Internal Medicine | Admitting: Internal Medicine

## 2017-12-09 VITALS — BP 111/69 | HR 71 | Temp 98.2°F | Resp 18 | Ht 63.0 in | Wt 152.0 lb

## 2017-12-09 DIAGNOSIS — L84 Corns and callosities: Secondary | ICD-10-CM | POA: Insufficient documentation

## 2017-12-09 DIAGNOSIS — Z7984 Long term (current) use of oral hypoglycemic drugs: Secondary | ICD-10-CM | POA: Insufficient documentation

## 2017-12-09 DIAGNOSIS — Z79899 Other long term (current) drug therapy: Secondary | ICD-10-CM | POA: Diagnosis not present

## 2017-12-09 DIAGNOSIS — Z23 Encounter for immunization: Secondary | ICD-10-CM | POA: Diagnosis not present

## 2017-12-09 DIAGNOSIS — Z7982 Long term (current) use of aspirin: Secondary | ICD-10-CM | POA: Diagnosis not present

## 2017-12-09 DIAGNOSIS — E785 Hyperlipidemia, unspecified: Secondary | ICD-10-CM | POA: Diagnosis not present

## 2017-12-09 DIAGNOSIS — E119 Type 2 diabetes mellitus without complications: Secondary | ICD-10-CM

## 2017-12-09 DIAGNOSIS — I10 Essential (primary) hypertension: Secondary | ICD-10-CM | POA: Insufficient documentation

## 2017-12-09 DIAGNOSIS — Z794 Long term (current) use of insulin: Secondary | ICD-10-CM

## 2017-12-09 LAB — POCT UA - MICROALBUMIN
Albumin/Creatinine Ratio, Urine, POC: <30
Creatinine, POC: 50 mg/dL
Microalbumin Ur, POC: 10 mg/L

## 2017-12-09 LAB — GLUCOSE, POCT (MANUAL RESULT ENTRY)
POC GLUCOSE: 65 mg/dL — AB (ref 70–99)
POC GLUCOSE: 95 mg/dL (ref 70–99)

## 2017-12-09 LAB — POCT GLYCOSYLATED HEMOGLOBIN (HGB A1C): HbA1c, POC (prediabetic range): 6.2 % (ref 5.7–6.4)

## 2017-12-09 MED ORDER — METFORMIN HCL 500 MG PO TABS: 500.0000 mg | ORAL_TABLET | Freq: Two times a day (BID) | ORAL | 6 refills | Status: DC

## 2017-12-09 MED ORDER — GLIPIZIDE ER 2.5 MG PO TB24: ORAL_TABLET | ORAL | 6 refills | Status: DC

## 2017-12-09 NOTE — Progress Notes (Signed)
Patient ID: Cindy Taylor, female    DOB: [birth year]  MRN: [id number]  CC: Diabetes   Subjective: Cindy Taylor is a 72 y.o. female who presents for chronic ds management. Her concerns today include:  Pt with DM, HTN, HL, Hep C treated with Harvoni  DM: meter not working.  Needs new one.  Blood sugar of 65 here in the office today.  She was not able to tell that her blood sugar was low.  She attributes the low blood sugar to having not eaten in about 4 hours. 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 Venetia Maxon).  No numbness in hands/feets  HTN: compliant with meds and salt restriction. NO CP/SOB/LE edema/dizziness.  HL:  Compliant with Pravastatin.  Patient Active Problem List   Diagnosis Date Noted  . Unintentional weight loss 10/22/2016  . Chews tobacco 01/13/2016  . Onychomycosis of multiple toenails with type 2 diabetes mellitus ([place]) 01/13/2016  . History of hepatitis C 05/23/2014  . DM type 2 (diabetes mellitus, type 2) ([place]) 11/16/2012  . Dyslipidemia 11/16/2012  . Essential hypertension, benign 12/12/2008     Current Outpatient Medications on File Prior to Visit  Medication Sig Dispense Refill  . aspirin 81 MG tablet Take 1 tablet (81 mg total) by mouth daily. 90 tablet 3  . Blood Glucose Monitoring Suppl (ONE TOUCH ULTRA MINI) w/Device KIT 1 each by Other route 2 (two) times daily. ICD 10 E11.9 1 each 0  . Calcium Citrate-Vitamin D 250-200 MG-UNIT TABS Take 1 tablet by mouth 2 (two) times daily with a meal.    . glipiZIDE (GLUCOTROL XL) 5 MG 24 hr tablet take 1 tablet by mouth once daily WITH BREAKFAST 90 tablet 3  . glucose blood (ONE TOUCH ULTRA TEST) test strip 1 each by Other route 2 (two) times daily. ICD 10 E11.9 100 each 1  . HYDROcodone-acetaminophen (NORCO/VICODIN) 5-325 MG tablet Take 1-2 tablets by mouth every 6 (six) hours as needed for  moderate pain or severe pain. 12 tablet 0  . ibuprofen (ADVIL,MOTRIN) 600 MG tablet Take 1 tablet (600 mg total) by mouth every 6 (six) hours as needed. 30 tablet 0  . ketorolac (ACULAR) 0.5 % ophthalmic solution 1 drop in affected eye 4 times a day x 5 days 3 mL 0  . lisinopril-hydrochlorothiazide (PRINZIDE,ZESTORETIC) 10-12.5 MG tablet Take 1 tablet by mouth daily. 90 tablet 3  . metFORMIN (GLUCOPHAGE) 500 MG tablet Take 1 tablet (500 mg total) by mouth 2 (two) times daily with a meal. MUST KEEP APPT ON  12/09/17 FOR FURTHER REFILLS 60 tablet 0  . metoprolol succinate (TOPROL-XL) 50 MG 24 hr tablet Take 1 tablet (50 mg total) by mouth daily. Take with or immediately following a meal. 90 tablet 3  . moxifloxacin (VIGAMOX) 0.5 % ophthalmic solution Place 1 drop into both eyes 3 (three) times daily. X 7 days 3 mL 0  . ONE TOUCH LANCETS MISC 1 each by Does not apply route 2 (two) times daily. ICD 10 E11.9 200 each 1  . pravastatin (PRAVACHOL) 20 MG tablet Take 1 tablet (20 mg total) by mouth daily. 90 tablet 3   No current facility-administered medications on file prior to visit.     No Known Allergies  Social History   Socioeconomic History  . Marital status: Single    Spouse name: Not on file  . Number of children: Not  on file  . Years of education: Not on file  . Highest education level: Not on file  Occupational History  . Not on file  Social Needs  . Financial resource strain: Not on file  . Food insecurity:    Worry: Not on file    Inability: Not on file  . Transportation needs:    Medical: Not on file    Non-medical: Not on file  Tobacco Use  . Smoking status: Never Smoker  . Smokeless tobacco: Current User    Types: Snuff  Substance and Sexual Activity  . Alcohol use: No    Comment: glass of wine at special occasion  . Drug use: No  . Sexual activity: Not on file  Lifestyle  . Physical activity:    Days per week: Not on file    Minutes per session: Not on file  .  Stress: Not on file  Relationships  . Social connections:    Talks on phone: Not on file    Gets together: Not on file    Attends religious service: Not on file    Active member of club or organization: Not on file    Attends meetings of clubs or organizations: Not on file    Relationship status: Not on file  . Intimate partner violence:    Fear of current or ex partner: Not on file    Emotionally abused: Not on file    Physically abused: Not on file    Forced sexual activity: Not on file  Other Topics Concern  . Not on file  Social History Narrative  . Not on file    Family History  Problem Relation Age of Onset  . Stroke Mother   . Rectal cancer Mother   . Colon polyps Neg Hx   . Colon cancer Neg Hx   . Esophageal cancer Neg Hx   . Stomach cancer Neg Hx     Past Surgical History:  Procedure Laterality Date  . TOTAL ABDOMINAL HYSTERECTOMY    . TUBAL LIGATION      ROS: Review of Systems  PHYSICAL EXAM: BP 111/69 (BP Location: Left Arm, Patient Position: Sitting, Cuff Size: Normal)   Pulse 71   Temp 98.2 F (36.8 C) (Oral)   Resp 18   Ht 5' 3" (1.6 m)   Wt 152 lb (68.9 kg)   SpO2 99%   BMI 26.93 kg/m   Wt Readings from Last 3 Encounters:  12/09/17 152 lb (68.9 kg)  05/20/17 150 lb (68 kg)  05/06/17 150 lb 9.6 oz (68.3 kg)   Physical Exam  General appearance - alert, well appearing, and in no distress Mental status - normal mood, behavior, speech, dress, motor activity, and thought processes Nose - normal and patent, no erythema, discharge or polyps Mouth - mucous membranes moist, pharynx normal without lesions Neck - supple, no significant adenopathy Chest - clear to auscultation, no wheezes, rales or rhonchi, symmetric air entry Extremities - peripheral pulses normal, no pedal edema, no clubbing or cyanosis Diabetic Foot Exam - Simple   Simple Foot Form Visual Inspection See comments:  Yes Sensation Testing Intact to touch and monofilament testing  bilaterally:  Yes Pulse Check Posterior Tibialis and Dorsalis pulse intact bilaterally:  Yes Comments Positive callus medially on both big toes.    Results for orders placed or performed in visit on 12/09/17  POCT glucose (manual entry)  Result Value Ref Range   POC Glucose 65 (A) 70 - 99 mg/dl  POCT glycosylated hemoglobin (Hb A1C)  Result Value Ref Range   Hemoglobin A1C     HbA1c POC (<> result, manual entry)     HbA1c, POC (prediabetic range) 6.2 5.7 - 6.4 %   HbA1c, POC (controlled diabetic range)    POCT UA - Microalbumin  Result Value Ref Range   Microalbumin Ur, POC 10 mg/L   Creatinine, POC 50 mg/dL   Albumin/Creatinine Ratio, Urine, POC <30    Repeat blood sugar after patient had eaten some crackers was 95  ASSESSMENT AND PLAN: 1. Type 2 diabetes mellitus without complication, with long-term current use of insulin ([place]) Patient has hypoglycemic unawareness.  We have decided to decrease the glipizide from 5 mg daily to 2.5 mg daily.  Will send in prescription for diabetic testing supplies. Continue healthy eating habits. - Microalbumin / creatinine urine ratio - POCT glucose (manual entry) - POCT glycosylated hemoglobin (Hb A1C) - POCT UA - Microalbumin - metFORMIN (GLUCOPHAGE) 500 MG tablet; Take 1 tablet (500 mg total) by mouth 2 (two) times daily with a meal.  Dispense: 60 tablet; Refill: 6 - glipiZIDE (GLUCOTROL XL) 2.5 MG 24 hr tablet; take 1 tablet by mouth once daily WITH BREAKFAST  Dispense: 30 tablet; Refill: 6  2. Essential hypertension At goal.  Continue current medications and DASH diet.  3. Need for influenza vaccination - Flu Vaccine QUAD 6+ mos PF IM (Fluarix Quad PF)  4. Pre-ulcerative corn or callous Patient declined referral to podiatry  5. Hyperlipidemia, unspecified hyperlipidemia type Continue pravastatin   Patient was given the opportunity to ask questions.  Patient verbalized understanding of the plan and was able to repeat key elements  of the plan.   Orders Placed This Encounter  Procedures  . Microalbumin / creatinine urine ratio  . POCT glucose (manual entry)  . POCT glycosylated hemoglobin (Hb A1C)  . POCT UA - Microalbumin     Requested Prescriptions    No prescriptions requested or ordered in this encounter    No follow-ups on file.  Karle Plumber, MD, FACP

## 2017-12-09 NOTE — Patient Instructions (Addendum)
Decrease Glipizide to 2.5 mg once a day.   Influenza Virus Vaccine injection (Fluarix) What is this medicine? INFLUENZA VIRUS VACCINE (in floo EN zuh VAHY ruhs vak SEEN) helps to reduce the risk of getting influenza also known as the flu. This medicine may be used for other purposes; ask your health care provider or pharmacist if you have questions. COMMON BRAND NAME(S): Fluarix, Fluzone What should I tell my health care provider before I take this medicine? They need to know if you have any of these conditions: -bleeding disorder like hemophilia -fever or infection -Guillain-Barre syndrome or other neurological problems -immune system problems -infection with the human immunodeficiency virus (HIV) or AIDS -low blood platelet counts -multiple sclerosis -an unusual or allergic reaction to influenza virus vaccine, eggs, chicken proteins, latex, gentamicin, other medicines, foods, dyes or preservatives -pregnant or trying to get pregnant -breast-feeding How should I use this medicine? This vaccine is for injection into a muscle. It is given by a health care professional. A copy of Vaccine Information Statements will be given before each vaccination. Read this sheet carefully each time. The sheet may change frequently. Talk to your pediatrician regarding the use of this medicine in children. Special care may be needed. Overdosage: If you think you have taken too much of this medicine contact a poison control center or emergency room at once. NOTE: This medicine is only for you. Do not share this medicine with others. What if I miss a dose? This does not apply. What may interact with this medicine? -chemotherapy or radiation therapy -medicines that lower your immune system like etanercept, anakinra, infliximab, and adalimumab -medicines that treat or prevent blood clots like warfarin -phenytoin -steroid medicines like prednisone or cortisone -theophylline -vaccines This list may not  describe all possible interactions. Give your health care provider a list of all the medicines, herbs, non-prescription drugs, or dietary supplements you use. Also tell them if you smoke, drink alcohol, or use illegal drugs. Some items may interact with your medicine. What should I watch for while using this medicine? Report any side effects that do not go away within 3 days to your doctor or health care professional. Call your health care provider if any unusual symptoms occur within 6 weeks of receiving this vaccine. You may still catch the flu, but the illness is not usually as bad. You cannot get the flu from the vaccine. The vaccine will not protect against colds or other illnesses that may cause fever. The vaccine is needed every year. What side effects may I notice from receiving this medicine? Side effects that you should report to your doctor or health care professional as soon as possible: -allergic reactions like skin rash, itching or hives, swelling of the face, lips, or tongue Side effects that usually do not require medical attention (report to your doctor or health care professional if they continue or are bothersome): -fever -headache -muscle aches and pains -pain, tenderness, redness, or swelling at site where injected -weak or tired This list may not describe all possible side effects. Call your doctor for medical advice about side effects. You may report side effects to FDA at 1-800-FDA-1088. Where should I keep my medicine? This vaccine is only given in a clinic, pharmacy, doctor's office, or other health care setting and will not be stored at home. NOTE: This sheet is a summary. It may not cover all possible information. If you have questions about this medicine, talk to your doctor, pharmacist, or health care provider.  2018 Elsevier/Gold Standard (2007-11-01 09:30:40)

## 2018-01-24 ENCOUNTER — Other Ambulatory Visit: Payer: Self-pay | Admitting: Internal Medicine

## 2018-01-24 DIAGNOSIS — Z1231 Encounter for screening mammogram for malignant neoplasm of breast: Secondary | ICD-10-CM

## 2018-02-28 ENCOUNTER — Ambulatory Visit
Admission: RE | Admit: 2018-02-28 | Discharge: 2018-02-28 | Disposition: A | Discharge status: Home / Self Care | Payer: Medicare Other | Source: Ambulatory Visit | Attending: Internal Medicine | Admitting: Internal Medicine

## 2018-02-28 DIAGNOSIS — Z1231 Encounter for screening mammogram for malignant neoplasm of breast: Secondary | ICD-10-CM

## 2018-04-07 ENCOUNTER — Ambulatory Visit: Payer: Medicare Other | Admitting: Internal Medicine

## 2018-04-17 ENCOUNTER — Ambulatory Visit: Payer: Medicare Other | Admitting: Internal Medicine

## 2018-05-29 ENCOUNTER — Encounter: Payer: Self-pay | Admitting: Internal Medicine

## 2018-05-29 ENCOUNTER — Ambulatory Visit: Payer: Medicare Other | Attending: Internal Medicine | Admitting: Internal Medicine

## 2018-05-29 VITALS — BP 162/88 | HR 70 | Temp 98.2°F | Resp 16 | Ht 63.0 in | Wt 149.0 lb

## 2018-05-29 DIAGNOSIS — Z0000 Encounter for general adult medical examination without abnormal findings: Secondary | ICD-10-CM | POA: Diagnosis not present

## 2018-05-29 DIAGNOSIS — E785 Hyperlipidemia, unspecified: Secondary | ICD-10-CM | POA: Insufficient documentation

## 2018-05-29 DIAGNOSIS — Z7982 Long term (current) use of aspirin: Secondary | ICD-10-CM | POA: Insufficient documentation

## 2018-05-29 DIAGNOSIS — B351 Tinea unguium: Secondary | ICD-10-CM | POA: Insufficient documentation

## 2018-05-29 DIAGNOSIS — Z7984 Long term (current) use of oral hypoglycemic drugs: Secondary | ICD-10-CM | POA: Insufficient documentation

## 2018-05-29 DIAGNOSIS — I10 Essential (primary) hypertension: Secondary | ICD-10-CM | POA: Diagnosis not present

## 2018-05-29 DIAGNOSIS — Z780 Asymptomatic menopausal state: Secondary | ICD-10-CM | POA: Diagnosis not present

## 2018-05-29 DIAGNOSIS — Z7189 Other specified counseling: Secondary | ICD-10-CM | POA: Diagnosis not present

## 2018-05-29 DIAGNOSIS — Z720 Tobacco use: Secondary | ICD-10-CM

## 2018-05-29 DIAGNOSIS — E1169 Type 2 diabetes mellitus with other specified complication: Secondary | ICD-10-CM | POA: Insufficient documentation

## 2018-05-29 DIAGNOSIS — Z9189 Other specified personal risk factors, not elsewhere classified: Secondary | ICD-10-CM

## 2018-05-29 DIAGNOSIS — Z79899 Other long term (current) drug therapy: Secondary | ICD-10-CM | POA: Insufficient documentation

## 2018-05-29 DIAGNOSIS — Z789 Other specified health status: Secondary | ICD-10-CM

## 2018-05-29 MED ORDER — LISINOPRIL-HYDROCHLOROTHIAZIDE 20-12.5 MG PO TABS
1.0000 | ORAL_TABLET | Freq: Every day | ORAL | 3 refills | Status: DC
Start: 2018-05-29 — End: 2018-07-03

## 2018-05-29 NOTE — Progress Notes (Signed)
Patient ID: Cindy Taylor, female    DOB: [birth year]  MRN: [id number]   Annual Wellness Visit  Cindy Taylor is a 73 y.o. Female who presents for an Annual Wellness Visit. Pt with DM, HTN, HL, Hep C treated with Harvoni ? Patient Active Problem List   Diagnosis Date Noted  . Unintentional weight loss 10/22/2016  . Chews tobacco 01/13/2016  . Onychomycosis of multiple toenails with type 2 diabetes mellitus ([place]) 01/13/2016  . History of hepatitis C 05/23/2014  . DM type 2 (diabetes mellitus, type 2) ([place]) 11/16/2012  . Dyslipidemia 11/16/2012  . Essential hypertension, benign 12/12/2008    Current Outpatient Medications on File Prior to Visit  Medication Sig Dispense Refill  . aspirin 81 MG tablet Take 1 tablet (81 mg total) by mouth daily. 90 tablet 3  . Blood Glucose Monitoring Suppl (ONE TOUCH ULTRA MINI) w/Device KIT 1 each by Other route 2 (two) times daily. ICD 10 E11.9 1 each 0  . Calcium Citrate-Vitamin D 250-200 MG-UNIT TABS Take 1 tablet by mouth 2 (two) times daily with a meal.    . glipiZIDE (GLUCOTROL XL) 2.5 MG 24 hr tablet take 1 tablet by mouth once daily WITH BREAKFAST 30 tablet 6  . glucose blood (ONE TOUCH ULTRA TEST) test strip 1 each by Other route 2 (two) times daily. ICD 10 E11.9 100 each 1  . ibuprofen (ADVIL,MOTRIN) 600 MG tablet Take 1 tablet (600 mg total) by mouth every 6 (six) hours as needed. (Patient not taking: Reported on 12/09/2017) 30 tablet 0  . lisinopril-hydrochlorothiazide (PRINZIDE,ZESTORETIC) 10-12.5 MG tablet Take 1 tablet by mouth daily. 90 tablet 3  . metFORMIN (GLUCOPHAGE) 500 MG tablet Take 1 tablet (500 mg total) by mouth 2 (two) times daily with a meal. 60 tablet 6  . metoprolol succinate (TOPROL-XL) 50 MG 24 hr tablet Take 1 tablet (50 mg total) by mouth daily. Take with or immediately following a meal. 90 tablet 3  . ONE TOUCH LANCETS MISC 1 each by Does not apply route 2 (two) times daily. ICD 10 E11.9 200 each 1  .  pravastatin (PRAVACHOL) 20 MG tablet Take 1 tablet (20 mg total) by mouth daily. 90 tablet 3   No current facility-administered medications on file prior to visit.     No Known Allergies   Health Risk Assessment The patient has completed a Health Risk Assessment. This has been reviewed with the patient and will been scanned into the [facility] system as a separate document.  To summarize, during the past 4 weeks, patient rated her health in general as being very good.  In the past 4 weeks she has been bothered by having teeth or denture problems.  She reports that she had partials in the past but they broke several years ago.  She has several teeth in the left upper and lower jaw that are loose or decayed.  She has an appointment with the dentist 06/06/2018. -She reports taking her medications as prescribed. She reports feeling very confident that she can control and manage most of her health problems.   Current Medical Providers and Suppliers The providers who are involved in the care of this patient are listed above. Additional providers and suppliers are listed below: Gastroenterology Dr. Hilarie Fredrickson   Age-appropriate Screening Schedule Refer to the list in the Health Maintenance section for an age appropriated screening completed by this patient. Additional screening recommendations are listed below in the plan section. The patient has been provided with a  written plan.    Health Maintenance Due  Topic Date Due  . FOOT EXAM  10/22/2017    Depression Screen Over the past two weeks have you:     Felt down or depressed? no     Had little interest or pleasure in doing things? no     depression    Functional Ability/Safety Screen 1. Falls Risk: Does the patient need assistance with ambulation? no Does the patient have a history of a fall in the last 90 days? no Is the patient at risk for falls? no Was the patient's timed "Get Up and Go Test" unsteady or longer than 30 seconds? No.  Completed  in 10 seconds.   2. Does the patient need help with: Ron Parker index)         Bathing: no         Dressing : no         Toileting: no         Transferring: no         Continence: no         Feeding: no           3. Does the home have:         Rugs in the hallway: yes         Grab bars in the bathroom: no         Handrails on the stairs:yes on steps leading up to porch         Stairs in home: no         Poor lightning: no           Hearing Evaluation:     Do you have trouble hearing the television when others do not? no     Do you have to strain to hear/understand conversations? no  Advanced Care Planning     Patient has executed an Advance Directive: no     If no, patient was given the opportunity to execute an Advance Directive today? Yes, discussion was had with the patient and patient was provided with a packet with information about advance care planning     This patient has the ability to prepare an Advance Directive: yes     Provider is willing to follow the patient's wishes: yes      Cognitive Assessment: Does the patient have evidence of cognitive impairment? No.  Patient scored 4 out of 5 on the mini cog The patient does not have evidence of a change in mood/affect, appearance, speech, memory or motor skills.   PHYSICAL EXAM: Vitals:   05/29/18 1014  BP: (!) 162/88  Pulse: 70  Resp: 16  Temp: 98.2 F (36.8 C)  TempSrc: Oral  SpO2: 100%  Weight: 149 lb (67.6 kg)  Height: '5\' 3"'  (1.6 m)   Wt Readings from Last 3 Encounters:  05/29/18 149 lb (67.6 kg)  12/09/17 152 lb (68.9 kg)  05/20/17 150 lb (68 kg)   BP 150/90 Body mass index is 26.39 kg/m. General appearance - alert, well appearing, and in no distress Mental status - normal mood, behavior, speech, dress, motor activity, and thought processes Chest - clear to auscultation, no wheezes, rales or rhonchi, symmetric air entry Heart - normal rate, regular rhythm, normal S1, S2, no murmurs, rubs, clicks or  gallops Extremities - peripheral pulses normal, no pedal edema, no clubbing or cyanosis     ASSESSMENT AND PLAN: Patient Self-Management and Personalized Health Advice 1. Encounter for Medicare annual  wellness exam Patient appears to be functioning well in her home and has no home health needs at this time.  2. Advance directive discussed with patient Discussed advanced care planning.  I went over what is an advanced directive and healthcare power of attorney.  Printed information given.   I encouraged her to discuss this with her 2 sons who live with her and consider completing 1 and having it notarized.  Once she has done so I recommend that she bring a copy for our records.  3. Postmenopausal estrogen deficiency - DG Bone Density; Future  4. Essential hypertension Not at goal.  Lisinopril HCTZ was increased to 20/12.5 mg.  DASH diet discussed and encouraged - lisinopril-hydrochlorothiazide (ZESTORETIC) 20-12.5 MG tablet; Take 1 tablet by mouth daily.  Dispense: 90 tablet; Refill: 3  5. Snuff user Discussed health risks associated with tobacco use including snuff.  She would like to quit.  I recommend trying the nicotine gum to decrease craving  6. Need for dental care Encouraged her to keep her dental appointment    During the course of the visit the patient was educated and counseled about appropriate screening and preventive services including: Need for DEXA scan for osteoporosis screening.  She is agreeable to having this done.  Order was placed.  She is up-to-date with age-appropriate vaccines.              ? An after visit summary with all of these plans was given to the patient.

## 2018-05-29 NOTE — Patient Instructions (Addendum)
Your blood pressure is not well controlled.  We have increased the  lisinopril HCTZ.  Continue to limit salt in the foods.  Please consider discussing advanced healthcare planning with your children and review of the packet information that I have given you.  If you do fill out this form please have it notarized, keep a copy for yourself and your family and please give me a copy on the next visit.  Please keep your scheduled appointment with the dentist.

## 2018-06-21 ENCOUNTER — Telehealth: Payer: Self-pay | Admitting: Internal Medicine

## 2018-06-21 DIAGNOSIS — I10 Essential (primary) hypertension: Secondary | ICD-10-CM

## 2018-06-21 DIAGNOSIS — Z794 Long term (current) use of insulin: Principal | ICD-10-CM

## 2018-06-21 DIAGNOSIS — E119 Type 2 diabetes mellitus without complications: Secondary | ICD-10-CM

## 2018-06-21 DIAGNOSIS — E785 Hyperlipidemia, unspecified: Secondary | ICD-10-CM

## 2018-06-21 MED ORDER — GLIPIZIDE ER 2.5 MG PO TB24: ORAL_TABLET | ORAL | 0 refills | Status: DC

## 2018-06-21 MED ORDER — PRAVASTATIN SODIUM 20 MG PO TABS
20.0000 mg | ORAL_TABLET | Freq: Every day | ORAL | 0 refills | Status: DC
Start: 2018-06-21 — End: 2018-07-03

## 2018-06-21 MED ORDER — METOPROLOL SUCCINATE ER 50 MG PO TB24
50.0000 mg | ORAL_TABLET | Freq: Every day | ORAL | 0 refills | Status: DC
Start: 2018-06-21 — End: 2018-07-03

## 2018-06-21 MED ORDER — METFORMIN HCL 500 MG PO TABS: 500.0000 mg | ORAL_TABLET | Freq: Two times a day (BID) | ORAL | 0 refills | Status: DC

## 2018-06-21 NOTE — Telephone Encounter (Signed)
1) Medication(s) Requested (by name): pravastatin (PRAVACHOL) 20 MG tablet metoprolol succinate (TOPROL-XL) 50 MG 24 hr tablet  metFORMIN (GLUCOPHAGE) 500 MG  glipiZIDE (GLUCOTROL XL) 2.5 MG 24 hr tablet   2) Pharmacy of Choice: [facility] [phone] - [place], [place]  3) Special Requests:   Approved medications will be sent to the pharmacy, we will reach out if there is an issue.  Requests made after 3pm may not be addressed until the following business day!  If a patient is unsure of the name of the medication(s) please note and ask patient to call back when they are able to provide all info, do not send to responsible party until all information is available!

## 2018-07-03 ENCOUNTER — Ambulatory Visit: Payer: Medicare Other | Admitting: Internal Medicine

## 2018-07-03 ENCOUNTER — Ambulatory Visit: Payer: Medicare Other | Attending: Internal Medicine | Admitting: Internal Medicine

## 2018-07-03 ENCOUNTER — Other Ambulatory Visit: Payer: Self-pay

## 2018-07-03 ENCOUNTER — Encounter: Payer: Self-pay | Admitting: Internal Medicine

## 2018-07-03 VITALS — BP 154/90 | HR 71 | Temp 97.9°F | Resp 16 | Wt 144.4 lb

## 2018-07-03 DIAGNOSIS — E785 Hyperlipidemia, unspecified: Secondary | ICD-10-CM

## 2018-07-03 DIAGNOSIS — Z794 Long term (current) use of insulin: Secondary | ICD-10-CM

## 2018-07-03 DIAGNOSIS — E119 Type 2 diabetes mellitus without complications: Secondary | ICD-10-CM

## 2018-07-03 DIAGNOSIS — E1169 Type 2 diabetes mellitus with other specified complication: Secondary | ICD-10-CM

## 2018-07-03 DIAGNOSIS — I10 Essential (primary) hypertension: Secondary | ICD-10-CM

## 2018-07-03 DIAGNOSIS — B351 Tinea unguium: Secondary | ICD-10-CM

## 2018-07-03 DIAGNOSIS — L84 Corns and callosities: Secondary | ICD-10-CM

## 2018-07-03 LAB — POCT GLYCOSYLATED HEMOGLOBIN (HGB A1C): HbA1c, POC (controlled diabetic range): 7 % (ref 0.0–7.0)

## 2018-07-03 LAB — GLUCOSE, POCT (MANUAL RESULT ENTRY): POC Glucose: 125 mg/dl — AB (ref 70–99)

## 2018-07-03 MED ORDER — METFORMIN HCL 500 MG PO TABS: 500.0000 mg | ORAL_TABLET | Freq: Two times a day (BID) | ORAL | 3 refills | Status: DC

## 2018-07-03 MED ORDER — ONETOUCH DELICA LANCETS 33G MISC: 12 refills | Status: DC

## 2018-07-03 MED ORDER — LISINOPRIL-HYDROCHLOROTHIAZIDE 20-12.5 MG PO TABS: 1.0000 | ORAL_TABLET | Freq: Every day | ORAL | 3 refills | Status: DC

## 2018-07-03 MED ORDER — GLUCOSE BLOOD VI STRP: ORAL_STRIP | 12 refills | Status: DC

## 2018-07-03 MED ORDER — METOPROLOL SUCCINATE ER 50 MG PO TB24: 50.0000 mg | ORAL_TABLET | Freq: Every day | ORAL | 3 refills | Status: DC

## 2018-07-03 MED ORDER — GLIPIZIDE ER 2.5 MG PO TB24: ORAL_TABLET | ORAL | 3 refills | Status: DC

## 2018-07-03 MED ORDER — PRAVASTATIN SODIUM 20 MG PO TABS: 20.0000 mg | ORAL_TABLET | Freq: Every day | ORAL | 3 refills | Status: DC

## 2018-07-03 MED ORDER — ONETOUCH VERIO W/DEVICE KIT: PACK | 0 refills | Status: DC

## 2018-07-03 NOTE — Progress Notes (Signed)
Patient ID: Cindy Taylor, female    DOB: 1945-09-03  MRN: [id number]  CC: Hypertension and Diabetes   Subjective: Cindy Taylor is a 73 y.o. female who presents for chronic ds managemen Her concerns today include:  Pt with DM, HTN, HL, Hep C treated with Harvoni, snuff user ? Advance Directive:    DIABETES TYPE 2 Last A1C:   Results for orders placed or performed in visit on 07/03/18  POCT glucose (manual entry)  Result Value Ref Range   POC Glucose 125 (A) 70 - 99 mg/dl  POCT glycosylated hemoglobin (Hb A1C)  Result Value Ref Range   Hemoglobin A1C     HbA1c POC (<> result, manual entry)     HbA1c, POC (prediabetic range)     HbA1c, POC (controlled diabetic range) 7.0 0.0 - 7.0 %    Med Adherence:  '[x]'  Yes    '[]'  No Medication side effects:  '[]'  Yes    '[x]'  No Home Monitoring?  '[]'  Yes    '[x]'  No, would like DM testing supplies Home glucose results range: Diet Adherence: '[x]'  Yes    '[]'  No Exercise: '[x]'  Yes    '[]'  No Hypoglycemic episodes?: '[]'  Yes    '[]'  No Numbness of the feet? '[]'  Yes    '[x]'  No Retinopathy hx? 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 '[]'  Yes    '[]'  No Chest Pain?: '[]'  Yes    '[x]'  No Leg swelling?: '[]'  Yes    '[x]'  No Headaches?: '[]'  Yes    '[x]'  No Dizziness? '[]'  Yes    '[x]'  No Comments:   HL:  Compliant with Pravachol Patient Active Problem List   Diagnosis Date Noted  . Unintentional weight loss 10/22/2016  . Chews tobacco 01/13/2016  . Onychomycosis of multiple toenails with type 2 diabetes mellitus ([place]) 01/13/2016  . History of hepatitis C 05/23/2014  .  DM type 2 (diabetes mellitus, type 2) ([place]) 11/16/2012  . Dyslipidemia 11/16/2012  . Essential hypertension, benign 12/12/2008     Current Outpatient Medications on File Prior to Visit  Medication Sig Dispense Refill  . aspirin 81 MG tablet Take 1 tablet (81 mg total) by mouth daily. 90 tablet 3  . Calcium Citrate-Vitamin D 250-200 MG-UNIT TABS Take 1 tablet by mouth 2 (two) times daily with a meal.    . ibuprofen (ADVIL,MOTRIN) 600 MG tablet Take 1 tablet (600 mg total) by mouth every 6 (six) hours as needed. (Patient not taking: Reported on 12/09/2017) 30 tablet 0   No current facility-administered medications on file prior to visit.     No Known Allergies  Social History   Socioeconomic History  . Marital status: Single    Spouse name: Not on file  . Number of children: Not on file  . Years of education: Not on file  . Highest education level: Not on file  Occupational History  . Not on file  Social Needs  . Emergency planning/management officer  strain: Not on file  . Food insecurity:    Worry: Not on file    Inability: Not on file  . Transportation needs:    Medical: Not on file    Non-medical: Not on file  Tobacco Use  . Smoking status: Never Smoker  . Smokeless tobacco: Current User    Types: Snuff  Substance and Sexual Activity  . Alcohol use: No    Comment: glass of wine at special occasion  . Drug use: No  . Sexual activity: Not Currently  Lifestyle  . Physical activity:    Days per week: Not on file    Minutes per session: Not on file  . Stress: Not on file  Relationships  . Social connections:    Talks on phone: Not on file    Gets together: Not on file    Attends religious service: Not on file    Active member of club or organization: Not on file    Attends meetings of clubs or organizations: Not on file    Relationship status: Not on file  . Intimate partner violence:    Fear of current or ex partner: Not on file    Emotionally abused: Not on file    Physically  abused: Not on file    Forced sexual activity: Not on file  Other Topics Concern  . Not on file  Social History Narrative  . Not on file    Family History  Problem Relation Age of Onset  . Stroke Mother   . Rectal cancer Mother   . Colon polyps Neg Hx   . Colon cancer Neg Hx   . Esophageal cancer Neg Hx   . Stomach cancer Neg Hx     Past Surgical History:  Procedure Laterality Date  . TOTAL ABDOMINAL HYSTERECTOMY    . TUBAL LIGATION      ROS: Review of Systems Negative except as stated above  PHYSICAL EXAM: BP (!) 154/90   Pulse 71   Temp 97.9 F (36.6 C) (Oral)   Resp 16   Wt 144 lb 6.4 oz (65.5 kg)   SpO2 99%   BMI 25.58 kg/m   Physical Exam General appearance - alert, well appearing, and in no distress Mental status - normal mood, behavior, speech, dress, motor activity, and thought processes Mouth - mucous membranes moist, pharynx normal without lesions Neck - supple, no significant adenopathy Chest - clear to auscultation, no wheezes, rales or rhonchi, symmetric air entry Heart - normal rate, regular rhythm, normal S1, S2, no murmurs, rubs, clicks or gallops Extremities - peripheral pulses normal, no pedal edema, no clubbing or cyanosis Diabetic Foot Exam - Simple   Simple Foot Form Visual Inspection See comments:  Yes Sensation Testing Intact to touch and monofilament testing bilaterally:  Yes Pulse Check Posterior Tibialis and Dorsalis pulse intact bilaterally:  Yes Comments Patient have calyces on the medial ball of both big toes.  Toenails on both big toes are thick and discolored.     CMP Latest Ref Rng & Units 01/24/2017 10/28/2016 07/29/2015  Glucose 65 - 99 mg/dL 75 135(H) 128(H)  BUN 8 - 27 mg/dL '12 14 10  ' Creatinine 0.57 - 1.00 mg/dL 1.01(H) 0.78 0.75  Sodium 134 - 144 mmol/L 143 142 140  Potassium 3.5 - 5.2 mmol/L 4.0 4.4 3.6  Chloride 96 - 106 mmol/L 103 101 103  CO2 20 - 29 mmol/L '22 23 25  ' Calcium 8.7 - 10.3 mg/dL 9.5 9.4 8.5(L)  Total Protein 6.0 - 8.5 g/dL 7.4 7.0 -  Total Bilirubin 0.0 - 1.2 mg/dL <0.2 0.3 -  Alkaline Phos 39 - 117 IU/L 51 47 -  AST 0 - 40 IU/L 17 18 -  ALT 0 - 32 IU/L 12 14 -   Lipid Panel     Component Value Date/Time   CHOL 158 01/24/2017 1700   TRIG 126 01/24/2017 1700   HDL 55 01/24/2017 1700   CHOLHDL 2.9 01/24/2017 1700   CHOLHDL 2.8 07/29/2015 0918   VLDL 16 07/29/2015 0918   LDLCALC 78 01/24/2017 1700   LDLDIRECT 71 12/03/2009 2014    CBC    Component Value Date/Time   WBC 7.5 01/24/2017 1700   WBC 7.0 12/13/2014 1031   RBC 4.91 01/24/2017 1700   RBC 4.78 12/13/2014 1031   HGB 13.1 01/24/2017 1700   HCT 41.0 01/24/2017 1700   PLT 187 01/24/2017 1700   MCV 84 01/24/2017 1700   MCH 26.7 01/24/2017 1700   MCH 26.6 12/13/2014 1031   MCHC 32.0 01/24/2017 1700   MCHC 32.7 12/13/2014 1031   RDW 15.4 01/24/2017 1700   LYMPHSABS 3.9 07/24/2013 1741   MONOABS 0.6 07/24/2013 1741   EOSABS 0.1 07/24/2013 1741   BASOSABS 0.0 07/24/2013 1741    ASSESSMENT AND PLAN: 1. Type 2 diabetes mellitus without complication, with long-term current use of insulin (HCC) At goal.  Continue healthy eating habits.  Continue to remain active.  Refills given on medications.  Prescription sent to her pharmacy for diabetic testing supplies. - POCT glucose (manual entry) - POCT glycosylated hemoglobin (Hb A1C) - metFORMIN (GLUCOPHAGE) 500 MG tablet; Take 1 tablet (500 mg total) by mouth 2 (two) times daily with a meal.  Dispense: 180 tablet; Refill: 3 - glipiZIDE (GLUCOTROL XL) 2.5 MG 24 hr tablet; take 1 tablet by mouth once daily WITH BREAKFAST  Dispense: 90 tablet; Refill: 3 - OneTouch Delica Lancets 01B MISC; Check blood sugar fasting and before meals and again if pt feels bad (symptoms of hypo).  Dispense: 100 each; Refill: 12 - Blood Glucose Monitoring Suppl (ONETOUCH VERIO) w/Device KIT; Check blood sugars once daily  Dispense: 1 kit; Refill: 0 - glucose blood (ONETOUCH VERIO) test strip;  Check blood sugars once a day  Dispense: 100 each; Refill: 12 - CBC - Comprehensive metabolic panel - Lipid panel  2. Dyslipidemia - pravastatin (PRAVACHOL) 20 MG tablet; Take 1 tablet (20 mg total) by mouth daily.  Dispense: 90 tablet; Refill: 3  4. Essential hypertension Not at goal.  She has been out of metoprolol.  Refill both metoprolol and lisinopril/HCTZ.  Continue low-salt diet. - metoprolol succinate (TOPROL-XL) 50 MG 24 hr tablet; Take 1 tablet (50 mg total) by mouth daily. Take with or immediately following a meal.  Dispense: 90 tablet; Refill: 3 - lisinopril-hydrochlorothiazide (ZESTORETIC) 20-12.5 MG tablet; Take 1 tablet by mouth daily.  Dispense: 90 tablet; Refill: 3  5. Onychomycosis of multiple toenails with type 2 diabetes mellitus ([place]) Not bothersome to patient at this time so we will not treat.  6. Pre-ulcerative corn or callous Advised patient that we can refer to a podiatrist to have them shaved down.  However patient declined stating that she uses something to shave them down herself.  Good diabetic foot care discussed and encouraged.    Patient was given the opportunity to ask questions.  Patient verbalized understanding of the plan and was able to repeat key elements of the plan.   Orders Placed This  Encounter  Procedures  . CBC  . Comprehensive metabolic panel  . Lipid panel  . POCT glucose (manual entry)  . POCT glycosylated hemoglobin (Hb A1C)     Requested Prescriptions   Signed Prescriptions Disp Refills  . metoprolol succinate (TOPROL-XL) 50 MG 24 hr tablet 90 tablet 3    Sig: Take 1 tablet (50 mg total) by mouth daily. Take with or immediately following a meal.  . metFORMIN (GLUCOPHAGE) 500 MG tablet 180 tablet 3    Sig: Take 1 tablet (500 mg total) by mouth 2 (two) times daily with a meal.  . pravastatin (PRAVACHOL) 20 MG tablet 90 tablet 3    Sig: Take 1 tablet (20 mg total) by mouth daily.  Marland Kitchen lisinopril-hydrochlorothiazide (ZESTORETIC)  20-12.5 MG tablet 90 tablet 3    Sig: Take 1 tablet by mouth daily.  Marland Kitchen glipiZIDE (GLUCOTROL XL) 2.5 MG 24 hr tablet 90 tablet 3    Sig: take 1 tablet by mouth once daily WITH BREAKFAST  . OneTouch Delica Lancets 88Q MISC 100 each 12    Sig: Check blood sugar fasting and before meals and again if pt feels bad (symptoms of hypo).  . Blood Glucose Monitoring Suppl (ONETOUCH VERIO) w/Device KIT 1 kit 0    Sig: Check blood sugars once daily  . glucose blood (ONETOUCH VERIO) test strip 100 each 12    Sig: Check blood sugars once a day    Return in about 3 months (around 10/03/2018).  Karle Plumber, MD, FACP

## 2018-07-04 ENCOUNTER — Telehealth: Payer: Self-pay

## 2018-07-04 LAB — LIPID PANEL
Chol/HDL Ratio: 3 ratio (ref 0.0–4.4)
Cholesterol, Total: 168 mg/dL (ref 100–199)
HDL: 56 mg/dL
LDL Calculated: 100 mg/dL — ABNORMAL HIGH (ref 0–99)
Triglycerides: 58 mg/dL (ref 0–149)
VLDL Cholesterol Cal: 12 mg/dL (ref 5–40)

## 2018-07-04 LAB — COMPREHENSIVE METABOLIC PANEL
ALT: 13 IU/L (ref 0–32)
AST: 16 IU/L (ref 0–40)
Albumin/Globulin Ratio: 1.5 (ref 1.2–2.2)
Albumin: 4.4 g/dL (ref 3.8–4.8)
Alkaline Phosphatase: 59 IU/L (ref 39–117)
BUN/Creatinine Ratio: 18 (ref 12–28)
BUN: 14 mg/dL (ref 8–27)
Bilirubin Total: 0.4 mg/dL (ref 0.0–1.2)
CO2: 23 mmol/L (ref 20–29)
Calcium: 9.9 mg/dL (ref 8.7–10.3)
Chloride: 102 mmol/L (ref 96–106)
Creatinine, Ser: 0.76 mg/dL (ref 0.57–1.00)
GFR calc Af Amer: 92 mL/min/{1.73_m2} (ref >59)
GFR calc non Af Amer: 80 mL/min/{1.73_m2} (ref >59)
Globulin, Total: 3 g/dL (ref 1.5–4.5)
Glucose: 133 mg/dL — ABNORMAL HIGH (ref 65–99)
Potassium: 4.4 mmol/L (ref 3.5–5.2)
Sodium: 141 mmol/L (ref 134–144)
Total Protein: 7.4 g/dL (ref 6.0–8.5)

## 2018-07-04 LAB — CBC
Hematocrit: 41.5 % (ref 34.0–46.6)
Hemoglobin: 13.4 g/dL (ref 11.1–15.9)
MCH: 26.5 pg — ABNORMAL LOW (ref 26.6–33.0)
MCHC: 32.3 g/dL (ref 31.5–35.7)
MCV: 82 fL (ref 79–97)
PLATELETS: 184 10*3/uL (ref 150–450)
RBC: 5.06 x10E6/uL (ref 3.77–5.28)
RDW: 14 % (ref 11.7–15.4)
WBC: 6.6 10*3/uL (ref 3.4–10.8)

## 2018-07-04 NOTE — Telephone Encounter (Signed)
Contacted pt to go over lab results pt didn't answer lvm asking pt to give me a call at her earliest convenience  

## 2018-07-27 ENCOUNTER — Ambulatory Visit (HOSPITAL_COMMUNITY)
Admission: EM | Admit: 2018-07-27 | Discharge: 2018-07-27 | Disposition: A | Discharge status: Home / Self Care | Payer: Medicare Other | Attending: Family Medicine | Admitting: Family Medicine

## 2018-07-27 ENCOUNTER — Other Ambulatory Visit: Payer: Self-pay

## 2018-07-27 ENCOUNTER — Encounter (HOSPITAL_COMMUNITY): Payer: Self-pay | Admitting: Emergency Medicine

## 2018-07-27 DIAGNOSIS — B9789 Other viral agents as the cause of diseases classified elsewhere: Secondary | ICD-10-CM

## 2018-07-27 DIAGNOSIS — J069 Acute upper respiratory infection, unspecified: Secondary | ICD-10-CM

## 2018-07-27 NOTE — ED Provider Notes (Signed)
[name]    CSN: [id number] Arrival date & time: 07/27/18  1055     History   Chief Complaint Chief Complaint  Patient presents with  . Cough    HPI Cindy Taylor is a 73 y.o. female.   HPI  Patient is here for cough.  She has been coughing since yesterday.  She also has some runny nose, postnasal drip, and itching of her eyes.  She does not usually have environmental allergies.  She feels like she has a "cold".  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  She has not had a fever.  She was sent home from working because they heard her coughing today.  She called her primary care doctor but cannot be seen until Monday.  She is here today wondering when she can go back to work, and whether she might have a viral infection such as COVID-19. That she knows of, and there is no COVID-19 in her nursing home. She states that she has well-controlled diabetes and hypertension and is good about her medications.  Past Medical History:  Diagnosis Date  . Arthritis   . Diabetes mellitus    mellitus? x 9 years  . Hepatitis C   . HTN (hypertension)    x8 years  . Hyperlipidemia     Patient Active Problem List   Diagnosis Date Noted  . Unintentional weight loss 10/22/2016  . Chews tobacco 01/13/2016  . Onychomycosis of multiple toenails with type 2 diabetes mellitus ([place]) 01/13/2016  . History of hepatitis C 05/23/2014  . DM type 2 (diabetes mellitus, type 2) ([place]) 11/16/2012  . Dyslipidemia 11/16/2012  . Essential hypertension, benign 12/12/2008    Past Surgical History:  Procedure Laterality Date  . TOTAL ABDOMINAL HYSTERECTOMY    . TUBAL LIGATION      OB History   No obstetric history on file.      Home Medications    Prior to Admission medications   Medication Sig Start Date End Date Taking? Authorizing Provider  aspirin 81 MG tablet Take 1 tablet (81 mg total) by mouth daily. 10/22/16  Yes Funches,  Adriana Mccallum, MD  Calcium Citrate-Vitamin D 250-200 MG-UNIT TABS Take 1 tablet by mouth 2 (two) times daily with a meal.   Yes [provider]  glipiZIDE (GLUCOTROL XL) 2.5 MG 24 hr tablet take 1 tablet by mouth once daily WITH BREAKFAST 07/03/18  Yes Ladell Pier, MD  lisinopril-hydrochlorothiazide (ZESTORETIC) 20-12.5 MG tablet Take 1 tablet by mouth daily. 07/03/18  Yes Ladell Pier, MD  metFORMIN (GLUCOPHAGE) 500 MG tablet Take 1 tablet (500 mg total) by mouth 2 (two) times daily with a meal. 07/03/18  Yes Ladell Pier, MD  metoprolol succinate (TOPROL-XL) 50 MG 24 hr tablet Take 1 tablet (50 mg total) by mouth daily. Take with or immediately following a meal. 07/03/18  Yes Ladell Pier, MD  pravastatin (PRAVACHOL) 20 MG tablet Take 1 tablet (20 mg total) by mouth daily. 07/03/18  Yes Ladell Pier, MD  Blood Glucose Monitoring Suppl [facility] VERIO) w/Device KIT Check blood sugars once daily 07/03/18   Ladell Pier, MD  glucose blood [facility] VERIO) test strip Check blood sugars once a day 07/03/18   Ladell Pier, MD  ibuprofen (ADVIL,MOTRIN) 600 MG tablet Take 1 tablet (600 mg total) by mouth every 6 (six) hours as needed. 07/08/17   Melynda Ripple, MD  OneTouch Delica Lancets  33G MISC Check blood sugar fasting and before meals and again if pt feels bad (symptoms of hypo). 07/03/18   Ladell Pier, MD    Family History Family History  Problem Relation Age of Onset  . Stroke Mother   . Rectal cancer Mother   . Colon polyps Neg Hx   . Colon cancer Neg Hx   . Esophageal cancer Neg Hx   . Stomach cancer Neg Hx     Social History Social History   Tobacco Use  . Smoking status: Never Smoker  . Smokeless tobacco: Current User    Types: Snuff  Substance Use Topics  . Alcohol use: No    Comment: glass of wine at special occasion  . Drug use: No     Allergies   Patient has no known allergies.   Review of Systems Review of Systems   Constitutional: Negative for chills and fever.  HENT: Positive for postnasal drip and rhinorrhea. Negative for ear pain and sore throat.   Eyes: Negative for pain and visual disturbance.  Respiratory: Positive for cough. Negative for shortness of breath.   Cardiovascular: Negative for chest pain and palpitations.  Gastrointestinal: Negative for abdominal pain and vomiting.  Genitourinary: Negative for dysuria and hematuria.  Musculoskeletal: Negative for arthralgias and back pain.  Skin: Negative for color change and rash.  Neurological: Negative for seizures and syncope.  All other systems reviewed and are negative.    Physical Exam Triage Vital Signs ED Triage Vitals  Enc Vitals Group     BP 07/27/18 1125 (!) 145/75     Pulse Rate 07/27/18 1125 78     Resp 07/27/18 1125 16     Temp 07/27/18 1125 98.8 F (37.1 C)     Temp Source 07/27/18 1125 Oral     SpO2 07/27/18 1125 100 %     Weight --      Height --      Head Circumference --      Peak Flow --      Pain Score 07/27/18 1123 2     Pain Loc --      Pain Edu? --      Excl. in [place]? --    No data found.  Updated Vital Signs BP (!) 145/75   Pulse 78   Temp 98.8 F (37.1 C) (Oral)   Resp 16   SpO2 100%   Visual Acuity Right Eye Distance:   Left Eye Distance:   Bilateral Distance:    Right Eye Near:   Left Eye Near:    Bilateral Near:     Physical Exam Constitutional:      General: She is not in acute distress.    Appearance: Normal appearance. She is well-developed and normal weight.  HENT:     Head: Normocephalic and atraumatic.     Right Ear: Tympanic membrane and ear canal normal.     Left Ear: Tympanic membrane and ear canal normal.     Nose:     Comments: Nasal membranes swollen and pink.  Clear rhinorrhea    Mouth/Throat:     Mouth: Mucous membranes are moist.     Pharynx: No posterior oropharyngeal erythema.  Eyes:     Conjunctiva/sclera: Conjunctivae normal.     Pupils: Pupils are equal,  round, and reactive to light.  Neck:     Musculoskeletal: Normal range of motion.  Cardiovascular:     Rate and Rhythm: Normal rate and regular rhythm.  Heart sounds: Normal heart sounds.  Pulmonary:     Effort: Pulmonary effort is normal. No respiratory distress.     Breath sounds: Normal breath sounds.  Abdominal:     General: There is no distension.     Palpations: Abdomen is soft.  Musculoskeletal: Normal range of motion.  Lymphadenopathy:     Cervical: No cervical adenopathy.  Skin:    General: Skin is warm and dry.  Neurological:     Mental Status: She is alert.  Psychiatric:        Mood and Affect: Mood normal.        Behavior: Behavior normal.      UC Treatments / Results  Labs (all labs ordered are listed, but only abnormal results are displayed) Labs Reviewed - No data to display  EKG None  Radiology No results found.  Procedures Procedures (including critical care time)  Medications Ordered in UC Medications - No data to display  Initial Impression / Assessment and Plan / UC Course  I have reviewed the triage vital signs and the nursing notes.  Pertinent labs & imaging results that were available during my care of the patient were reviewed by me and considered in my medical decision making (see chart for details).     I told patient I believe she has a viral upper respiratory infection similar to the common cold.  Because of the COVID-19 epidemic, however, recommend she stay out of work for a week.  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  She is advised that if she gets worse instead of better at any time she needs to call her PCP or the COVID-19 hotline. Final Clinical Impressions(s) / UC Diagnoses   Final diagnoses:  Viral URI with cough     Discharge Instructions     Need to rest and drink plenty of fluids May use an over-the-counter allergy medicine like Zyrtec or Claritin May use a  cough suppressant with DM such as Robitussin-DM or Mucinex DM See your doctor on Monday Out of work for 1 week   ED Prescriptions    None     Controlled Substance Prescriptions [place] Controlled Substance Registry consulted? Not Applicable   Raylene Everts, MD 07/27/18 1236

## 2018-07-27 NOTE — ED Triage Notes (Signed)
PT has had a cough for 2 days. No fever. PT was told to leave work due to cough. Needs a note to return to work.

## 2018-07-27 NOTE — Discharge Instructions (Addendum)
Need to rest and drink plenty of fluids May use an over-the-counter allergy medicine like Zyrtec or Claritin May use a cough suppressant with DM such as Robitussin-DM or Mucinex DM See your doctor on Monday Out of work for 1 week

## 2018-07-31 ENCOUNTER — Encounter: Payer: Self-pay | Admitting: Primary Care

## 2018-07-31 ENCOUNTER — Encounter: Payer: Self-pay | Admitting: *Deleted

## 2018-07-31 ENCOUNTER — Other Ambulatory Visit: Payer: Self-pay

## 2018-07-31 ENCOUNTER — Ambulatory Visit: Payer: Medicare Other | Attending: Primary Care | Admitting: Primary Care

## 2018-07-31 DIAGNOSIS — R05 Cough: Secondary | ICD-10-CM

## 2018-07-31 DIAGNOSIS — J302 Other seasonal allergic rhinitis: Secondary | ICD-10-CM | POA: Diagnosis not present

## 2018-07-31 DIAGNOSIS — I10 Essential (primary) hypertension: Secondary | ICD-10-CM | POA: Diagnosis not present

## 2018-07-31 DIAGNOSIS — E1100 Type 2 diabetes mellitus with hyperosmolarity without nonketotic hyperglycemic-hyperosmolar coma (NKHHC): Secondary | ICD-10-CM

## 2018-07-31 MED ORDER — FEXOFENADINE HCL 60 MG PO TABS: 60.0000 mg | ORAL_TABLET | Freq: Two times a day (BID) | ORAL | 3 refills | Status: DC

## 2018-07-31 MED ORDER — FLUTICASONE PROPIONATE 50 MCG/ACT NA SUSP: 2.0000 | Freq: Every day | NASAL | 6 refills | Status: DC

## 2018-07-31 MED ORDER — FEXOFENADINE HCL 60 MG PO TABS: 60.0000 mg | ORAL_TABLET | Freq: Two times a day (BID) | ORAL | Status: DC

## 2018-07-31 NOTE — Progress Notes (Signed)
Cough and sinus issue/started last Thursday/went to work and you cough and they sent her back home and went to urgent care.  Urgent care told her to get Claritin and she did not get it   Nasal Drainage

## 2018-07-31 NOTE — Addendum Note (Signed)
Addended by: Emilio Aspen A on: 07/31/2018 02:10 PM   Modules accepted: Orders

## 2018-07-31 NOTE — Progress Notes (Signed)
Acute Office Visit  Subjective:    Patient ID: Cindy Taylor, female    DOB: 03/14/1946, 73 y.o.   MRN: [id number]  Chief Complaint  Patient presents with  . Cough  . Nasal Drainage     Cough  This is a new problem. The current episode started in the past 7 days. The problem has been unchanged. The problem occurs every few hours. The cough is non-productive. Associated symptoms include headaches, nasal congestion, postnasal drip and rhinorrhea. Associated symptoms comments: Watery eyes. The symptoms are aggravated by dust and pollens. Risk factors for lung disease include occupational exposure. She has tried OTC cough suppressant and prescription cough suppressant for the symptoms. Her past medical history is significant for environmental allergies.     Patient is concern continues to cough for 2 weeks, cont to feel fatigue has not ran any fever. Also, sneezing and watery eyes.  Past Medical History:  Diagnosis Date  . Arthritis   . Diabetes mellitus    mellitus? x 9 years  . Hepatitis C   . HTN (hypertension)    x8 years  . Hyperlipidemia     Past Surgical History:  Procedure Laterality Date  . TOTAL ABDOMINAL HYSTERECTOMY    . TUBAL LIGATION      Family History  Problem Relation Age of Onset  . Stroke Mother   . Rectal cancer Mother   . Colon polyps Neg Hx   . Colon cancer Neg Hx   . Esophageal cancer Neg Hx   . Stomach cancer Neg Hx     Social History   Socioeconomic History  . Marital status: Single    Spouse name: Not on file  . Number of children: Not on file  . Years of education: Not on file  . Highest education level: Not on file  Occupational History  . Not on file  Social Needs  . Financial resource strain: Not on file  . Food insecurity:    Worry: Not on file    Inability: Not on file  . Transportation needs:    Medical: Not on file    Non-medical: Not on file  Tobacco Use  . Smoking status: Never Smoker  . Smokeless tobacco:  Current User    Types: Snuff  Substance and Sexual Activity  . Alcohol use: No    Comment: glass of wine at special occasion  . Drug use: No  . Sexual activity: Not Currently  Lifestyle  . Physical activity:    Days per week: Not on file    Minutes per session: Not on file  . Stress: Not on file  Relationships  . Social connections:    Talks on phone: Not on file    Gets together: Not on file    Attends religious service: Not on file    Active member of club or organization: Not on file    Attends meetings of clubs or organizations: Not on file    Relationship status: Not on file  . Intimate partner violence:    Fear of current or ex partner: Not on file    Emotionally abused: Not on file    Physically abused: Not on file    Forced sexual activity: Not on file  Other Topics Concern  . Not on file  Social History Narrative  . Not on file    Outpatient Medications Prior to Visit  Medication Sig Dispense Refill  . aspirin 81 MG tablet Take 1 tablet (81 mg total)  by mouth daily. 90 tablet 3  . Blood Glucose Monitoring Suppl (ONETOUCH VERIO) w/Device KIT Check blood sugars once daily 1 kit 0  . Calcium Citrate-Vitamin D 250-200 MG-UNIT TABS Take 1 tablet by mouth 2 (two) times daily with a meal.    . glipiZIDE (GLUCOTROL XL) 2.5 MG 24 hr tablet take 1 tablet by mouth once daily WITH BREAKFAST 90 tablet 3  . glucose blood (ONETOUCH VERIO) test strip Check blood sugars once a day 100 each 12  . lisinopril-hydrochlorothiazide (ZESTORETIC) 20-12.5 MG tablet Take 1 tablet by mouth daily. 90 tablet 3  . metFORMIN (GLUCOPHAGE) 500 MG tablet Take 1 tablet (500 mg total) by mouth 2 (two) times daily with a meal. 180 tablet 3  . metoprolol succinate (TOPROL-XL) 50 MG 24 hr tablet Take 1 tablet (50 mg total) by mouth daily. Take with or immediately following a meal. 90 tablet 3  . OneTouch Delica Lancets 28M MISC Check blood sugar fasting and before meals and again if pt feels bad (symptoms  of hypo). 100 each 12  . pravastatin (PRAVACHOL) 20 MG tablet Take 1 tablet (20 mg total) by mouth daily. 90 tablet 3  . ibuprofen (ADVIL,MOTRIN) 600 MG tablet Take 1 tablet (600 mg total) by mouth every 6 (six) hours as needed. (Patient not taking: Reported on 07/31/2018) 30 tablet 0   No facility-administered medications prior to visit.     No Known Allergies  Review of Systems  HENT: Positive for postnasal drip and rhinorrhea.   Respiratory: Positive for cough.   Neurological: Positive for headaches.  Endo/Heme/Allergies: Positive for environmental allergies.       Objective:    Physical Exam  There were no vitals taken for this visit. Wt Readings from Last 3 Encounters:  07/03/18 144 lb 6.4 oz (65.5 kg)  05/29/18 149 lb (67.6 kg)  12/09/17 152 lb (68.9 kg)    Health Maintenance Due  Topic Date Due  . FOOT EXAM  10/22/2017    There are no preventive care reminders to display for this patient.   Lab Results  Component Value Date   TSH 3.170 10/28/2016   Lab Results  Component Value Date   WBC 6.6 07/03/2018   HGB 13.4 07/03/2018   HCT 41.5 07/03/2018   MCV 82 07/03/2018   PLT 184 07/03/2018   Lab Results  Component Value Date   NA 141 07/03/2018   K 4.4 07/03/2018   CO2 23 07/03/2018   GLUCOSE 133 (H) 07/03/2018   BUN 14 07/03/2018   CREATININE 0.76 07/03/2018   BILITOT 0.4 07/03/2018   ALKPHOS 59 07/03/2018   AST 16 07/03/2018   ALT 13 07/03/2018   PROT 7.4 07/03/2018   ALBUMIN 4.4 07/03/2018   CALCIUM 9.9 07/03/2018   Lab Results  Component Value Date   CHOL 168 07/03/2018   Lab Results  Component Value Date   HDL 56 07/03/2018   Lab Results  Component Value Date   LDLCALC 100 (H) 07/03/2018   Lab Results  Component Value Date   TRIG 58 07/03/2018   Lab Results  Component Value Date   CHOLHDL 3.0 07/03/2018   Lab Results  Component Value Date   HGBA1C 7.0 07/03/2018       Assessment & Plan:   Problem List Items Addressed  This Visit    Essential hypertension, benign (Chronic)   DM type 2 (diabetes mellitus, type 2) (HCC) (Chronic)    Other Visit Diagnoses    Seasonal allergies  Zykira was seen today for cough and nasal drainage.  Diagnoses and all orders for this visit:  Essential hypertension, benign  Type 2 diabetes mellitus with hyperosmolarity without coma, unspecified whether long term insulin use (HCC)  Seasonal allergies  Cough with Azharia was seen today for cough and nasal drainage.  Diagnoses and all orders for this visit:  Essential hypertension, benign Bp elevated on 07/27/2018 145/75 encourage dietary modification and take Bp meds daily  Type 2 diabetes mellitus with hyperosmolarity without coma, unspecified whether long term insulin use (HCC)  Last A1C 7.0 she remains on oral agents  Seasonal allergies/Cough This is a new problem. The current episode started in the past 7 days. The problem has been unchanged. The problem occurs every few hours. The cough is non-productive. Associated symptoms include headaches, nasal congestion, postnasal drip and rhinorrhea. Associated symptoms comments: Watery eyes. The symptoms are aggravated by dust and pollens. Risk factors for lung disease include occupational exposure. She has tried OTC cough suppressant and prescription cough suppressant for the symptoms. Her past medical history is significant for environmental allergies.    No orders of the defined types were placed in this encounter.    Kerin Perna, NP

## 2018-08-09 ENCOUNTER — Other Ambulatory Visit: Payer: Medicare Other

## 2018-09-01 ENCOUNTER — Other Ambulatory Visit: Payer: Self-pay | Admitting: Internal Medicine

## 2018-09-01 DIAGNOSIS — Z794 Long term (current) use of insulin: Secondary | ICD-10-CM

## 2018-09-01 DIAGNOSIS — E119 Type 2 diabetes mellitus without complications: Secondary | ICD-10-CM

## 2018-10-03 ENCOUNTER — Ambulatory Visit: Payer: Medicare Other | Admitting: Internal Medicine

## 2018-11-10 ENCOUNTER — Other Ambulatory Visit: Payer: Medicare Other

## 2019-01-11 ENCOUNTER — Other Ambulatory Visit: Payer: Self-pay | Admitting: Internal Medicine

## 2019-01-11 DIAGNOSIS — E119 Type 2 diabetes mellitus without complications: Secondary | ICD-10-CM

## 2019-01-16 ENCOUNTER — Ambulatory Visit
Admission: RE | Admit: 2019-01-16 | Discharge: 2019-01-16 | Disposition: A | Discharge status: Home / Self Care | Payer: Medicare Other | Source: Ambulatory Visit | Attending: Internal Medicine | Admitting: Internal Medicine

## 2019-01-16 ENCOUNTER — Other Ambulatory Visit: Payer: Self-pay

## 2019-01-16 DIAGNOSIS — Z780 Asymptomatic menopausal state: Secondary | ICD-10-CM

## 2019-01-23 ENCOUNTER — Telehealth: Payer: Self-pay | Admitting: *Deleted

## 2019-01-23 NOTE — Telephone Encounter (Signed)
Patient verified DOB Patient is aware of bone density showing some thinning but not severe enough to diagnose as osteoporosis. Patient advised to walk several times a day and take vitamin d and calcium supplements. No further questions.

## 2019-01-23 NOTE — Telephone Encounter (Signed)
-----   Message from Ladell Pier, MD sent at 01/17/2019  1:31 PM EDT ----- Let pt know that her bone density study reveals she has some thinning of the bone but not sever enough to call osteoporosis.  Continue Calcium and vitamin d supplement and try to walk several times a week for exercise.

## 2019-02-07 ENCOUNTER — Other Ambulatory Visit: Payer: Self-pay | Admitting: Internal Medicine

## 2019-02-07 DIAGNOSIS — Z1231 Encounter for screening mammogram for malignant neoplasm of breast: Secondary | ICD-10-CM

## 2019-02-15 ENCOUNTER — Encounter: Payer: Self-pay | Admitting: Internal Medicine

## 2019-02-15 ENCOUNTER — Other Ambulatory Visit: Payer: Self-pay

## 2019-02-15 ENCOUNTER — Ambulatory Visit: Payer: Medicare Other | Attending: Internal Medicine | Admitting: Internal Medicine

## 2019-02-15 ENCOUNTER — Ambulatory Visit (HOSPITAL_BASED_OUTPATIENT_CLINIC_OR_DEPARTMENT_OTHER): Payer: Medicare Other | Admitting: Pharmacist

## 2019-02-15 VITALS — BP 124/80 | HR 73 | Temp 98.4°F | Resp 16 | Wt 148.0 lb

## 2019-02-15 DIAGNOSIS — I10 Essential (primary) hypertension: Secondary | ICD-10-CM

## 2019-02-15 DIAGNOSIS — Z794 Long term (current) use of insulin: Secondary | ICD-10-CM

## 2019-02-15 DIAGNOSIS — E785 Hyperlipidemia, unspecified: Secondary | ICD-10-CM

## 2019-02-15 DIAGNOSIS — Z1389 Encounter for screening for other disorder: Secondary | ICD-10-CM

## 2019-02-15 DIAGNOSIS — I8391 Asymptomatic varicose veins of right lower extremity: Secondary | ICD-10-CM

## 2019-02-15 DIAGNOSIS — R079 Chest pain, unspecified: Secondary | ICD-10-CM

## 2019-02-15 DIAGNOSIS — E1169 Type 2 diabetes mellitus with other specified complication: Secondary | ICD-10-CM | POA: Diagnosis not present

## 2019-02-15 DIAGNOSIS — Z23 Encounter for immunization: Secondary | ICD-10-CM

## 2019-02-15 DIAGNOSIS — R0789 Other chest pain: Secondary | ICD-10-CM

## 2019-02-15 DIAGNOSIS — E119 Type 2 diabetes mellitus without complications: Secondary | ICD-10-CM

## 2019-02-15 LAB — POCT URINALYSIS DIP (CLINITEK)
Bilirubin, UA: NEGATIVE
Glucose, UA: >=1000 mg/dL — AB
Ketones, POC UA: NEGATIVE mg/dL
Leukocytes, UA: NEGATIVE
Nitrite, UA: NEGATIVE
POC PROTEIN,UA: NEGATIVE
Spec Grav, UA: 1.02 (ref 1.010–1.025)
Urobilinogen, UA: 0.2 E.U./dL
pH, UA: 5 (ref 5.0–8.0)

## 2019-02-15 LAB — GLUCOSE, POCT (MANUAL RESULT ENTRY): POC Glucose: 357 mg/dl — AB (ref 70–99)

## 2019-02-15 MED ORDER — OMEPRAZOLE 20 MG PO CPDR: 20.0000 mg | DELAYED_RELEASE_CAPSULE | Freq: Every day | ORAL | 3 refills | Status: DC

## 2019-02-15 MED ORDER — GLIPIZIDE ER 5 MG PO TB24: ORAL_TABLET | ORAL | 5 refills | Status: DC

## 2019-02-15 MED ORDER — METFORMIN HCL 500 MG PO TABS: 500.0000 mg | ORAL_TABLET | Freq: Two times a day (BID) | ORAL | 5 refills | Status: DC

## 2019-02-15 NOTE — Patient Instructions (Addendum)
Increase glipizide to 5 mg daily.  Continue metformin.  Continue to monitor your blood sugars.  Try to check them at least once a day before meals with goal being 90-130.  I have referred you to cardiology for evaluation of chest pains.  Start Prilosec as discussed today.  Influenza Virus Vaccine injection (Fluarix) What is this medicine? INFLUENZA VIRUS VACCINE (in floo EN zuh VAHY ruhs vak SEEN) helps to reduce the risk of getting influenza also known as the flu. This medicine may be used for other purposes; ask your health care provider or pharmacist if you have questions. COMMON BRAND NAME(S): Fluarix, Fluzone What should I tell my health care provider before I take this medicine? They need to know if you have any of these conditions:  bleeding disorder like hemophilia  fever or infection  Guillain-Barre syndrome or other neurological problems  immune system problems  infection with the human immunodeficiency virus (HIV) or AIDS  low blood platelet counts  multiple sclerosis  an unusual or allergic reaction to influenza virus vaccine, eggs, chicken proteins, latex, gentamicin, other medicines, foods, dyes or preservatives  pregnant or trying to get pregnant  breast-feeding How should I use this medicine? This vaccine is for injection into a muscle. It is given by a health care professional. A copy of Vaccine Information Statements will be given before each vaccination. Read this sheet carefully each time. The sheet may change frequently. Talk to your pediatrician regarding the use of this medicine in children. Special care may be needed. Overdosage: If you think you have taken too much of this medicine contact a poison control center or emergency room at once. NOTE: This medicine is only for you. Do not share this medicine with others. What if I miss a dose? This does not apply. What may interact with this medicine?  chemotherapy or radiation therapy  medicines that  lower your immune system like etanercept, anakinra, infliximab, and adalimumab  medicines that treat or prevent blood clots like warfarin  phenytoin  steroid medicines like prednisone or cortisone  theophylline  vaccines This list may not describe all possible interactions. Give your health care provider a list of all the medicines, herbs, non-prescription drugs, or dietary supplements you use. Also tell them if you smoke, drink alcohol, or use illegal drugs. Some items may interact with your medicine. What should I watch for while using this medicine? Report any side effects that do not go away within 3 days to your doctor or health care professional. Call your health care provider if any unusual symptoms occur within 6 weeks of receiving this vaccine. You may still catch the flu, but the illness is not usually as bad. You cannot get the flu from the vaccine. The vaccine will not protect against colds or other illnesses that may cause fever. The vaccine is needed every year. What side effects may I notice from receiving this medicine? Side effects that you should report to your doctor or health care professional as soon as possible:  allergic reactions like skin rash, itching or hives, swelling of the face, lips, or tongue Side effects that usually do not require medical attention (report to your doctor or health care professional if they continue or are bothersome):  fever  headache  muscle aches and pains  pain, tenderness, redness, or swelling at site where injected  weak or tired This list may not describe all possible side effects. Call your doctor for medical advice about side effects. You may report side  effects to FDA at 1-800-FDA-1088. Where should I keep my medicine? This vaccine is only given in a clinic, pharmacy, doctor's office, or other health care setting and will not be stored at home. NOTE: This sheet is a summary. It may not cover all possible information. If you  have questions about this medicine, talk to your doctor, pharmacist, or health care provider.  2020 Elsevier/Gold Standard (2007-11-01 09:30:40)

## 2019-02-15 NOTE — Progress Notes (Signed)
Patient presents for vaccination against influenza per orders of Dr. Johnson. Consent given. Counseling provided. No contraindications exists. Vaccine administered without incident.   

## 2019-02-15 NOTE — Progress Notes (Signed)
Patient ID: Cindy Taylor, female    DOB: 03-19-1946  MRN: [id number]  CC: Diabetes and Hypertension   Subjective: Cindy Taylor is a 73 y.o. female who presents for chronic ds management Her concerns today include:  Pt with DM, HTN, HL, Hep C treated with Harvoni, snuff user  DIABETES TYPE 2 Last A1C:   Results for orders placed or performed in visit on 02/15/19  POCT glucose (manual entry)  Result Value Ref Range   POC Glucose 357 (A) 70 - 99 mg/dl  POCT URINALYSIS DIP (CLINITEK)  Result Value Ref Range   Color, UA yellow yellow   Clarity, UA clear clear   Glucose, UA >=1,000 (A) negative mg/dL   Bilirubin, UA negative negative   Ketones, POC UA negative negative mg/dL   Spec Grav, UA 1.020 1.010 - 1.025   Blood, UA small (A) negative   pH, UA 5.0 5.0 - 8.0   POC PROTEIN,UA negative negative, trace   Urobilinogen, UA 0.2 0.2 or 1.0 E.U./dL   Nitrite, UA Negative Negative   Leukocytes, UA Negative Negative    Med Adherence:  '[x]'  Yes -out of Metformin x 3 days because of limited finances.  Working part-time and has to pay her other bills first.  Taking glipizide Medication side effects:  '[]'  Yes    '[x]'  No Home Monitoring?  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 '[]'  Yes    '[x]'  No Retinopathy hx? '[]'  Yes    '[x]'  No Last eye exam: no blurred vision.  Has eye exam next mth Comments:   HYPERTENSION Currently taking: see medication list Med Adherence: '[x]'  Yes.  On metoprolol and lisinopril/HCTZ   '[]'  No Medication side effects: '[]'  Yes    '[x]'  No Adherence with salt restriction: '[x]'  Yes    '[]'  No Home Monitoring?: '[]'  Yes    '[]'  No Monitoring Frequency: '[]'  Yes    '[]'  No Home BP results range: '[]'  Yes    '[]'  No SOB? '[]'  Yes    '[x]'  No Chest Pain?: '[]'  Yes     '[x]'  No Leg swelling?: '[]'  Yes    '[x]'  No Headaches?: '[]'  Yes    '[x]'  No Dizziness? '[]'  Yes    '[x]'  No Comments:   C/o of feeling of indigestion in center of chest.  Occurred 2 mths ago intermittently then went away.  Came back 3 days ago.  Feels like something sitting in the center of chest.  No radiation.  Usually goes away if she sits down or lays down.  She has not associated it with any particular type of foods.  Takes Tums which helps.   Not on any NSAIDs.   -chews snuff -no fhx of HD  HL:  compliant with Pravachol and ASA  Patient Active Problem List   Diagnosis Date Noted   Unintentional weight loss 10/22/2016   Chews tobacco 01/13/2016   Onychomycosis of multiple toenails with type 2 diabetes mellitus ([place]) 01/13/2016   History of hepatitis C 05/23/2014   DM type 2 (diabetes mellitus, type 2) ([place]) 11/16/2012   Dyslipidemia 11/16/2012   Essential hypertension, benign 12/12/2008     Current Outpatient Medications on File Prior to Visit  Medication Sig Dispense Refill  aspirin 81 MG tablet Take 1 tablet (81 mg total) by mouth daily. 90 tablet 3   Blood Glucose Monitoring Suppl (ONETOUCH VERIO) w/Device KIT Check blood sugars once daily 1 kit 0   Calcium Citrate-Vitamin D 250-200 MG-UNIT TABS Take 1 tablet by mouth 2 (two) times daily with a meal.     fexofenadine (ALLEGRA ALLERGY) 60 MG tablet Take 1 tablet (60 mg total) by mouth 2 (two) times daily. 60 tablet 3   fluticasone (FLONASE) 50 MCG/ACT nasal spray Place 2 sprays into both nostrils daily. 16 g 6   glucose blood (ONETOUCH VERIO) test strip Check blood sugars once a day 100 each 12   lisinopril-hydrochlorothiazide (ZESTORETIC) 20-12.5 MG tablet Take 1 tablet by mouth daily. 90 tablet 3   metoprolol succinate (TOPROL-XL) 50 MG 24 hr tablet Take 1 tablet (50 mg total) by mouth daily. Take with or immediately following a meal. 90 tablet 3   OneTouch Delica Lancets 25Z MISC Check blood sugar fasting and before  meals and again if pt feels bad (symptoms of hypo). 100 each 12   pravastatin (PRAVACHOL) 20 MG tablet Take 1 tablet (20 mg total) by mouth daily. 90 tablet 3   No current facility-administered medications on file prior to visit.     No Known Allergies  Social History   Socioeconomic History   Marital status: Single    Spouse name: Not on file   Number of children: Not on file   Years of education: Not on file   Highest education level: Not on file  Occupational History   Not on file  Social Needs   Financial resource strain: Not on file   Food insecurity    Worry: Not on file    Inability: Not on file   Transportation needs    Medical: Not on file    Non-medical: Not on file  Tobacco Use   Smoking status: Never Smoker   Smokeless tobacco: Current User    Types: Snuff  Substance and Sexual Activity   Alcohol use: No    Comment: glass of wine at special occasion   Drug use: No   Sexual activity: Not Currently  Lifestyle   Physical activity    Days per week: Not on file    Minutes per session: Not on file   Stress: Not on file  Relationships   Social connections    Talks on phone: Not on file    Gets together: Not on file    Attends religious service: Not on file    Active member of club or organization: Not on file    Attends meetings of clubs or organizations: Not on file    Relationship status: Not on file   Intimate partner violence    Fear of current or ex partner: Not on file    Emotionally abused: Not on file    Physically abused: Not on file    Forced sexual activity: Not on file  Other Topics Concern   Not on file  Social History Narrative   Not on file    Family History  Problem Relation Age of Onset   Stroke Mother    Rectal cancer Mother    Colon polyps Neg Hx    Colon cancer Neg Hx    Esophageal cancer Neg Hx    Stomach cancer Neg Hx     Past Surgical History:  Procedure Laterality Date   TOTAL ABDOMINAL  HYSTERECTOMY     TUBAL LIGATION  ROS: Review of Systems Negative except as stated above  PHYSICAL EXAM: BP 124/80    Pulse 73    Temp 98.4 F (36.9 C) (Oral)    Resp 16    Wt 148 lb (67.1 kg)    SpO2 95%    BMI 26.22 kg/m   Wt Readings from Last 3 Encounters:  02/15/19 148 lb (67.1 kg)  07/03/18 144 lb 6.4 oz (65.5 kg)  05/29/18 149 lb (67.6 kg)    Physical Exam  General appearance - alert, well appearing, and in no distress Mental status - normal mood, behavior, speech, dress, motor activity, and thought processes Mouth - mucous membranes moist, pharynx normal without lesions Neck - supple, no significant adenopathy Chest - clear to auscultation, no wheezes, rales or rhonchi, symmetric air entry Heart - normal rate, regular rhythm, normal S1, S2, no murmurs, rubs, clicks or gallops Abdomen - soft, nontender, nondistended, no masses or organomegaly Extremities - peripheral pulses normal, no pedal edema, no clubbing or cyanosis.  Small amount of varicose vein on the lateral aspect of the right leg below the knee.  Patient was concerned about those. Diabetic Foot Exam - Simple   Simple Foot Form Visual Inspection No deformities, no ulcerations, no other skin breakdown bilaterally: Yes Sensation Testing Intact to touch and monofilament testing bilaterally: Yes Pulse Check Posterior Tibialis and Dorsalis pulse intact bilaterally: Yes Comments      CMP Latest Ref Rng & Units 07/03/2018 01/24/2017 10/28/2016  Glucose 65 - 99 mg/dL 133(H) 75 135(H)  BUN 8 - 27 mg/dL '14 12 14  ' Creatinine 0.57 - 1.00 mg/dL 0.76 1.01(H) 0.78  Sodium 134 - 144 mmol/L 141 143 142  Potassium 3.5 - 5.2 mmol/L 4.4 4.0 4.4  Chloride 96 - 106 mmol/L 102 103 101  CO2 20 - 29 mmol/L '23 22 23  ' Calcium 8.7 - 10.3 mg/dL 9.9 9.5 9.4  Total Protein 6.0 - 8.5 g/dL 7.4 7.4 7.0  Total Bilirubin 0.0 - 1.2 mg/dL 0.4 <0.2 0.3  Alkaline Phos 39 - 117 IU/L 59 51 47  AST 0 - 40 IU/L '16 17 18  ' ALT 0 - 32 IU/L  '13 12 14   ' Lipid Panel     Component Value Date/Time   CHOL 168 07/03/2018 1311   TRIG 58 07/03/2018 1311   HDL 56 07/03/2018 1311   CHOLHDL 3.0 07/03/2018 1311   CHOLHDL 2.8 07/29/2015 0918   VLDL 16 07/29/2015 0918   LDLCALC 100 (H) 07/03/2018 1311   LDLDIRECT 71 12/03/2009 2014    CBC    Component Value Date/Time   WBC 6.6 07/03/2018 1311   WBC 7.0 12/13/2014 1031   RBC 5.06 07/03/2018 1311   RBC 4.78 12/13/2014 1031   HGB 13.4 07/03/2018 1311   HCT 41.5 07/03/2018 1311   PLT 184 07/03/2018 1311   MCV 82 07/03/2018 1311   MCH 26.5 (L) 07/03/2018 1311   MCH 26.6 12/13/2014 1031   MCHC 32.3 07/03/2018 1311   MCHC 32.7 12/13/2014 1031   RDW 14.0 07/03/2018 1311   LYMPHSABS 3.9 07/24/2013 1741   MONOABS 0.6 07/24/2013 1741   EOSABS 0.1 07/24/2013 1741   BASOSABS 0.0 07/24/2013 1741   EKG: Normal sinus rhythm with left atrial enlargement.  No acute ischemic changes and unchanged from EKG done 10/2008 Results for orders placed or performed in visit on 02/15/19  POCT glucose (manual entry)  Result Value Ref Range   POC Glucose 357 (A) 70 - 99 mg/dl  POCT  URINALYSIS DIP (CLINITEK)  Result Value Ref Range   Color, UA yellow yellow   Clarity, UA clear clear   Glucose, UA >=1,000 (A) negative mg/dL   Bilirubin, UA negative negative   Ketones, POC UA negative negative mg/dL   Spec Grav, UA 1.020 1.010 - 1.025   Blood, UA small (A) negative   pH, UA 5.0 5.0 - 8.0   POC PROTEIN,UA negative negative, trace   Urobilinogen, UA 0.2 0.2 or 1.0 E.U./dL   Nitrite, UA Negative Negative   Leukocytes, UA Negative Negative    ASSESSMENT AND PLAN: 1. Type 2 diabetes mellitus without complication, without long-term current use of insulin (HCC) Blood sugars not at goal.  Continue Metformin.  Increase glipizide from 2.5 mg daily now to 5 mg daily.  Continue healthy eating habits.  Keep appointment for eye exam next month - POCT glucose (manual entry) - Microalbumin / creatinine  urine ratio - POCT URINALYSIS DIP (CLINITEK) - Hemoglobin A1c - glipiZIDE (GLUCOTROL XL) 5 MG 24 hr tablet; TAKE 1 TABLET BY MOUTH EVERY DAY WITH BREAKFAST. Must have office visit for refills  Dispense: 30 tablet; Refill: 5 - metFORMIN (GLUCOPHAGE) 500 MG tablet; Take 1 tablet (500 mg total) by mouth 2 (two) times daily with a meal.  Dispense: 60 tablet; Refill: 5  2. Essential hypertension At goal.  Continue current medications  3. Hyperlipidemia associated with type 2 diabetes mellitus ([place]) Continue statin  4. Chest pain in adult History is concerning.  Patient thinks it may be reflux but I think we need to rule out heart disease.  She does have risk factors. I have started on Prilosec.  GERD precautions given - EKG 12-Lead - Ambulatory referral to Cardiology  5. Need for influenza vaccination Given  6. Asymptomatic varicose veins of right lower extremity Observe     Patient was given the opportunity to ask questions.  Patient verbalized understanding of the plan and was able to repeat key elements of the plan.   Orders Placed This Encounter  Procedures   Microalbumin / creatinine urine ratio   Hemoglobin A1c   Ambulatory referral to Cardiology   POCT glucose (manual entry)   POCT URINALYSIS DIP (CLINITEK)   EKG 12-Lead     Requested Prescriptions   Signed Prescriptions Disp Refills   glipiZIDE (GLUCOTROL XL) 5 MG 24 hr tablet 30 tablet 5    Sig: TAKE 1 TABLET BY MOUTH EVERY DAY WITH BREAKFAST. Must have office visit for refills   metFORMIN (GLUCOPHAGE) 500 MG tablet 60 tablet 5    Sig: Take 1 tablet (500 mg total) by mouth 2 (two) times daily with a meal.   omeprazole (PRILOSEC) 20 MG capsule 30 capsule 3    Sig: Take 1 capsule (20 mg total) by mouth daily.    Return in about 4 months (around 06/17/2019).  Karle Plumber, MD, FACP

## 2019-02-16 LAB — HEMOGLOBIN A1C
Est. average glucose Bld gHb Est-mCnc: 269 mg/dL
Hgb A1c MFr Bld: 11 % — ABNORMAL HIGH (ref 4.8–5.6)

## 2019-02-16 LAB — MICROALBUMIN / CREATININE URINE RATIO
Creatinine, Urine: 91.6 mg/dL
Microalb/Creat Ratio: 10 mg/g creat (ref 0–29)
Microalbumin, Urine: 9 ug/mL

## 2019-02-20 ENCOUNTER — Telehealth: Payer: Self-pay

## 2019-02-20 NOTE — Telephone Encounter (Signed)
Contacted pt to go over lab results pt didn't answer lvm asking pt to give me a call at her earliest convenience  

## 2019-02-21 ENCOUNTER — Ambulatory Visit: Payer: Medicare Other | Admitting: Cardiology

## 2019-02-22 ENCOUNTER — Ambulatory Visit: Payer: Medicare Other | Admitting: Cardiology

## 2019-02-22 ENCOUNTER — Telehealth: Payer: Self-pay | Admitting: Internal Medicine

## 2019-02-22 LAB — HM DIABETES EYE EXAM

## 2019-02-22 NOTE — Telephone Encounter (Signed)
Patient returned missed call in regards to her results. Please follow up.

## 2019-02-23 NOTE — Telephone Encounter (Signed)
Returned pt call to go over lab results pt didn't answer lvm asking pt to give me a call back at her earliest convenience

## 2019-03-07 ENCOUNTER — Telehealth: Payer: Self-pay | Admitting: Cardiology

## 2019-03-07 NOTE — Telephone Encounter (Signed)
Patient has an appt with Dr. 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 She states they get tested every Tuesday and so far she has tested negative.

## 2019-03-07 NOTE — Telephone Encounter (Signed)
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

## 2019-03-08 ENCOUNTER — Ambulatory Visit: Payer: Medicare Other | Admitting: Cardiology

## 2019-04-03 ENCOUNTER — Ambulatory Visit
Admission: RE | Admit: 2019-04-03 | Discharge: 2019-04-03 | Disposition: A | Discharge status: Home / Self Care | Payer: Medicare Other | Source: Ambulatory Visit | Attending: Internal Medicine | Admitting: Internal Medicine

## 2019-04-03 ENCOUNTER — Other Ambulatory Visit: Payer: Self-pay

## 2019-04-03 DIAGNOSIS — Z1231 Encounter for screening mammogram for malignant neoplasm of breast: Secondary | ICD-10-CM

## 2019-04-17 ENCOUNTER — Telehealth: Payer: Self-pay | Admitting: Internal Medicine

## 2019-04-17 NOTE — Telephone Encounter (Signed)
Patient called asking should she take the covid 19 vaccine

## 2019-04-18 NOTE — Telephone Encounter (Signed)
Recommendations should come from the PCP. From a pharmacist's perspective, I would recommend vaccination to all who can receive the vaccine. With her history of T2DM, HTN, Hep C, and HLD, she would be at risk for covid-related complications.

## 2019-04-19 NOTE — Telephone Encounter (Signed)
Returned pt call and went over Dr. Johnson response pt is aware and doesn't have any questions or concerns  

## 2019-04-30 NOTE — Progress Notes (Deleted)
Cardiology Office Note:    Date:  04/30/2019   ID:  Cindy Taylor, DOB 1945-12-11, MRN [id number]  PCP:  Ladell Pier, MD  Cardiologist:  No primary care provider on file.  Electrophysiologist:  None   Referring MD: Ladell Pier, MD   No chief complaint on file. ***  History of Present Illness:    Cindy Taylor is a 74 y.o. female with a hx of type 2 diabetes, hypertension, hyperlipidemia, hepatitis C who is referred by Dr. Wynetta Emery for evaluation of chest pain.  Past Medical History:  Diagnosis Date  . Arthritis   . Diabetes mellitus    mellitus? x 9 years  . Hepatitis C   . HTN (hypertension)    x8 years  . Hyperlipidemia     Past Surgical History:  Procedure Laterality Date  . TOTAL ABDOMINAL HYSTERECTOMY    . TUBAL LIGATION      Current Medications: No outpatient medications have been marked as taking for the 05/01/19 encounter (Appointment) with Donato Heinz, MD.     Allergies:   Patient has no known allergies.   Social History   Socioeconomic History  . Marital status: Single    Spouse name: Not on file  . Number of children: Not on file  . Years of education: Not on file  . Highest education level: Not on file  Occupational History  . Not on file  Tobacco Use  . Smoking status: Never Smoker  . Smokeless tobacco: Current User    Types: Snuff  Substance and Sexual Activity  . Alcohol use: No    Comment: glass of wine at special occasion  . Drug use: No  . Sexual activity: Not Currently  Other Topics Concern  . Not on file  Social History Narrative  . Not on file   Social Determinants of Health   Financial Resource Strain:   . Difficulty of Paying Living Expenses: Not on file  Food Insecurity:   . Worried About Charity fundraiser in the Last Year: Not on file  . Ran Out of Food in the Last Year: Not on file  Transportation Needs:   . Lack of Transportation (Medical): Not on file  . Lack of  Transportation (Non-Medical): Not on file  Physical Activity:   . Days of Exercise per Week: Not on file  . Minutes of Exercise per Session: Not on file  Stress:   . Feeling of Stress : Not on file  Social Connections:   . Frequency of Communication with Friends and Family: Not on file  . Frequency of Social Gatherings with Friends and Family: Not on file  . Attends Religious Services: Not on file  . Active Member of Clubs or Organizations: Not on file  . Attends Archivist Meetings: Not on file  . Marital Status: Not on file     Family History: The patient's ***family history includes Rectal cancer in her mother; Stroke in her mother. There is no history of Colon polyps, Colon cancer, Esophageal cancer, or Stomach cancer.  ROS:   Please see the history of present illness.    *** All other systems reviewed and are negative.  EKGs/Labs/Other Studies Reviewed:    The following studies were reviewed today: ***  EKG:  EKG is *** ordered today.  The ekg ordered today demonstrates ***  Recent Labs: 07/03/2018: ALT 13; BUN 14; Creatinine, Ser 0.76; Hemoglobin 13.4; Platelets 184; Potassium 4.4; Sodium 141  Recent Lipid Panel  Component Value Date/Time   CHOL 168 07/03/2018 1311   TRIG 58 07/03/2018 1311   HDL 56 07/03/2018 1311   CHOLHDL 3.0 07/03/2018 1311   CHOLHDL 2.8 07/29/2015 0918   VLDL 16 07/29/2015 0918   LDLCALC 100 (H) 07/03/2018 1311   LDLDIRECT 71 12/03/2009 2014    Physical Exam:    VS:  There were no vitals taken for this visit.    Wt Readings from Last 3 Encounters:  02/15/19 148 lb (67.1 kg)  07/03/18 144 lb 6.4 oz (65.5 kg)  05/29/18 149 lb (67.6 kg)     GEN: *** Well nourished, well developed in no acute distress HEENT: Normal NECK: No JVD; No carotid bruits LYMPHATICS: No lymphadenopathy CARDIAC: ***RRR, no murmurs, rubs, gallops RESPIRATORY:  Clear to auscultation without rales, wheezing or rhonchi  ABDOMEN: Soft, non-tender,  non-distended MUSCULOSKELETAL:  No edema; No deformity  SKIN: Warm and dry NEUROLOGIC:  Alert and oriented x 3 PSYCHIATRIC:  Normal affect   ASSESSMENT:    No diagnosis found. PLAN:    In order of problems listed above:  Chest pain:  Hypertension: On Toprol-XL 50 mg daily, lisinopril-hydrochlorothiazide 20-12.5 mg  Hyperlipidemia: On pravastatin 20 mg, LDL 100 on 07/03/18.  Given diabetes, will switch to high intensity statin.  Type 2 diabetes: On Metformin and glipizide.  A1c 11 on 02/15/2019    Medication Adjustments/Labs and Tests Ordered: Current medicines are reviewed at length with the patient today.  Concerns regarding medicines are outlined above.  No orders of the defined types were placed in this encounter.  No orders of the defined types were placed in this encounter.   There are no Patient Instructions on file for this visit.   Signed, Donato Heinz, MD  04/30/2019 10:22 PM    [facility]

## 2019-05-01 ENCOUNTER — Ambulatory Visit: Payer: Medicare Other | Admitting: Cardiology

## 2019-06-29 ENCOUNTER — Other Ambulatory Visit: Payer: Self-pay | Admitting: Internal Medicine

## 2019-06-29 DIAGNOSIS — I10 Essential (primary) hypertension: Secondary | ICD-10-CM

## 2019-07-26 ENCOUNTER — Other Ambulatory Visit: Payer: Self-pay | Admitting: Internal Medicine

## 2019-07-26 ENCOUNTER — Ambulatory Visit: Payer: Medicare Other | Admitting: Internal Medicine

## 2019-07-26 DIAGNOSIS — E119 Type 2 diabetes mellitus without complications: Secondary | ICD-10-CM

## 2019-07-26 DIAGNOSIS — Z794 Long term (current) use of insulin: Secondary | ICD-10-CM

## 2019-08-17 ENCOUNTER — Ambulatory Visit: Payer: Medicare Other | Admitting: Internal Medicine

## 2019-08-29 ENCOUNTER — Other Ambulatory Visit: Payer: Self-pay

## 2019-08-29 ENCOUNTER — Ambulatory Visit (HOSPITAL_COMMUNITY)
Admission: EM | Admit: 2019-08-29 | Discharge: 2019-08-29 | Disposition: A | Discharge status: Home / Self Care | Payer: Medicare Other | Attending: Emergency Medicine | Admitting: Emergency Medicine

## 2019-08-29 ENCOUNTER — Telehealth: Payer: Self-pay | Admitting: Internal Medicine

## 2019-08-29 ENCOUNTER — Encounter (HOSPITAL_COMMUNITY): Payer: Self-pay

## 2019-08-29 DIAGNOSIS — I10 Essential (primary) hypertension: Secondary | ICD-10-CM

## 2019-08-29 DIAGNOSIS — J029 Acute pharyngitis, unspecified: Secondary | ICD-10-CM | POA: Diagnosis not present

## 2019-08-29 MED ORDER — LIDOCAINE VISCOUS HCL 2 % MT SOLN: 15.0000 mL | OROMUCOSAL | 0 refills | Status: DC | PRN

## 2019-08-29 MED ORDER — CETIRIZINE-PSEUDOEPHEDRINE ER 5-120 MG PO TB12: 1.0000 | ORAL_TABLET | Freq: Every day | ORAL | 0 refills | Status: DC

## 2019-08-29 MED ORDER — MENTHOL 3 MG MT LOZG: 1.0000 | LOZENGE | OROMUCOSAL | 0 refills | Status: DC | PRN

## 2019-08-29 NOTE — Discharge Instructions (Signed)
Get plenty of rest and push fluids Cepacol prescribed for symptomatic relief Zyrtec D prescribed. Use daily for symptomatic relief Viscous lidocaine prescribed.  This is an oral solution you can swish, and gargle as needed for symptomatic relief of sore throat.  Do not exceed 8 doses in a 24 hour period.  Do not use prior to eating, as this will numb your entire mouth.   Drink warm or cool liquids, use throat lozenges, or popsicles to help alleviate symptoms Take OTC ibuprofen or tylenol as needed for pain Follow up with PCP if symptoms persists Return or go to ER if patient has any new or worsening symptoms such as fever, chills, nausea, vomiting, worsening sore throat, cough, abdominal pain, chest pain, changes in bowel or bladder habits, etc..Marland Kitchen

## 2019-08-29 NOTE — ED Provider Notes (Signed)
[facility]   [id number] 08/29/19 Arrival Time: 2482  NO:IBBC THROAT  SUBJECTIVE: History from: patient.  Cindy Taylor is a 74 y.o. female who presents w to the urgent care with a complaint of sore throat for the past 3 days.  Denies exposure to strep, flu or mono, or precipitating event. Patient states she has both Covid vaccine and has just completed amoxicillin course for tooth infection.  Developed sore throat 3 days after. Has tried OTC medication without relief.  Symptoms are made worse with swallowing, but tolerating liquids and own secretions without difficulty.  Denies previous symptoms in the past.  Denies chills, fever, nausea, vomiting, diarrhea.    Received flu shot this year: yes.  ROS: As per HPI.  All other pertinent ROS negative.     Past Medical History:  Diagnosis Date  . Arthritis   . Diabetes mellitus    mellitus? x 9 years  . Hepatitis C   . HTN (hypertension)    x8 years  . Hyperlipidemia    Past Surgical History:  Procedure Laterality Date  . TOTAL ABDOMINAL HYSTERECTOMY    . TUBAL LIGATION     No Known Allergies No current facility-administered medications on file prior to encounter.   Current Outpatient Medications on File Prior to Encounter  Medication Sig Dispense Refill  . aspirin 81 MG tablet Take 1 tablet (81 mg total) by mouth daily. 90 tablet 3  . Blood Glucose Monitoring Suppl (ONETOUCH VERIO) w/Device KIT Check blood sugars once daily 1 kit 0  . Calcium Citrate-Vitamin D 250-200 MG-UNIT TABS Take 1 tablet by mouth 2 (two) times daily with a meal.    . fexofenadine (ALLEGRA ALLERGY) 60 MG tablet Take 1 tablet (60 mg total) by mouth 2 (two) times daily. 60 tablet 3  . fluticasone (FLONASE) 50 MCG/ACT nasal spray Place 2 sprays into both nostrils daily. 16 g 6  . glipiZIDE (GLUCOTROL XL) 5 MG 24 hr tablet TAKE 1 TABLET BY MOUTH EVERY DAY WITH BREAKFAST. Must have office visit for refills 30 tablet 5  . glucose blood  (ONETOUCH VERIO) test strip Check blood sugars once a day 100 each 12  . lisinopril-hydrochlorothiazide (ZESTORETIC) 20-12.5 MG tablet Take 1 tablet by mouth daily. 90 tablet 3  . metFORMIN (GLUCOPHAGE) 500 MG tablet Take 1 tablet (500 mg total) by mouth 2 (two) times daily with a meal. 60 tablet 5  . metoprolol succinate (TOPROL-XL) 50 MG 24 hr tablet Take 1 tablet (50 mg total) by mouth daily. Take with or immediately following a meal. 90 tablet 3  . omeprazole (PRILOSEC) 20 MG capsule TAKE 1 CAPSULE(20 MG) BY MOUTH DAILY 30 capsule 0  . OneTouch Delica Lancets 48G MISC Check blood sugar fasting and before meals and again if pt feels bad (symptoms of hypo). 100 each 12  . pravastatin (PRAVACHOL) 20 MG tablet Take 1 tablet (20 mg total) by mouth daily. 90 tablet 3   Social History   Socioeconomic History  . Marital status: Single    Spouse name: Not on file  . Number of children: Not on file  . Years of education: Not on file  . Highest education level: Not on file  Occupational History  . Not on file  Tobacco Use  . Smoking status: Never Smoker  . Smokeless tobacco: Current User    Types: Snuff  Substance and Sexual Activity  . Alcohol use: No    Comment: glass of wine at special occasion  .  Drug use: No  . Sexual activity: Not Currently  Other Topics Concern  . Not on file  Social History Narrative  . Not on file   Social Determinants of Health   Financial Resource Strain:   . Difficulty of Paying Living Expenses:   Food Insecurity:   . Worried About Charity fundraiser in the Last Year:   . Arboriculturist in the Last Year:   Transportation Needs:   . Film/video editor (Medical):   Marland Kitchen Lack of Transportation (Non-Medical):   Physical Activity:   . Days of Exercise per Week:   . Minutes of Exercise per Session:   Stress:   . Feeling of Stress :   Social Connections:   . Frequency of Communication with Friends and Family:   . Frequency of Social Gatherings with  Friends and Family:   . Attends Religious Services:   . Active Member of Clubs or Organizations:   . Attends Archivist Meetings:   Marland Kitchen Marital Status:   Intimate Partner Violence:   . Fear of Current or Ex-Partner:   . Emotionally Abused:   Marland Kitchen Physically Abused:   . Sexually Abused:    Family History  Problem Relation Age of Onset  . Stroke Mother   . Rectal cancer Mother   . Colon polyps Neg Hx   . Colon cancer Neg Hx   . Esophageal cancer Neg Hx   . Stomach cancer Neg Hx     OBJECTIVE:  Vitals:   08/29/19 1125 08/29/19 1126  BP:  (!) 186/90  Pulse:  71  Resp:  16  Temp:  98.5 F (36.9 C)  TempSrc:  Oral  SpO2:  100%  Weight: 146 lb (66.2 kg)      General appearance: alert; appears fatigued, but nontoxic, speaking in full sentences and managing own secretions HEENT: NCAT; Ears: EACs clear, TMs pearly gray with visible cone of light, without erythema; Eyes: PERRL, EOMI grossly; Nose: no obvious rhinorrhea; Throat: oropharynx clear, tonsils 1+ and non erythematous without white tonsillar exudates, uvula midline Neck: supple without LAD Lungs: CTA bilaterally without adventitious breath sounds; cough absent Heart: regular rate and rhythm.  Radial pulses 2+ symmetrical bilaterally Skin: warm and dry Psychological: alert and cooperative; normal mood and affect  LABS: No results found for this or any previous visit (from the past 24 hour(s)).   ASSESSMENT & PLAN:  1. Sore throat     Meds ordered this encounter  Medications  . lidocaine (XYLOCAINE) 2 % solution    Sig: Use as directed 15 mLs in the mouth or throat as needed for mouth pain.    Dispense:  100 mL    Refill:  0  . cetirizine-pseudoephedrine (ZYRTEC-D) 5-120 MG tablet    Sig: Take 1 tablet by mouth daily.    Dispense:  30 tablet    Refill:  0  . menthol-cetylpyridinium (CEPACOL) 3 MG lozenge    Sig: Take 1 lozenge (3 mg total) by mouth as needed for sore throat.    Dispense:  100 tablet     Refill:  0   Discharge instruction Get plenty of rest and push fluids Cepacol prescribed for symptomatic relief Zyrtec D prescribed. Use daily for symptomatic relief Viscous lidocaine prescribed.  This is an oral solution you can swish, and gargle as needed for symptomatic relief of sore throat.  Do not exceed 8 doses in a 24 hour period.  Do not use prior to eating, as  this will numb your entire mouth.   Drink warm or cool liquids, use throat lozenges, or popsicles to help alleviate symptoms Take OTC ibuprofen or tylenol as needed for pain Follow up with PCP if symptoms persists Return or go to ER if patient has any new or worsening symptoms such as fever, chills, nausea, vomiting, worsening sore throat, cough, abdominal pain, chest pain, changes in bowel or bladder habits, etc...  Reviewed expectations re: course of current medical issues. Questions answered. Outlined signs and symptoms indicating need for more acute intervention. Patient verbalized understanding. After Visit Summary given.        Emerson Monte, FNP 08/29/19 1231

## 2019-08-29 NOTE — Telephone Encounter (Signed)
1) Medication(s) Requested (by name): metoprolol succinate (TOPROL-XL) 50 MG 24 hr tablet    2) [facility], [address] [place]  [place], [place] [id number]   3) Special Requests: Patient has an appt on 5/18 and is completely out of medication   Approved medications will be sent to the pharmacy, we will reach out if there is an issue.  Requests made after 3pm may not be addressed until the following business day!  If a patient is unsure of the name of the medication(s) please note and ask patient to call back when they are able to provide all info, do not send to responsible party until all information is available!

## 2019-08-29 NOTE — ED Triage Notes (Addendum)
Pt states she has a sore throat. Pt states she had some teeth pulled but there was abscess in one and it caused her to have a sore throat. This happened x 2 weeks ago. Pt states she has been on antibiotics for her teeth.Pt states she's out of one of her B/P meds.

## 2019-08-30 ENCOUNTER — Other Ambulatory Visit: Payer: Self-pay | Admitting: Internal Medicine

## 2019-08-30 DIAGNOSIS — I10 Essential (primary) hypertension: Secondary | ICD-10-CM

## 2019-08-30 MED ORDER — METOPROLOL SUCCINATE ER 50 MG PO TB24: 50.0000 mg | ORAL_TABLET | Freq: Every day | ORAL | 0 refills | Status: DC

## 2019-08-30 NOTE — Telephone Encounter (Signed)
Rx sent 

## 2019-09-04 ENCOUNTER — Encounter: Payer: Self-pay | Admitting: Internal Medicine

## 2019-09-04 ENCOUNTER — Ambulatory Visit: Payer: Medicare Other | Attending: Internal Medicine | Admitting: Internal Medicine

## 2019-09-04 ENCOUNTER — Other Ambulatory Visit: Payer: Self-pay

## 2019-09-04 VITALS — BP 143/82 | HR 87 | Temp 96.6°F | Resp 16 | Wt 145.0 lb

## 2019-09-04 DIAGNOSIS — E1165 Type 2 diabetes mellitus with hyperglycemia: Secondary | ICD-10-CM

## 2019-09-04 DIAGNOSIS — E785 Hyperlipidemia, unspecified: Secondary | ICD-10-CM

## 2019-09-04 DIAGNOSIS — E119 Type 2 diabetes mellitus without complications: Secondary | ICD-10-CM | POA: Diagnosis not present

## 2019-09-04 DIAGNOSIS — I10 Essential (primary) hypertension: Secondary | ICD-10-CM | POA: Diagnosis not present

## 2019-09-04 DIAGNOSIS — E1169 Type 2 diabetes mellitus with other specified complication: Secondary | ICD-10-CM | POA: Diagnosis not present

## 2019-09-04 LAB — GLUCOSE, POCT (MANUAL RESULT ENTRY): POC Glucose: 244 mg/dl — AB (ref 70–99)

## 2019-09-04 LAB — POCT GLYCOSYLATED HEMOGLOBIN (HGB A1C): HbA1c, POC (controlled diabetic range): 11.1 % — AB (ref 0.0–7.0)

## 2019-09-04 MED ORDER — METFORMIN HCL 1000 MG PO TABS: 1000.0000 mg | ORAL_TABLET | Freq: Two times a day (BID) | ORAL | 6 refills | Status: DC

## 2019-09-04 MED ORDER — METOPROLOL SUCCINATE ER 50 MG PO TB24: 75.0000 mg | ORAL_TABLET | Freq: Every day | ORAL | 6 refills | Status: DC

## 2019-09-04 MED ORDER — LANTUS SOLOSTAR 100 UNIT/ML ~~LOC~~ SOPN
10.0000 [IU] | PEN_INJECTOR | Freq: Every day | SUBCUTANEOUS | 99 refills | Status: DC
Start: 2019-09-04 — End: 2019-12-06

## 2019-09-04 MED ORDER — PEN NEEDLES 31G X 8 MM MISC: 6 refills | Status: DC

## 2019-09-04 NOTE — Progress Notes (Signed)
Patient ID: Cindy Taylor, female    DOB: 1946-01-02  MRN: [id number]  CC: Diabetes and Hypertension   Subjective: Cindy Taylor is a 74 y.o. female who presents for chronic ds management Her concerns today include:  Pt with DM, HTN, HL, Hep C treated with Harvoni, snuff user  DIABETES TYPE 2 Last A1C:   Results for orders placed or performed in visit on 09/04/19  POCT glucose (manual entry)  Result Value Ref Range   POC Glucose 244 (A) 70 - 99 mg/dl  POCT glycosylated hemoglobin (Hb A1C)  Result Value Ref Range   Hemoglobin A1C     HbA1c POC (<> result, manual entry)     HbA1c, POC (prediabetic range)     HbA1c, POC (controlled diabetic range) 11.1 (A) 0.0 - 7.0 %    Med Adherence:  '[x]'  Yes -Metformin and Glucotrol    Medication side effects:  '[]'  Yes    '[x]'  No Home Monitoring?  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  Hypoglycemic episodes?: '[]'  Yes    '[x]'  No Numbness of the feet? '[]'  Yes    '[x]'  No Retinopathy hx? '[]'  Yes    '[x]'  No but has cataracts BL Last eye exam: 02/2019 Comments:   HYPERTENSION Currently taking: see medication list.  She is on lisinopril/HCTZ and metoprolol 50 mg daily.  She has taken meds already for today. Med Adherence: '[x]'  Yes    '[]'  No Medication side effects: '[x]'  Yes    '[]'  No Adherence with salt restriction: '[x]'  Yes    '[]'  No Home Monitoring?: '[]'  Yes    '[x]'  No Monitoring Frequency: '[]'  Yes    '[]'  No Home BP results range: '[]'  Yes    '[]'  No SOB? '[]'  Yes    '[x]'  No Chest Pain?: '[]'  Yes    '[x]'  No Leg swelling?: '[]'  Yes    '[x]'  No Headaches?: '[]'  Yes    '[x]'  No Dizziness? '[]'  Yes    '[x]'  No Comments:   HL:  Taking and tolerating Pravachol Patient Active Problem List   Diagnosis Date Noted  . Unintentional weight loss 10/22/2016  . Chews  tobacco 01/13/2016  . Onychomycosis of multiple toenails with type 2 diabetes mellitus ([place]) 01/13/2016  . History of hepatitis C 05/23/2014  . DM type 2 (diabetes mellitus, type 2) ([place]) 11/16/2012  . Dyslipidemia 11/16/2012  . Essential hypertension, benign 12/12/2008     Current Outpatient Medications on File Prior to Visit  Medication Sig Dispense Refill  . aspirin 81 MG tablet Take 1 tablet (81 mg total) by mouth daily. 90 tablet 3  . Blood Glucose Monitoring Suppl (ONETOUCH VERIO) w/Device KIT Check blood sugars once daily 1 kit 0  . Calcium Citrate-Vitamin D 250-200 MG-UNIT TABS Take 1 tablet by mouth 2 (two) times daily with a meal.    . cetirizine-pseudoephedrine (ZYRTEC-D) 5-120 MG tablet Take 1 tablet by mouth daily. 30 tablet 0  . fexofenadine (ALLEGRA ALLERGY) 60 MG tablet Take 1 tablet (60 mg total) by mouth 2 (two) times daily. 60 tablet 3  . fluticasone (FLONASE) 50 MCG/ACT nasal spray Place 2 sprays into both nostrils daily. 16 g 6  . glucose blood (ONETOUCH VERIO) test strip Check blood sugars once a  day 100 each 12  . lidocaine (XYLOCAINE) 2 % solution Use as directed 15 mLs in the mouth or throat as needed for mouth pain. 100 mL 0  . lisinopril-hydrochlorothiazide (ZESTORETIC) 20-12.5 MG tablet Take 1 tablet by mouth daily. 90 tablet 3  . menthol-cetylpyridinium (CEPACOL) 3 MG lozenge Take 1 lozenge (3 mg total) by mouth as needed for sore throat. 100 tablet 0  . omeprazole (PRILOSEC) 20 MG capsule TAKE 1 CAPSULE(20 MG) BY MOUTH DAILY 30 capsule 0  . OneTouch Delica Lancets 00T MISC Check blood sugar fasting and before meals and again if pt feels bad (symptoms of hypo). 100 each 12  . pravastatin (PRAVACHOL) 20 MG tablet Take 1 tablet (20 mg total) by mouth daily. 90 tablet 3   No current facility-administered medications on file prior to visit.    No Known Allergies  Social History   Socioeconomic History  . Marital status: Single    Spouse name: Not on file  .  Number of children: Not on file  . Years of education: Not on file  . Highest education level: Not on file  Occupational History  . Not on file  Tobacco Use  . Smoking status: Never Smoker  . Smokeless tobacco: Current User    Types: Snuff  Substance and Sexual Activity  . Alcohol use: No    Comment: glass of wine at special occasion  . Drug use: No  . Sexual activity: Not Currently  Other Topics Concern  . Not on file  Social History Narrative  . Not on file   Social Determinants of Health   Financial Resource Strain:   . Difficulty of Paying Living Expenses:   Food Insecurity:   . Worried About Charity fundraiser in the Last Year:   . Arboriculturist in the Last Year:   Transportation Needs:   . Film/video editor (Medical):   Marland Kitchen Lack of Transportation (Non-Medical):   Physical Activity:   . Days of Exercise per Week:   . Minutes of Exercise per Session:   Stress:   . Feeling of Stress :   Social Connections:   . Frequency of Communication with Friends and Family:   . Frequency of Social Gatherings with Friends and Family:   . Attends Religious Services:   . Active Member of Clubs or Organizations:   . Attends Archivist Meetings:   Marland Kitchen Marital Status:   Intimate Partner Violence:   . Fear of Current or Ex-Partner:   . Emotionally Abused:   Marland Kitchen Physically Abused:   . Sexually Abused:     Family History  Problem Relation Age of Onset  . Stroke Mother   . Rectal cancer Mother   . Colon polyps Neg Hx   . Colon cancer Neg Hx   . Esophageal cancer Neg Hx   . Stomach cancer Neg Hx     Past Surgical History:  Procedure Laterality Date  . TOTAL ABDOMINAL HYSTERECTOMY    . TUBAL LIGATION      ROS: Review of Systems Negative except as stated above  PHYSICAL EXAM: BP (!) 143/82   Pulse 87   Temp (!) 96.6 F (35.9 C)   Resp 16   Wt 145 lb (65.8 kg)   SpO2 98%   BMI 25.69 kg/m   Wt Readings from Last 3 Encounters:  09/04/19 145 lb (65.8  kg)  08/29/19 146 lb (66.2 kg)  02/15/19 148 lb (67.1 kg)    Physical  Exam  General appearance - alert, well appearing, and in no distress Mental status - normal mood, behavior, speech, dress, motor activity, and thought processes Eyes - pupils equal and reactive, extraocular eye movements intact Mouth -patient is edentulous above.  She has several teeth in the lower jaw Neck - supple, no significant adenopathy Chest -breath sounds slightly decreased bilaterally without wheezes or crackles. Heart - normal rate, regular rhythm, normal S1, S2, no murmurs, rubs, clicks or gallops Extremities - peripheral pulses normal, no pedal edema, no clubbing or cyanosis Diabetic Foot Exam - Simple   Simple Foot Form Visual Inspection See comments: Yes Sensation Testing Intact to touch and monofilament testing bilaterally: Yes Pulse Check Posterior Tibialis and Dorsalis pulse intact bilaterally: Yes Comments Toenails on the big toes are thick.  She has small callus on the medial aspect of both big toes.      CMP Latest Ref Rng & Units 07/03/2018 01/24/2017 10/28/2016  Glucose 65 - 99 mg/dL 133(H) 75 135(H)  BUN 8 - 27 mg/dL '14 12 14  ' Creatinine 0.57 - 1.00 mg/dL 0.76 1.01(H) 0.78  Sodium 134 - 144 mmol/L 141 143 142  Potassium 3.5 - 5.2 mmol/L 4.4 4.0 4.4  Chloride 96 - 106 mmol/L 102 103 101  CO2 20 - 29 mmol/L '23 22 23  ' Calcium 8.7 - 10.3 mg/dL 9.9 9.5 9.4  Total Protein 6.0 - 8.5 g/dL 7.4 7.4 7.0  Total Bilirubin 0.0 - 1.2 mg/dL 0.4 <0.2 0.3  Alkaline Phos 39 - 117 IU/L 59 51 47  AST 0 - 40 IU/L '16 17 18  ' ALT 0 - 32 IU/L '13 12 14   ' Lipid Panel     Component Value Date/Time   CHOL 168 07/03/2018 1311   TRIG 58 07/03/2018 1311   HDL 56 07/03/2018 1311   CHOLHDL 3.0 07/03/2018 1311   CHOLHDL 2.8 07/29/2015 0918   VLDL 16 07/29/2015 0918   LDLCALC 100 (H) 07/03/2018 1311   LDLDIRECT 71 12/03/2009 2014    CBC    Component Value Date/Time   WBC 6.6 07/03/2018 1311   WBC 7.0  12/13/2014 1031   RBC 5.06 07/03/2018 1311   RBC 4.78 12/13/2014 1031   HGB 13.4 07/03/2018 1311   HCT 41.5 07/03/2018 1311   PLT 184 07/03/2018 1311   MCV 82 07/03/2018 1311   MCH 26.5 (L) 07/03/2018 1311   MCH 26.6 12/13/2014 1031   MCHC 32.3 07/03/2018 1311   MCHC 32.7 12/13/2014 1031   RDW 14.0 07/03/2018 1311   LYMPHSABS 3.9 07/24/2013 1741   MONOABS 0.6 07/24/2013 1741   EOSABS 0.1 07/24/2013 1741   BASOSABS 0.0 07/24/2013 1741    ASSESSMENT AND PLAN: 1. Type 2 diabetes mellitus without complication, without long-term current use of insulin ([place]) -Not controlled.  Recommend discontinue Glucotrol.  Increase Metformin to 1000 mg twice a day.  Add Lantus insulin 10 units daily.  Patient was on insulin pen in the past and feels that she does not need updated teaching on administration. Discussed and encourage healthy eating habits.  Encouraged her to stay active. - POCT glucose (manual entry) - POCT glycosylated hemoglobin (Hb A1C) - CBC - Comprehensive metabolic panel - Lipid panel - metFORMIN (GLUCOPHAGE) 1000 MG tablet; Take 1 tablet (1,000 mg total) by mouth 2 (two) times daily with a meal.  Dispense: 60 tablet; Refill: 6  2. Essential hypertension Not at goal.  Continue lisinopril/HCTZ.  Increase metoprolol to 75 mg daily. - metoprolol succinate (TOPROL-XL) 50 MG  24 hr tablet; Take 1.5 tablets (75 mg total) by mouth daily.  Dispense: 45 tablet; Refill: 6  3. Hyperlipidemia associated with type 2 diabetes mellitus (HCC) Continue Pravachol.  HM: Patient has completed COVID-19 vaccine series but did not bring her card with her today.  I request that she bring it on next visit so that we can update her health maintenance.    Patient was given the opportunity to ask questions.  Patient verbalized understanding of the plan and was able to repeat key elements of the plan.   Orders Placed This Encounter  Procedures  . CBC  . Comprehensive metabolic panel  . Lipid panel    . POCT glucose (manual entry)  . POCT glycosylated hemoglobin (Hb A1C)     Requested Prescriptions   Signed Prescriptions Disp Refills  . insulin glargine (LANTUS SOLOSTAR) 100 UNIT/ML Solostar Pen 5 pen PRN    Sig: Inject 10 Units into the skin daily.  . metFORMIN (GLUCOPHAGE) 1000 MG tablet 60 tablet 6    Sig: Take 1 tablet (1,000 mg total) by mouth 2 (two) times daily with a meal.  . metoprolol succinate (TOPROL-XL) 50 MG 24 hr tablet 45 tablet 6    Sig: Take 1.5 tablets (75 mg total) by mouth daily.    Return in about 2 months (around 11/04/2019).  Karle Plumber, MD, FACP

## 2019-09-04 NOTE — Patient Instructions (Signed)
Your blood pressure is not at goal.  We have increase metoprolol to 1-1/2 tablets daily.  Your blood sugar is not at goal.  Stop the glipizide. Start Lantus insulin 10 units at bedtime. Increase Metformin to 1000 mg twice a day.   Diabetes Mellitus and Nutrition, Adult When you have diabetes (diabetes mellitus), it is very important to have healthy eating habits because your blood sugar (glucose) levels are greatly affected by what you eat and drink. Eating healthy foods in the appropriate amounts, at about the same times every day, can help you:  Control your blood glucose.  Lower your risk of heart disease.  Improve your blood pressure.  Reach or maintain a healthy weight. Every person with diabetes is different, and each person has different needs for a meal plan. Your health care provider may recommend that you work with a diet and nutrition specialist (dietitian) to make a meal plan that is best for you. Your meal plan may vary depending on factors such as:  The calories you need.  The medicines you take.  Your weight.  Your blood glucose, blood pressure, and cholesterol levels.  Your activity level.  Other health conditions you have, such as heart or kidney disease. How do carbohydrates affect me? Carbohydrates, also called carbs, affect your blood glucose level more than any other type of food. Eating carbs naturally raises the amount of glucose in your blood. Carb counting is a method for keeping track of how many carbs you eat. Counting carbs is important to keep your blood glucose at a healthy level, especially if you use insulin or take certain oral diabetes medicines. It is important to know how many carbs you can safely have in each meal. This is different for every person. Your dietitian can help you calculate how many carbs you should have at each meal and for each snack. Foods that contain carbs include:  Bread, cereal, rice, pasta, and crackers.  Potatoes and  corn.  Peas, beans, and lentils.  Milk and yogurt.  Fruit and juice.  Desserts, such as cakes, cookies, ice cream, and candy. How does alcohol affect me? Alcohol can cause a sudden decrease in blood glucose (hypoglycemia), especially if you use insulin or take certain oral diabetes medicines. Hypoglycemia can be a life-threatening condition. Symptoms of hypoglycemia (sleepiness, dizziness, and confusion) are similar to symptoms of having too much alcohol. If your health care provider says that alcohol is safe for you, follow these guidelines:  Limit alcohol intake to no more than 1 drink per day for nonpregnant women and 2 drinks per day for men. One drink equals 12 oz of beer, 5 oz of wine, or 1 oz of hard liquor.  Do not drink on an empty stomach.  Keep yourself hydrated with water, diet soda, or unsweetened iced tea.  Keep in mind that regular soda, juice, and other mixers may contain a lot of sugar and must be counted as carbs. What are tips for following this plan?  Reading food labels  Start by checking the serving size on the "Nutrition Facts" label of packaged foods and drinks. The amount of calories, carbs, fats, and other nutrients listed on the label is based on one serving of the item. Many items contain more than one serving per package.  Check the total grams (g) of carbs in one serving. You can calculate the number of servings of carbs in one serving by dividing the total carbs by 15. For example, if a food has  30 g of total carbs, it would be equal to 2 servings of carbs.  Check the number of grams (g) of saturated and trans fats in one serving. Choose foods that have low or no amount of these fats.  Check the number of milligrams (mg) of salt (sodium) in one serving. Most people should limit total sodium intake to less than 2,300 mg per day.  Always check the nutrition information of foods labeled as "low-fat" or "nonfat". These foods may be higher in added sugar or  refined carbs and should be avoided.  Talk to your dietitian to identify your daily goals for nutrients listed on the label. Shopping  Avoid buying canned, premade, or processed foods. These foods tend to be high in fat, sodium, and added sugar.  Shop around the outside edge of the grocery store. This includes fresh fruits and vegetables, bulk grains, fresh meats, and fresh dairy. Cooking  Use low-heat cooking methods, such as baking, instead of high-heat cooking methods like deep frying.  Cook using healthy oils, such as olive, canola, or sunflower oil.  Avoid cooking with butter, cream, or high-fat meats. Meal planning  Eat meals and snacks regularly, preferably at the same times every day. Avoid going long periods of time without eating.  Eat foods high in fiber, such as fresh fruits, vegetables, beans, and whole grains. Talk to your dietitian about how many servings of carbs you can eat at each meal.  Eat 4-6 ounces (oz) of lean protein each day, such as lean meat, chicken, fish, eggs, or tofu. One oz of lean protein is equal to: ? 1 oz of meat, chicken, or fish. ? 1 egg. ?  cup of tofu.  Eat some foods each day that contain healthy fats, such as avocado, nuts, seeds, and fish. Lifestyle  Check your blood glucose regularly.  Exercise regularly as told by your health care provider. This may include: ? 150 minutes of moderate-intensity or vigorous-intensity exercise each week. This could be brisk walking, biking, or water aerobics. ? Stretching and doing strength exercises, such as yoga or weightlifting, at least 2 times a week.  Take medicines as told by your health care provider.  Do not use any products that contain nicotine or tobacco, such as cigarettes and e-cigarettes. If you need help quitting, ask your health care provider.  Work with a Social worker or diabetes educator to identify strategies to manage stress and any emotional and social challenges. Questions to ask a  health care provider  Do I need to meet with a diabetes educator?  Do I need to meet with a dietitian?  What number can I call if I have questions?  When are the best times to check my blood glucose? Where to find more information:  American Diabetes Association: diabetes.org  Academy of Nutrition and Dietetics: www.eatright.[employer] of Diabetes and Digestive and Kidney Diseases (NIH): [employer] Summary  A healthy meal plan will help you control your blood glucose and maintain a healthy lifestyle.  Working with a diet and nutrition specialist (dietitian) can help you make a meal plan that is best for you.  Keep in mind that carbohydrates (carbs) and alcohol have immediate effects on your blood glucose levels. It is important to count carbs and to use alcohol carefully. This information is not intended to replace advice given to you by your health care provider. Make sure you discuss any questions you have with your health care provider. Document Revised: 03/18/2017 Document Reviewed: 05/10/2016 Elsevier  Patient Education  [employer].

## 2019-09-05 ENCOUNTER — Telehealth: Payer: Self-pay | Admitting: Internal Medicine

## 2019-09-05 DIAGNOSIS — N289 Disorder of kidney and ureter, unspecified: Secondary | ICD-10-CM

## 2019-09-05 LAB — COMPREHENSIVE METABOLIC PANEL
ALT: 13 IU/L (ref 0–32)
AST: 14 IU/L (ref 0–40)
Albumin/Globulin Ratio: 1.4 (ref 1.2–2.2)
Albumin: 4.2 g/dL (ref 3.7–4.7)
Alkaline Phosphatase: 76 IU/L (ref 48–121)
BUN/Creatinine Ratio: 10 — ABNORMAL LOW (ref 12–28)
BUN: 11 mg/dL (ref 8–27)
Bilirubin Total: 0.2 mg/dL (ref 0.0–1.2)
CO2: 28 mmol/L (ref 20–29)
Calcium: 9.6 mg/dL (ref 8.7–10.3)
Chloride: 102 mmol/L (ref 96–106)
Creatinine, Ser: 1.11 mg/dL — ABNORMAL HIGH (ref 0.57–1.00)
GFR calc Af Amer: 58 mL/min/{1.73_m2} — ABNORMAL LOW (ref >59)
GFR calc non Af Amer: 50 mL/min/{1.73_m2} — ABNORMAL LOW (ref >59)
Globulin, Total: 3 g/dL (ref 1.5–4.5)
Glucose: 215 mg/dL — ABNORMAL HIGH (ref 65–99)
Potassium: 4.7 mmol/L (ref 3.5–5.2)
Sodium: 144 mmol/L (ref 134–144)
Total Protein: 7.2 g/dL (ref 6.0–8.5)

## 2019-09-05 LAB — CBC
Hematocrit: 40.2 % (ref 34.0–46.6)
Hemoglobin: 13.1 g/dL (ref 11.1–15.9)
MCH: 27.2 pg (ref 26.6–33.0)
MCHC: 32.6 g/dL (ref 31.5–35.7)
MCV: 83 fL (ref 79–97)
Platelets: 201 10*3/uL (ref 150–450)
RBC: 4.82 x10E6/uL (ref 3.77–5.28)
RDW: 13.1 % (ref 11.7–15.4)
WBC: 7.8 10*3/uL (ref 3.4–10.8)

## 2019-09-05 LAB — LIPID PANEL
Chol/HDL Ratio: 4.1 ratio (ref 0.0–4.4)
Cholesterol, Total: 158 mg/dL (ref 100–199)
HDL: 39 mg/dL — ABNORMAL LOW (ref >39)
LDL Chol Calc (NIH): 77 mg/dL (ref 0–99)
Triglycerides: 259 mg/dL — ABNORMAL HIGH (ref 0–149)
VLDL Cholesterol Cal: 42 mg/dL — ABNORMAL HIGH (ref 5–40)

## 2019-09-05 NOTE — Telephone Encounter (Signed)
Phone call placed to patient today to discuss lab results.  Patient informed that her blood count is normal meaning no anemia.  Liver function tests normal.  Cholesterol close to goal with LDL cholesterol being 77.  Kidney function is not 100%.  Current GFR is 58 compared to 92 a year ago.  Patient is not taking any over-the-counter NSAIDs.  She does take a multivitamin.  Advised patient that uncontrolled diabetes and blood pressure can affect the kidney function but some medications can to.  Yesterday we increase Metformin from 500 mg twice a day to 1000 mg twice a day.  Advised that once she has been on the higher dose for 1 to 2 weeks, I would like for her to return to the lab to have kidney function rechecked.  Patient expressed understanding and all questions were answered.  Results for orders placed or performed in visit on 09/04/19  CBC  Result Value Ref Range   WBC 7.8 3.4 - 10.8 x10E3/uL   RBC 4.82 3.77 - 5.28 x10E6/uL   Hemoglobin 13.1 11.1 - 15.9 g/dL   Hematocrit 40.2 34.0 - 46.6 %   MCV 83 79 - 97 fL   MCH 27.2 26.6 - 33.0 pg   MCHC 32.6 31.5 - 35.7 g/dL   RDW 13.1 11.7 - 15.4 %   Platelets 201 150 - 450 x10E3/uL  Comprehensive metabolic panel  Result Value Ref Range   Glucose 215 (H) 65 - 99 mg/dL   BUN 11 8 - 27 mg/dL   Creatinine, Ser 1.11 (H) 0.57 - 1.00 mg/dL   GFR calc non Af Amer 50 (L) >59 mL/min/1.73   GFR calc Af Amer 58 (L) >59 mL/min/1.73   BUN/Creatinine Ratio 10 (L) 12 - 28   Sodium 144 134 - 144 mmol/L   Potassium 4.7 3.5 - 5.2 mmol/L   Chloride 102 96 - 106 mmol/L   CO2 28 20 - 29 mmol/L   Calcium 9.6 8.7 - 10.3 mg/dL   Total Protein 7.2 6.0 - 8.5 g/dL   Albumin 4.2 3.7 - 4.7 g/dL   Globulin, Total 3.0 1.5 - 4.5 g/dL   Albumin/Globulin Ratio 1.4 1.2 - 2.2   Bilirubin Total 0.2 0.0 - 1.2 mg/dL   Alkaline Phosphatase 76 48 - 121 IU/L   AST 14 0 - 40 IU/L   ALT 13 0 - 32 IU/L  Lipid panel  Result Value Ref Range   Cholesterol, Total 158 100 - 199 mg/dL    Triglycerides 259 (H) 0 - 149 mg/dL   HDL 39 (L) >39 mg/dL   VLDL Cholesterol Cal 42 (H) 5 - 40 mg/dL   LDL Chol Calc (NIH) 77 0 - 99 mg/dL   Chol/HDL Ratio 4.1 0.0 - 4.4 ratio  POCT glucose (manual entry)  Result Value Ref Range   POC Glucose 244 (A) 70 - 99 mg/dl  POCT glycosylated hemoglobin (Hb A1C)  Result Value Ref Range   Hemoglobin A1C     HbA1c POC (<> result, manual entry)     HbA1c, POC (prediabetic range)     HbA1c, POC (controlled diabetic range) 11.1 (A) 0.0 - 7.0 %

## 2019-09-20 ENCOUNTER — Other Ambulatory Visit: Payer: Self-pay

## 2019-09-20 DIAGNOSIS — N289 Disorder of kidney and ureter, unspecified: Secondary | ICD-10-CM | POA: Diagnosis not present

## 2019-09-21 ENCOUNTER — Telehealth: Payer: Self-pay

## 2019-09-21 LAB — BASIC METABOLIC PANEL
BUN/Creatinine Ratio: 12 (ref 12–28)
BUN: 12 mg/dL (ref 8–27)
CO2: 26 mmol/L (ref 20–29)
Calcium: 9.7 mg/dL (ref 8.7–10.3)
Chloride: 101 mmol/L (ref 96–106)
Creatinine, Ser: 0.97 mg/dL (ref 0.57–1.00)
GFR calc Af Amer: 68 mL/min/{1.73_m2} (ref >59)
GFR calc non Af Amer: 59 mL/min/{1.73_m2} — ABNORMAL LOW (ref >59)
Glucose: 151 mg/dL — ABNORMAL HIGH (ref 65–99)
Potassium: 4 mmol/L (ref 3.5–5.2)
Sodium: 140 mmol/L (ref 134–144)

## 2019-09-21 LAB — MICROALBUMIN / CREATININE URINE RATIO
Creatinine, Urine: 67.5 mg/dL
Microalb/Creat Ratio: 10 mg/g creat (ref 0–29)
Microalbumin, Urine: 6.5 ug/mL

## 2019-09-21 NOTE — Telephone Encounter (Signed)
Patient was called and a voicemail was left informing patient to return phone call for lab results. 

## 2019-09-21 NOTE — Telephone Encounter (Signed)
-----   Message from Ladell Pier, MD sent at 09/21/2019  8:13 AM EDT ----- Let patient know that kidney function has improved compared to 2 weeks ago.  Continue the Metformin as prescribed.

## 2019-10-05 ENCOUNTER — Other Ambulatory Visit: Payer: Self-pay | Admitting: Internal Medicine

## 2019-10-05 DIAGNOSIS — E785 Hyperlipidemia, unspecified: Secondary | ICD-10-CM

## 2019-10-18 DIAGNOSIS — S0501XA Injury of conjunctiva and corneal abrasion without foreign body, right eye, initial encounter: Secondary | ICD-10-CM | POA: Diagnosis not present

## 2019-10-19 DIAGNOSIS — S0501XD Injury of conjunctiva and corneal abrasion without foreign body, right eye, subsequent encounter: Secondary | ICD-10-CM | POA: Diagnosis not present

## 2019-10-26 DIAGNOSIS — S0501XD Injury of conjunctiva and corneal abrasion without foreign body, right eye, subsequent encounter: Secondary | ICD-10-CM | POA: Diagnosis not present

## 2019-11-06 ENCOUNTER — Telehealth: Payer: Medicare Other | Admitting: Internal Medicine

## 2019-11-09 ENCOUNTER — Telehealth: Payer: Medicare Other | Admitting: Internal Medicine

## 2019-12-06 ENCOUNTER — Ambulatory Visit: Payer: Medicare Other | Attending: Internal Medicine | Admitting: Internal Medicine

## 2019-12-06 ENCOUNTER — Other Ambulatory Visit: Payer: Self-pay

## 2019-12-06 ENCOUNTER — Ambulatory Visit: Payer: Medicare Other | Attending: Internal Medicine | Admitting: Pharmacist

## 2019-12-06 ENCOUNTER — Encounter: Payer: Self-pay | Admitting: Internal Medicine

## 2019-12-06 VITALS — BP 122/76 | HR 76 | Temp 98.6°F | Resp 16 | Wt 145.6 lb

## 2019-12-06 DIAGNOSIS — Z23 Encounter for immunization: Secondary | ICD-10-CM | POA: Diagnosis not present

## 2019-12-06 DIAGNOSIS — E119 Type 2 diabetes mellitus without complications: Secondary | ICD-10-CM

## 2019-12-06 DIAGNOSIS — E1165 Type 2 diabetes mellitus with hyperglycemia: Secondary | ICD-10-CM | POA: Diagnosis not present

## 2019-12-06 DIAGNOSIS — I10 Essential (primary) hypertension: Secondary | ICD-10-CM | POA: Diagnosis not present

## 2019-12-06 DIAGNOSIS — Z794 Long term (current) use of insulin: Secondary | ICD-10-CM | POA: Diagnosis not present

## 2019-12-06 DIAGNOSIS — E785 Hyperlipidemia, unspecified: Secondary | ICD-10-CM

## 2019-12-06 DIAGNOSIS — E1169 Type 2 diabetes mellitus with other specified complication: Secondary | ICD-10-CM

## 2019-12-06 DIAGNOSIS — M25511 Pain in right shoulder: Secondary | ICD-10-CM

## 2019-12-06 DIAGNOSIS — N289 Disorder of kidney and ureter, unspecified: Secondary | ICD-10-CM

## 2019-12-06 DIAGNOSIS — G8929 Other chronic pain: Secondary | ICD-10-CM

## 2019-12-06 LAB — GLUCOSE, POCT (MANUAL RESULT ENTRY): POC Glucose: 160 mg/dl — AB (ref 70–99)

## 2019-12-06 LAB — POCT GLYCOSYLATED HEMOGLOBIN (HGB A1C): HbA1c, POC (controlled diabetic range): 7 % (ref 0.0–7.0)

## 2019-12-06 MED ORDER — LANTUS SOLOSTAR 100 UNIT/ML ~~LOC~~ SOPN: 10.0000 [IU] | PEN_INJECTOR | Freq: Every day | SUBCUTANEOUS | 6 refills | Status: DC

## 2019-12-06 NOTE — Progress Notes (Signed)
Patient presents for vaccination against influenza per orders of Dr. Johnson. Consent given. Counseling provided. No contraindications exists. Vaccine administered without incident.   

## 2019-12-06 NOTE — Progress Notes (Signed)
Patient ID: Cindy Taylor, female    DOB: 26-Nov-1945  MRN: [id number]  CC: Diabetes and Hypertension   Subjective: Cindy Taylor is a 74 y.o. female who presents for chronic ds management. Last seen 5/22021 Her concerns today include:  Pt with DM, HTN, HL, Hep C treated with Harvoni, snuff user  C/o pain RT shoulder x 2-3 mths.  No initiating factors. Constant but worse when she uses her arm.  Most of the time, pain is at 5/10. 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  Does not do a lot of heavy lifting but does mop a lot. No taking anything for pain.  DIABETES TYPE 2 Last A1C:   Results for orders placed or performed in visit on 12/06/19  POCT glucose (manual entry)  Result Value Ref Range   POC Glucose 160 (A) 70 - 99 mg/dl  POCT glycosylated hemoglobin (Hb A1C)  Result Value Ref Range   Hemoglobin A1C     HbA1c POC (<> result, manual entry)     HbA1c, POC (prediabetic range)     HbA1c, POC (controlled diabetic range) 7.0 0.0 - 7.0 %    Med Adherence:  _0  Yes    _1  No Medication side effects:  _2  Yes    _3  No Home Monitoring?  _4  Yes  2x/wk Home glucose results range: yesterday morning was 151, last night after dinner 167, this a.m was 118 Diet Adherence: _5  Yes    _6  No Exercise: _7  Yes    _8  No Hypoglycemic episodes?: occurred only one time when she did not eat breakfast. She did feel it Numbness of the feet? _9  Yes    _10  No Retinopathy hx? _11  Yes    _12  No Last eye exam:  Comments:   HYPERTENSION Currently taking: see medication list Med Adherence: _13  Yes    _14  No Medication side effects: _15  Yes    _16  No Adherence with salt restriction: _17  Yes    _18  No Home Monitoring?: _19  Yes    _20  No Monitoring Frequency: _21  Yes    _22  No Home BP results range: _23  Yes    _24  No SOB? _25  Yes    _26  No Chest Pain?: _27  Yes    _28  No Leg swelling?: _29  Yes    _30  No Headaches?: _31  Yes    _32  No Dizziness? _33  Yes    _34   No Comments:   HL:  Continue Pravachol  CKD 2: Her GFR had improved from 58 to 68 on repeat check.  Patient Active Problem List   Diagnosis Date Noted  . Unintentional weight loss 10/22/2016  . Chews tobacco 01/13/2016  . Onychomycosis of multiple toenails with type 2 diabetes mellitus ([place]) 01/13/2016  . History of hepatitis C 05/23/2014  . DM type 2 (diabetes mellitus, type 2) ([place]) 11/16/2012  . Hyperlipidemia associated with type 2 diabetes mellitus ([place]) 11/16/2012  . Essential hypertension, benign 12/12/2008     Current Outpatient Medications on File Prior to Visit  Medication Sig Dispense Refill  . aspirin 81 MG tablet Take 1 tablet (81 mg total) by mouth daily. 90 tablet 3  . Blood Glucose Monitoring Suppl (ONETOUCH VERIO) w/Device KIT Check blood sugars once daily 1 kit 0  . Calcium Citrate-Vitamin D 250-200 MG-UNIT TABS Take 1 tablet by mouth 2 (two) times daily with a meal.    . cetirizine-pseudoephedrine (ZYRTEC-D) 5-120 MG tablet Take 1 tablet by mouth daily. 30 tablet 0  .  fexofenadine (ALLEGRA ALLERGY) 60 MG tablet Take 1 tablet (60 mg total) by mouth 2 (two) times daily. 60 tablet 3  . fluticasone (FLONASE) 50 MCG/ACT nasal spray Place 2 sprays into both nostrils daily. 16 g 6  . glucose blood (ONETOUCH VERIO) test strip Check blood sugars once a day 100 each 12  . Insulin Pen Needle (PEN NEEDLES) 31G X 8 MM MISC UAD 100 each 6  . lidocaine (XYLOCAINE) 2 % solution Use as directed 15 mLs in the mouth or throat as needed for mouth pain. 100 mL 0  . lisinopril-hydrochlorothiazide (ZESTORETIC) 20-12.5 MG tablet Take 1 tablet by mouth daily. 90 tablet 3  . menthol-cetylpyridinium (CEPACOL) 3 MG lozenge Take 1 lozenge (3 mg total) by mouth as needed for sore throat. 100 tablet 0  . metFORMIN (GLUCOPHAGE) 1000 MG tablet Take 1 tablet (1,000 mg total) by mouth 2 (two) times daily with a meal. 60 tablet 6  . metoprolol succinate (TOPROL-XL) 50 MG 24 hr tablet Take 1.5 tablets  (75 mg total) by mouth daily. 45 tablet 6  . omeprazole (PRILOSEC) 20 MG capsule TAKE 1 CAPSULE(20 MG) BY MOUTH DAILY 30 capsule 0  . OneTouch Delica Lancets 44Y MISC Check blood sugar fasting and before meals and again if pt feels bad (symptoms of hypo). 100 each 12  . pravastatin (PRAVACHOL) 20 MG tablet TAKE 1 TABLET(20 MG) BY MOUTH DAILY 90 tablet 3   No current facility-administered medications on file prior to visit.    No Known Allergies  Social History   Socioeconomic History  . Marital status: Single    Spouse name: Not on file  . Number of children: Not on file  . Years of education: Not on file  . Highest education level: Not on file  Occupational History  . Not on file  Tobacco Use  . Smoking status: Never Smoker  . Smokeless tobacco: Current User    Types: Snuff  Substance and Sexual Activity  . Alcohol use: No    Comment: glass of wine at special occasion  . Drug use: No  . Sexual activity: Not Currently  Other Topics Concern  . Not on file  Social History Narrative  . Not on file   Social Determinants of Health   Financial Resource Strain:   . Difficulty of Paying Living Expenses: Not on file  Food Insecurity:   . Worried About Taylor fundraiser in the Last Year: Not on file  . Ran Out of Food in the Last Year: Not on file  Transportation Needs:   . Lack of Transportation (Medical): Not on file  . Lack of Transportation (Non-Medical): Not on file  Physical Activity:   . Days of Exercise per Week: Not on file  . Minutes of Exercise per Session: Not on file  Stress:   . Feeling of Stress : Not on file  Social Connections:   . Frequency of Communication with Friends and Family: Not on file  . Frequency of Social Gatherings with Friends and Family: Not on file  . Attends Religious Services: Not on file  . Active Member of Clubs or Organizations: Not on file  . Attends Archivist Meetings: Not on file  . Marital Status: Not on file   Intimate Partner Violence:   . Fear of Current or Ex-Partner: Not on file  . Emotionally Abused: Not on file  . Physically Abused: Not on file  . Sexually Abused: Not on file    Family  History  Problem Relation Age of Onset  . Stroke Mother   . Rectal cancer Mother   . Colon polyps Neg Hx   . Colon cancer Neg Hx   . Esophageal cancer Neg Hx   . Stomach cancer Neg Hx     Past Surgical History:  Procedure Laterality Date  . TOTAL ABDOMINAL HYSTERECTOMY    . TUBAL LIGATION      ROS: Review of Systems Negative except as stated above  PHYSICAL EXAM: BP 122/76   Pulse 76   Temp 98.6 F (37 C)   Resp 16   Wt 145 lb 9.6 oz (66 kg)   SpO2 98%   BMI 25.79 kg/m   Physical Exam  General appearance - alert, well appearing, and in no distress Mental status - normal mood, behavior, speech, dress, motor activity, and thought processes Neck - supple, no significant adenopathy Chest - clear to auscultation, no wheezes, rales or rhonchi, symmetric air entry Heart - normal rate, regular rhythm, normal S1, S2, no murmurs, rubs, clicks or gallops Extremities - peripheral pulses normal, no pedal edema, no clubbing or cyanosis MSK: Right shoulder: No point tenderness on palpation of the joint.  Good range of motion.  She has some discomfort with elevation above 90 degrees.  Drop arm test is negative.  CMP Latest Ref Rng & Units 09/20/2019 09/04/2019 07/03/2018  Glucose 65 - 99 mg/dL 151(H) 215(H) 133(H)  BUN 8 - 27 mg/dL _0 Creatinine 0.57 - 1.00 mg/dL 0.97 1.11(H) 0.76  Sodium 134 - 144 mmol/L 140 144 141  Potassium 3.5 - 5.2 mmol/L 4.0 4.7 4.4  Chloride 96 - 106 mmol/L 101 102 102  CO2 20 - 29 mmol/L _1 Calcium 8.7 - 10.3 mg/dL 9.7 9.6 9.9  Total Protein 6.0 - 8.5 g/dL - 7.2 7.4  Total Bilirubin 0.0 - 1.2 mg/dL - 0.2 0.4  Alkaline Phos 48 - 121 IU/L - 76 59  AST 0 - 40 IU/L - 14 16  ALT 0 - 32 IU/L - 13 13   Lipid Panel     Component Value Date/Time   CHOL 158  09/04/2019 1651   TRIG 259 (H) 09/04/2019 1651   HDL 39 (L) 09/04/2019 1651   CHOLHDL 4.1 09/04/2019 1651   CHOLHDL 2.8 07/29/2015 0918   VLDL 16 07/29/2015 0918   LDLCALC 77 09/04/2019 1651   LDLDIRECT 71 12/03/2009 2014    CBC    Component Value Date/Time   WBC 7.8 09/04/2019 1651   WBC 7.0 12/13/2014 1031   RBC 4.82 09/04/2019 1651   RBC 4.78 12/13/2014 1031   HGB 13.1 09/04/2019 1651   HCT 40.2 09/04/2019 1651   PLT 201 09/04/2019 1651   MCV 83 09/04/2019 1651   MCH 27.2 09/04/2019 1651   MCH 26.6 12/13/2014 1031   MCHC 32.6 09/04/2019 1651   MCHC 32.7 12/13/2014 1031   RDW 13.1 09/04/2019 1651   LYMPHSABS 3.9 07/24/2013 1741   MONOABS 0.6 07/24/2013 1741   EOSABS 0.1 07/24/2013 1741   BASOSABS 0.0 07/24/2013 1741    ASSESSMENT AND PLAN:  1. Type 2 diabetes mellitus without complication, with long-term current use of insulin ([place]) Commended her on improvement in her A1c which went from 11 down to 7 today.  Encourage her to continue healthy eating habits.  Continue moving as much as possible.  Continue the Metformin and Lantus insulin. - POCT glucose (manual entry) - POCT glycosylated hemoglobin (Hb A1C) - insulin  glargine (LANTUS SOLOSTAR) 100 UNIT/ML Solostar Pen; Inject 10 Units into the skin daily.  Dispense: 15 mL; Refill: 6  2. Essential hypertension At goal.  Continue lisinopril/HCTZ and metoprolol  3. Hyperlipidemia associated with type 2 diabetes mellitus (HCC) Continue pravastatin  4. Need for influenza vaccination Given today.  5. Chronic right shoulder pain Most likely rotator cuff tendinitis versus arthritis.  I recommend getting an x-ray, starting Tylenol arthritis and referring her to sports medicine specialist.  Patient states she would prefer to try the Tylenol arthritis first and then if no improvement she will be agreeable to getting the x-ray and referral.  Advised her to try to avoid heavy lifting with the right arm for the next several  weeks.  6. Renal insufficiency Improved.  We will continue to monitor.  Avoid long-term use of NSAIDs.   Patient was given the opportunity to ask questions.  Patient verbalized understanding of the plan and was able to repeat key elements of the plan.   Orders Placed This Encounter  Procedures  . POCT glucose (manual entry)  . POCT glycosylated hemoglobin (Hb A1C)     Requested Prescriptions   Signed Prescriptions Disp Refills  . insulin glargine (LANTUS SOLOSTAR) 100 UNIT/ML Solostar Pen 15 mL 6    Sig: Inject 10 Units into the skin daily.    Return in about 4 months (around 04/06/2020).  Karle Plumber, MD, FACP

## 2019-12-06 NOTE — Patient Instructions (Addendum)
Try to avoid heavy lifting with the right arm.  I would purchase and use some Tylenol arthritis 1 tablet 2-3 times a day as needed for the pain.  You can also purchase the anti-inflammatory pain gel from over-the-counter called Voltaren gel and use it 3 times a day on the shoulder.   Please remember to call us with the information concerning the name of the COVID-19 vaccine that she received on the dates that she received it.  Influenza Virus Vaccine injection (Fluarix) What is this medicine? INFLUENZA VIRUS VACCINE (in floo EN zuh VAHY ruhs vak SEEN) helps to reduce the risk of getting influenza also known as the flu. This medicine may be used for other purposes; ask your health care provider or pharmacist if you have questions. COMMON BRAND NAME(S): Fluarix, Fluzone What should I tell my health care provider before I take this medicine? They need to know if you have any of these conditions:  bleeding disorder like hemophilia  fever or infection  Guillain-Barre syndrome or other neurological problems  immune system problems  infection with the human immunodeficiency virus (HIV) or AIDS  low blood platelet counts  multiple sclerosis  an unusual or allergic reaction to influenza virus vaccine, eggs, chicken proteins, latex, gentamicin, other medicines, foods, dyes or preservatives  pregnant or trying to get pregnant  breast-feeding How should I use this medicine? This vaccine is for injection into a muscle. It is given by a health care professional. A copy of Vaccine Information Statements will be given before each vaccination. Read this sheet carefully each time. The sheet may change frequently. Talk to your pediatrician regarding the use of this medicine in children. Special care may be needed. Overdosage: If you think you have taken too much of this medicine contact a poison control center or emergency room at once. NOTE: This medicine is only for you. Do not share this medicine  with others. What if I miss a dose? This does not apply. What may interact with this medicine?  chemotherapy or radiation therapy  medicines that lower your immune system like etanercept, anakinra, infliximab, and adalimumab  medicines that treat or prevent blood clots like warfarin  phenytoin  steroid medicines like prednisone or cortisone  theophylline  vaccines This list may not describe all possible interactions. Give your health care provider a list of all the medicines, herbs, non-prescription drugs, or dietary supplements you use. Also tell them if you smoke, drink alcohol, or use illegal drugs. Some items may interact with your medicine. What should I watch for while using this medicine? Report any side effects that do not go away within 3 days to your doctor or health care professional. Call your health care provider if any unusual symptoms occur within 6 weeks of receiving this vaccine. You may still catch the flu, but the illness is not usually as bad. You cannot get the flu from the vaccine. The vaccine will not protect against colds or other illnesses that may cause fever. The vaccine is needed every year. What side effects may I notice from receiving this medicine? Side effects that you should report to your doctor or health care professional as soon as possible:  allergic reactions like skin rash, itching or hives, swelling of the face, lips, or tongue Side effects that usually do not require medical attention (report to your doctor or health care professional if they continue or are bothersome):  fever  headache  muscle aches and pains  pain, tenderness, redness, or swelling  at site where injected  weak or tired This list may not describe all possible side effects. Call your doctor for medical advice about side effects. You may report side effects to FDA at 1-800-FDA-1088. Where should I keep my medicine? This vaccine is only given in a clinic, pharmacy,  doctor's office, or other health care setting and will not be stored at home. NOTE: This sheet is a summary. It may not cover all possible information. If you have questions about this medicine, talk to your doctor, pharmacist, or health care provider.  2020 Elsevier/Gold Standard (2007-11-01 09:30:40)

## 2020-01-16 ENCOUNTER — Other Ambulatory Visit: Payer: Self-pay | Admitting: Internal Medicine

## 2020-01-16 DIAGNOSIS — I10 Essential (primary) hypertension: Secondary | ICD-10-CM

## 2020-01-16 NOTE — Telephone Encounter (Signed)
Requested Prescriptions  Pending Prescriptions Disp Refills  . lisinopril-hydrochlorothiazide (ZESTORETIC) 20-12.5 MG tablet [Pharmacy Med Name: LISINOPRIL-HCTZ 20/12.5MG  TABLETS] 90 tablet 1    Sig: TAKE 1 TABLET BY MOUTH EVERY DAY     Cardiovascular:  ACEI + Diuretic Combos Passed - 01/16/2020 10:50 AM      Passed - Na in normal range and within 180 days    Sodium  Date Value Ref Range Status  09/20/2019 140 134 - 144 mmol/L Final         Passed - K in normal range and within 180 days    Potassium  Date Value Ref Range Status  09/20/2019 4.0 3.5 - 5.2 mmol/L Final         Passed - Cr in normal range and within 180 days    Creat  Date Value Ref Range Status  07/29/2015 0.75 0.50 - 0.99 mg/dL Final   Creatinine, Ser  Date Value Ref Range Status  09/20/2019 0.97 0.57 - 1.00 mg/dL Final   Creatinine, POC  Date Value Ref Range Status  12/09/2017 50 mg/dL Final   Creatinine, Urine  Date Value Ref Range Status  11/16/2012 110.4 mg/dL Final         Passed - Ca in normal range and within 180 days    Calcium  Date Value Ref Range Status  09/20/2019 9.7 8.7 - 10.3 mg/dL Final         Passed - Patient is not pregnant      Passed - Last BP in normal range    BP Readings from Last 1 Encounters:  12/06/19 122/76         Passed - Valid encounter within last 6 months    Recent Outpatient Visits          1 month ago Need for influenza vaccination   [place], Annie Main L, RPH-CPP   1 month ago Type 2 diabetes mellitus without complication, with long-term current use of insulin ([place])   [place], Neoma Laming B, MD   4 months ago Type 2 diabetes mellitus with hyperglycemia, without long-term current use of insulin [facility])   [place] Ladell Pier, MD   11 months ago Need for influenza vaccination   [place], Annie Main L,  RPH-CPP   11 months ago Type 2 diabetes mellitus without complication, without long-term current use of insulin [facility])   [place] [facility] Ladell Pier, MD

## 2020-02-22 ENCOUNTER — Other Ambulatory Visit: Payer: Self-pay | Admitting: Internal Medicine

## 2020-02-22 DIAGNOSIS — E119 Type 2 diabetes mellitus without complications: Secondary | ICD-10-CM

## 2020-02-26 DIAGNOSIS — H0102A Squamous blepharitis right eye, upper and lower eyelids: Secondary | ICD-10-CM | POA: Diagnosis not present

## 2020-02-26 DIAGNOSIS — E119 Type 2 diabetes mellitus without complications: Secondary | ICD-10-CM | POA: Diagnosis not present

## 2020-02-26 DIAGNOSIS — H35033 Hypertensive retinopathy, bilateral: Secondary | ICD-10-CM | POA: Diagnosis not present

## 2020-02-26 DIAGNOSIS — H5203 Hypermetropia, bilateral: Secondary | ICD-10-CM | POA: Diagnosis not present

## 2020-02-26 DIAGNOSIS — H25813 Combined forms of age-related cataract, bilateral: Secondary | ICD-10-CM | POA: Diagnosis not present

## 2020-02-26 DIAGNOSIS — H04123 Dry eye syndrome of bilateral lacrimal glands: Secondary | ICD-10-CM | POA: Diagnosis not present

## 2020-02-27 ENCOUNTER — Other Ambulatory Visit: Payer: Self-pay | Admitting: Internal Medicine

## 2020-02-27 DIAGNOSIS — Z1231 Encounter for screening mammogram for malignant neoplasm of breast: Secondary | ICD-10-CM

## 2020-04-09 ENCOUNTER — Other Ambulatory Visit: Payer: Self-pay | Admitting: Internal Medicine

## 2020-04-09 ENCOUNTER — Ambulatory Visit
Admission: RE | Admit: 2020-04-09 | Discharge: 2020-04-09 | Disposition: A | Discharge status: Home / Self Care | Payer: Medicare Other | Source: Ambulatory Visit | Attending: Internal Medicine | Admitting: Internal Medicine

## 2020-04-09 ENCOUNTER — Other Ambulatory Visit: Payer: Self-pay

## 2020-04-09 DIAGNOSIS — Z1231 Encounter for screening mammogram for malignant neoplasm of breast: Secondary | ICD-10-CM

## 2020-04-09 DIAGNOSIS — Z794 Long term (current) use of insulin: Secondary | ICD-10-CM

## 2020-04-09 NOTE — Telephone Encounter (Signed)
Requested Prescriptions  Pending Prescriptions Disp Refills  . ONETOUCH VERIO test strip [employer] Med Name: ONE TOUCH VERIO TEST ST(NEW)100S] 100 strip 3    Sig: [place] A DAY     Endocrinology: Diabetes - Testing Supplies Passed - 04/09/2020  3:20 PM      Passed - Valid encounter within last 12 months    Recent Outpatient Visits          4 months ago Need for influenza vaccination   [place], Annie Main L, RPH-CPP   4 months ago Type 2 diabetes mellitus without complication, with long-term current use of insulin ([place])   [place], Neoma Laming B, MD   7 months ago Type 2 diabetes mellitus with hyperglycemia, without long-term current use of insulin [facility])   [place] Ladell Pier, MD   1 year ago Need for influenza vaccination   [place], Stephen L, RPH-CPP   1 year ago Type 2 diabetes mellitus without complication, without long-term current use of insulin [facility])   [place], MD      Future Appointments            In 3 weeks Wyn[place] Emery Dalbert Batman, MD [place]

## 2020-04-14 NOTE — Progress Notes (Signed)
Let pt know that MMG is okay.

## 2020-04-16 ENCOUNTER — Telehealth: Payer: Self-pay

## 2020-04-16 NOTE — Telephone Encounter (Signed)
Contacted pt to go over MM results pt is aware and doesn't have any questions or concerns

## 2020-05-02 ENCOUNTER — Ambulatory Visit: Payer: Medicare Other | Admitting: Internal Medicine

## 2020-06-13 ENCOUNTER — Ambulatory Visit: Payer: Medicare Other | Admitting: Internal Medicine

## 2020-07-14 ENCOUNTER — Other Ambulatory Visit: Payer: Self-pay | Admitting: Internal Medicine

## 2020-07-14 DIAGNOSIS — I10 Essential (primary) hypertension: Secondary | ICD-10-CM

## 2020-07-14 NOTE — Telephone Encounter (Signed)
Requested Prescriptions  Pending Prescriptions Disp Refills  . metoprolol succinate (TOPROL-XL) 50 MG 24 hr tablet [Pharmacy Med Name: METOPROLOL ER SUCCINATE 50MG  TABS] 45 tablet 0    Sig: TAKE 1 AND 1/2 TABLETS(75 MG) BY MOUTH DAILY     Cardiovascular:  Beta Blockers Failed - 07/14/2020 10:11 AM      Failed - Valid encounter within last 6 months    Recent Outpatient Visits          7 months ago Need for influenza vaccination   [place], Annie Main L, RPH-CPP   7 months ago Type 2 diabetes mellitus without complication, with long-term current use of insulin ([place])   [place], Neoma Laming B, MD   10 months ago Type 2 diabetes mellitus with hyperglycemia, without long-term current use of insulin [facility])   [place] Ladell Pier, MD   1 year ago Need for influenza vaccination   [place], Stephen L, RPH-CPP   1 year ago Type 2 diabetes mellitus without complication, without long-term current use of insulin [facility])   [place], MD      Future Appointments            In 2 weeks Ladell Pier, MD [place] BP in normal range    BP Readings from Last 1 Encounters:  12/06/19 122/76         Passed - Last Heart Rate in normal range    Pulse Readings from Last 1 Encounters:  12/06/19 76

## 2020-07-14 NOTE — Telephone Encounter (Signed)
Requested Prescriptions  Pending Prescriptions Disp Refills  . metoprolol succinate (TOPROL-XL) 50 MG 24 hr tablet [Pharmacy Med Name: METOPROLOL ER SUCCINATE 50MG  TABS] 45 tablet 0    Sig: TAKE 1 AND 1/2 TABLETS(75 MG) BY MOUTH DAILY     Cardiovascular:  Beta Blockers Failed - 07/14/2020 10:11 AM      Failed - Valid encounter within last 6 months    Recent Outpatient Visits          7 months ago Need for influenza vaccination   [place], Annie Main L, RPH-CPP   7 months ago Type 2 diabetes mellitus without complication, with long-term current use of insulin ([place])   [place], Neoma Laming B, MD   10 months ago Type 2 diabetes mellitus with hyperglycemia, without long-term current use of insulin [facility])   [place] Ladell Pier, MD   1 year ago Need for influenza vaccination   [place], Stephen L, RPH-CPP   1 year ago Type 2 diabetes mellitus without complication, without long-term current use of insulin [facility])   [place], MD      Future Appointments            In 2 weeks Ladell Pier, MD [place] BP in normal range    BP Readings from Last 1 Encounters:  12/06/19 122/76         Passed - Last Heart Rate in normal range    Pulse Readings from Last 1 Encounters:  12/06/19 76

## 2020-07-27 ENCOUNTER — Other Ambulatory Visit: Payer: Self-pay | Admitting: Internal Medicine

## 2020-07-27 DIAGNOSIS — I10 Essential (primary) hypertension: Secondary | ICD-10-CM

## 2020-07-27 NOTE — Telephone Encounter (Signed)
Requested Prescriptions  Pending Prescriptions Disp Refills  . lisinopril-hydrochlorothiazide (ZESTORETIC) 20-12.5 MG tablet [Pharmacy Med Name: LISINOPRIL-HCTZ 20/12.5MG  TABLETS] 90 tablet 0    Sig: TAKE 1 TABLET BY MOUTH EVERY DAY     Cardiovascular:  ACEI + Diuretic Combos Failed - 07/27/2020 11:08 AM      Failed - Na in normal range and within 180 days    Sodium  Date Value Ref Range Status  09/20/2019 140 134 - 144 mmol/L Final         Failed - K in normal range and within 180 days    Potassium  Date Value Ref Range Status  09/20/2019 4.0 3.5 - 5.2 mmol/L Final         Failed - Cr in normal range and within 180 days    Creat  Date Value Ref Range Status  07/29/2015 0.75 0.50 - 0.99 mg/dL Final   Creatinine, Ser  Date Value Ref Range Status  09/20/2019 0.97 0.57 - 1.00 mg/dL Final   Creatinine, POC  Date Value Ref Range Status  12/09/2017 50 mg/dL Final   Creatinine, Urine  Date Value Ref Range Status  11/16/2012 110.4 mg/dL Final         Failed - Ca in normal range and within 180 days    Calcium  Date Value Ref Range Status  09/20/2019 9.7 8.7 - 10.3 mg/dL Final         Failed - Valid encounter within last 6 months    Recent Outpatient Visits          7 months ago Need for influenza vaccination   [place], Annie Main L, RPH-CPP   7 months ago Type 2 diabetes mellitus without complication, with long-term current use of insulin ([place])   [place] Karle Plumber B, MD   10 months ago Type 2 diabetes mellitus with hyperglycemia, without long-term current use of insulin ([place])   [place] Ladell Pier, MD   1 year ago Need for influenza vaccination   [place], Stephen L, RPH-CPP   1 year ago Type 2 diabetes mellitus without complication, without long-term current use of insulin [facility])   [place], MD      Future Appointments            In 4 days Ladell Pier, MD [place] - Patient is not pregnant      Passed - Last BP in normal range    BP Readings from Last 1 Encounters:  12/06/19 122/76

## 2020-07-31 ENCOUNTER — Ambulatory Visit: Payer: Medicare Other | Admitting: Internal Medicine

## 2020-08-08 ENCOUNTER — Other Ambulatory Visit: Payer: Self-pay | Admitting: Internal Medicine

## 2020-08-08 DIAGNOSIS — E1165 Type 2 diabetes mellitus with hyperglycemia: Secondary | ICD-10-CM

## 2020-08-08 NOTE — Telephone Encounter (Signed)
Courtesy refill. Future appt. In 1 month

## 2020-08-08 NOTE — Telephone Encounter (Signed)
Requested medication (s) are due for refill today - overdue  Requested medication (s) are on the active medication list -yes  Future visit scheduled -yes  Last refill: 04/20/20  Notes to clinic: ? Medication compliance- patient has cancel last 3 appointments. Request sent for PCP review   Requested Prescriptions  Pending Prescriptions Disp Refills   metFORMIN (GLUCOPHAGE) 1000 MG tablet [Pharmacy Med Name: METFORMIN 1000MG TABLETS] 60 tablet 6    Sig: TAKE 1 TABLET(1000 MG) BY MOUTH TWICE DAILY WITH A MEAL      Endocrinology:  Diabetes - Biguanides Failed - 08/08/2020  4:08 PM      Failed - HBA1C is between 0 and 7.9 and within 180 days    HbA1c, POC (prediabetic range)  Date Value Ref Range Status  12/09/2017 6.2 5.7 - 6.4 % Final   HbA1c, POC (controlled diabetic range)  Date Value Ref Range Status  12/06/2019 7.0 0.0 - 7.0 % Final          Failed - Valid encounter within last 6 months    Recent Outpatient Visits           8 months ago Need for influenza vaccination   [place], Annie Main L, RPH-CPP   8 months ago Type 2 diabetes mellitus without complication, with long-term current use of insulin ([place])   [place] Karle Plumber B, MD   11 months ago Type 2 diabetes mellitus with hyperglycemia, without long-term current use of insulin ([place])   [place] Ladell Pier, MD   1 year ago Need for influenza vaccination   [place], Stephen L, RPH-CPP   1 year ago Type 2 diabetes mellitus without complication, without long-term current use of insulin ([place])   [place], MD       Future Appointments             In 1 month Ladell Pier, MD [place] in normal range and within 360 days    Creat  Date Value  Ref Range Status  07/29/2015 0.75 0.50 - 0.99 mg/dL Final   Creatinine, Ser  Date Value Ref Range Status  09/20/2019 0.97 0.57 - 1.00 mg/dL Final   Creatinine, POC  Date Value Ref Range Status  12/09/2017 50 mg/dL Final   Creatinine, Urine  Date Value Ref Range Status  11/16/2012 110.4 mg/dL Final          Passed - AA eGFR in normal range and within 360 days    GFR, Est African American  Date Value Ref Range Status  12/13/2014 71 >=60 mL/min Final   GFR calc Af Amer  Date Value Ref Range Status  09/20/2019 68 >59 mL/min/1.73 Final    Comment:    **[facility] currently reports eGFR in compliance with the current**   recommendations of the [employer]. [facility] will   update reporting as new guidelines are published from the NKF-ASN   Task force.    GFR, Est Non African American  Date Value Ref Range Status  12/13/2014 61 >=60 mL/min Final    Comment:      The estimated GFR is a calculation valid for adults (>=76 years old) that uses the CKD-EPI algorithm to adjust for age and  sex. It is   not to be used for children, pregnant women, hospitalized patients,    patients on dialysis, or with rapidly changing kidney function. According to the NKDEP, eGFR >89 is normal, 60-89 shows mild impairment, 30-59 shows moderate impairment, 15-29 shows severe impairment and <15 is ESRD.      GFR calc non Af Amer  Date Value Ref Range Status  09/20/2019 59 (L) >59 mL/min/1.73 Final              Requested Prescriptions  Pending Prescriptions Disp Refills   metFORMIN (GLUCOPHAGE) 1000 MG tablet [Pharmacy Med Name: METFORMIN 1000MG TABLETS] 60 tablet 6    Sig: TAKE 1 TABLET(1000 MG) BY MOUTH TWICE DAILY WITH A MEAL      Endocrinology:  Diabetes - Biguanides Failed - 08/08/2020  4:08 PM      Failed - HBA1C is between 0 and 7.9 and within 180 days    HbA1c, POC (prediabetic range)  Date Value Ref Range Status  12/09/2017 6.2 5.7 - 6.4 % Final   HbA1c, POC  (controlled diabetic range)  Date Value Ref Range Status  12/06/2019 7.0 0.0 - 7.0 % Final          Failed - Valid encounter within last 6 months    Recent Outpatient Visits           8 months ago Need for influenza vaccination   [place], Annie Main L, RPH-CPP   8 months ago Type 2 diabetes mellitus without complication, with long-term current use of insulin ([place])   [place] Karle Plumber B, MD   11 months ago Type 2 diabetes mellitus with hyperglycemia, without long-term current use of insulin ([place])   [place] Ladell Pier, MD   1 year ago Need for influenza vaccination   [place], Stephen L, RPH-CPP   1 year ago Type 2 diabetes mellitus without complication, without long-term current use of insulin ([place])   [place], MD       Future Appointments             In 1 month Ladell Pier, MD [place] in normal range and within 360 days    Creat  Date Value Ref Range Status  07/29/2015 0.75 0.50 - 0.99 mg/dL Final   Creatinine, Ser  Date Value Ref Range Status  09/20/2019 0.97 0.57 - 1.00 mg/dL Final   Creatinine, POC  Date Value Ref Range Status  12/09/2017 50 mg/dL Final   Creatinine, Urine  Date Value Ref Range Status  11/16/2012 110.4 mg/dL Final          Passed - AA eGFR in normal range and within 360 days    GFR, Est African American  Date Value Ref Range Status  12/13/2014 71 >=60 mL/min Final   GFR calc Af Amer  Date Value Ref Range Status  09/20/2019 68 >59 mL/min/1.73 Final    Comment:    **[facility] currently reports eGFR in compliance with the current**   recommendations of the [employer]. [facility] will   update reporting as new guidelines are published from the  NKF-ASN   Task force.    GFR, Est Non African American  Date Value Ref Range  Status  12/13/2014 61 >=60 mL/min Final    Comment:      The estimated GFR is a calculation valid for adults (>=70 years old) that uses the CKD-EPI algorithm to adjust for age and sex. It is   not to be used for children, pregnant women, hospitalized patients,    patients on dialysis, or with rapidly changing kidney function. According to the NKDEP, eGFR >89 is normal, 60-89 shows mild impairment, 30-59 shows moderate impairment, 15-29 shows severe impairment and <15 is ESRD.      GFR calc non Af Amer  Date Value Ref Range Status  09/20/2019 59 (L) >59 mL/min/1.73 Final

## 2020-08-18 ENCOUNTER — Other Ambulatory Visit: Payer: Self-pay | Admitting: Internal Medicine

## 2020-08-18 DIAGNOSIS — I10 Essential (primary) hypertension: Secondary | ICD-10-CM

## 2020-08-18 NOTE — Telephone Encounter (Signed)
  Notes to clinic:  Appointment was canceled on 4/14//2022 and now has appt 10/02/2020 Review for refill until that time    Requested Prescriptions  Pending Prescriptions Disp Refills   metoprolol succinate (TOPROL-XL) 50 MG 24 hr tablet [Pharmacy Med Name: METOPROLOL ER SUCCINATE 50MG  TABS] 45 tablet 0    Sig: TAKE 1 AND 1/2 TABLETS(75 MG) BY MOUTH DAILY      Cardiovascular:  Beta Blockers Failed - 08/18/2020 10:12 AM      Failed - Valid encounter within last 6 months    Recent Outpatient Visits           8 months ago Need for influenza vaccination   [place], Annie Main L, RPH-CPP   8 months ago Type 2 diabetes mellitus without complication, with long-term current use of insulin ([place])   [place] Karle Plumber B, MD   11 months ago Type 2 diabetes mellitus with hyperglycemia, without long-term current use of insulin ([place])   [place] Ladell Pier, MD   1 year ago Need for influenza vaccination   [place], Stephen L, RPH-CPP   1 year ago Type 2 diabetes mellitus without complication, without long-term current use of insulin [facility])   [place], MD       Future Appointments             In 1 month Ladell Pier, MD [place] BP in normal range    BP Readings from Last 1 Encounters:  12/06/19 122/76          Passed - Last Heart Rate in normal range    Pulse Readings from Last 1 Encounters:  12/06/19 76

## 2020-09-29 ENCOUNTER — Ambulatory Visit: Payer: Medicare Other | Attending: Internal Medicine | Admitting: Internal Medicine

## 2020-09-29 ENCOUNTER — Encounter: Payer: Self-pay | Admitting: Internal Medicine

## 2020-09-29 ENCOUNTER — Other Ambulatory Visit: Payer: Self-pay

## 2020-09-29 VITALS — BP 115/76 | HR 79 | Resp 16 | Wt 140.4 lb

## 2020-09-29 DIAGNOSIS — Z9229 Personal history of other drug therapy: Secondary | ICD-10-CM

## 2020-09-29 DIAGNOSIS — Z794 Long term (current) use of insulin: Secondary | ICD-10-CM

## 2020-09-29 DIAGNOSIS — E119 Type 2 diabetes mellitus without complications: Secondary | ICD-10-CM

## 2020-09-29 DIAGNOSIS — E1169 Type 2 diabetes mellitus with other specified complication: Secondary | ICD-10-CM

## 2020-09-29 DIAGNOSIS — E1159 Type 2 diabetes mellitus with other circulatory complications: Secondary | ICD-10-CM | POA: Diagnosis not present

## 2020-09-29 DIAGNOSIS — F17220 Nicotine dependence, chewing tobacco, uncomplicated: Secondary | ICD-10-CM

## 2020-09-29 DIAGNOSIS — I152 Hypertension secondary to endocrine disorders: Secondary | ICD-10-CM

## 2020-09-29 DIAGNOSIS — Z720 Tobacco use: Secondary | ICD-10-CM

## 2020-09-29 DIAGNOSIS — E785 Hyperlipidemia, unspecified: Secondary | ICD-10-CM

## 2020-09-29 LAB — POCT GLYCOSYLATED HEMOGLOBIN (HGB A1C): HbA1c, POC (controlled diabetic range): 7.1 % — AB (ref 0.0–7.0)

## 2020-09-29 LAB — GLUCOSE, POCT (MANUAL RESULT ENTRY): POC Glucose: 107 mg/dl — AB (ref 70–99)

## 2020-09-29 MED ORDER — METOPROLOL SUCCINATE ER 50 MG PO TB24
ORAL_TABLET | ORAL | 1 refills | Status: DC
Start: 2020-09-29 — End: 2021-07-17

## 2020-09-29 MED ORDER — LISINOPRIL-HYDROCHLOROTHIAZIDE 20-12.5 MG PO TABS: 1.0000 | ORAL_TABLET | Freq: Every day | ORAL | 1 refills | Status: DC

## 2020-09-29 MED ORDER — PRAVASTATIN SODIUM 20 MG PO TABS: ORAL_TABLET | ORAL | 3 refills | Status: DC

## 2020-09-29 MED ORDER — METFORMIN HCL 1000 MG PO TABS: ORAL_TABLET | ORAL | 1 refills | Status: DC

## 2020-09-29 NOTE — Progress Notes (Signed)
Patient ID: Cindy Taylor, female    DOB: [birth year]  MRN: [id number]  CC: Diabetes   Subjective: Cindy Taylor is a 75 y.o. female who presents for chronic ds management Her concerns today include:  Pt with DM, HTN, HL, Hep C treated with Harvoni, snuff user, CKD 2-3  DIABETES TYPE 2 Last A1C:   Results for orders placed or performed in visit on 09/29/20  POCT glucose (manual entry)  Result Value Ref Range   POC Glucose 107 (A) 70 - 99 mg/dl  POCT glycosylated hemoglobin (Hb A1C)  Result Value Ref Range   Hemoglobin A1C     HbA1c POC (<> result, manual entry)     HbA1c, POC (prediabetic range)     HbA1c, POC (controlled diabetic range) 7.1 (A) 0.0 - 7.0 %    Med Adherence:  '[x]'  Yes.  She is on metformin and Lantus insulin 10 units.   '[]'  No Medication side effects:  '[]'  Yes    '[x]'  No Home Monitoring?  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 '[]'  Yes    '[x]'  No Retinopathy hx? '[]'  Yes    '[x]'  No Last eye exam: 02/2020 by Dr Schuyler Amor.   Comments:   HL:  taking and tolerating Pravachol.  HYPERTENSION Currently taking: see medication list.  She confirms that she is taking lisinopril/HCTZ and metoprolol Med Adherence: '[x]'  Yes    '[]'  No Medication side effects: '[]'  Yes    '[x]'  No Adherence with salt restriction: '[x]'  Yes    '[]'  No Home Monitoring?: '[]'  Yes    '[x]'  No Monitoring Frequency: '[]'  Yes    '[]'  No Home BP results range: '[]'  Yes    '[]'  No SOB? '[]'  Yes    '[x]'  No Chest Pain?: '[]'  Yes    '[x]'  No Leg swelling?: '[]'  Yes    '[x]'  No Headaches?: '[]'  Yes    '[x]'  No Dizziness? '[]'  Yes    '[x]'  No Comments:   Still chews snuff tobaco but states not as much as before.   HM:  COVID vaccine - reports she had all 3 Moderna, needs shingles vaccine but we are out.  Patient Active Problem List   Diagnosis Date Noted   Unintentional  weight loss 10/22/2016   Chews tobacco 01/13/2016   Onychomycosis of multiple toenails with type 2 diabetes mellitus ([place]) 01/13/2016   History of hepatitis C 05/23/2014   DM type 2 (diabetes mellitus, type 2) ([place]) 11/16/2012   Hyperlipidemia associated with type 2 diabetes mellitus ([place]) 11/16/2012   Essential hypertension, benign 12/12/2008     Current Outpatient Medications on File Prior to Visit  Medication Sig Dispense Refill   aspirin 81 MG tablet Take 1 tablet (81 mg total) by mouth daily. 90 tablet 3   Blood Glucose Monitoring Suppl (ONETOUCH VERIO) w/Device KIT Check blood sugars once daily 1 kit 0   Calcium Citrate-Vitamin D 250-200 MG-UNIT TABS Take 1 tablet by mouth 2 (two) times daily with a meal.     cetirizine-pseudoephedrine (ZYRTEC-D) 5-120 MG tablet Take 1 tablet by mouth daily. 30 tablet 0   fexofenadine (ALLEGRA ALLERGY) 60 MG tablet Take 1 tablet (60 mg total) by mouth 2 (two) times daily. 60 tablet 3   fluticasone (FLONASE) 50 MCG/ACT nasal spray Place 2 sprays into both nostrils  daily. 16 g 6   insulin glargine (LANTUS SOLOSTAR) 100 UNIT/ML Solostar Pen Inject 10 Units into the skin daily. 15 mL 6   Insulin Pen Needle (PEN NEEDLES) 31G X 8 MM MISC UAD 100 each 6   lidocaine (XYLOCAINE) 2 % solution Use as directed 15 mLs in the mouth or throat as needed for mouth pain. 100 mL 0   menthol-cetylpyridinium (CEPACOL) 3 MG lozenge Take 1 lozenge (3 mg total) by mouth as needed for sore throat. 100 tablet 0   omeprazole (PRILOSEC) 20 MG capsule TAKE 1 CAPSULE(20 MG) BY MOUTH DAILY 30 capsule 0   OneTouch Delica Lancets 48N MISC Check blood sugar fasting and before meals and again if pt feels bad (symptoms of hypo). 100 each 12   ONETOUCH VERIO test strip CHECK BLOOD SUGARS ONCE A DAY 100 strip 3   No current facility-administered medications on file prior to visit.    No Known Allergies  Social History   Socioeconomic History   Marital status: Single    Spouse  name: Not on file   Number of children: Not on file   Years of education: Not on file   Highest education level: Not on file  Occupational History   Not on file  Tobacco Use   Smoking status: Never   Smokeless tobacco: Current    Types: Snuff  Substance and Sexual Activity   Alcohol use: No    Comment: glass of wine at special occasion   Drug use: No   Sexual activity: Not Currently  Other Topics Concern   Not on file  Social History Narrative   Not on file   Social Determinants of Health   Financial Resource Strain: Not on file  Food Insecurity: Not on file  Transportation Needs: Not on file  Physical Activity: Not on file  Stress: Not on file  Social Connections: Not on file  Intimate Partner Violence: Not on file    Family History  Problem Relation Age of Onset   Stroke Mother    Rectal cancer Mother    Colon polyps Neg Hx    Colon cancer Neg Hx    Esophageal cancer Neg Hx    Stomach cancer Neg Hx     Past Surgical History:  Procedure Laterality Date   TOTAL ABDOMINAL HYSTERECTOMY     TUBAL LIGATION      ROS: Review of Systems Negative except as stated above  PHYSICAL EXAM: BP 115/76   Pulse 79   Resp 16   Wt 140 lb 6.4 oz (63.7 kg)   SpO2 97%   BMI 24.87 kg/m   Physical Exam  General appearance - alert, well appearing, and in no distress Mental status - normal mood, behavior, speech, dress, motor activity, and thought processes Mouth - mucous membranes moist, pharynx normal without lesions Neck - supple, no significant adenopathy Chest - clear to auscultation, no wheezes, rales or rhonchi, symmetric air entry Heart - normal rate, regular rhythm, normal S1, S2, no murmurs, rubs, clicks or gallops Extremities - peripheral pulses normal, no pedal edema, no clubbing or cyanosis Diabetic Foot Exam - Simple   Simple Foot Form Visual Inspection See comments: Yes Sensation Testing Intact to touch and monofilament testing bilaterally: Yes Pulse  Check Posterior Tibialis and Dorsalis pulse intact bilaterally: Yes Comments Toenails are thick but not overgrown.     Foot ok. Nails thick CMP Latest Ref Rng & Units 09/20/2019 09/04/2019 07/03/2018  Glucose 65 - 99 mg/dL 151(H) 215(H)  133(H)  BUN 8 - 27 mg/dL '12 11 14  ' Creatinine 0.57 - 1.00 mg/dL 0.97 1.11(H) 0.76  Sodium 134 - 144 mmol/L 140 144 141  Potassium 3.5 - 5.2 mmol/L 4.0 4.7 4.4  Chloride 96 - 106 mmol/L 101 102 102  CO2 20 - 29 mmol/L '26 28 23  ' Calcium 8.7 - 10.3 mg/dL 9.7 9.6 9.9  Total Protein 6.0 - 8.5 g/dL - 7.2 7.4  Total Bilirubin 0.0 - 1.2 mg/dL - 0.2 0.4  Alkaline Phos 48 - 121 IU/L - 76 59  AST 0 - 40 IU/L - 14 16  ALT 0 - 32 IU/L - 13 13   Lipid Panel     Component Value Date/Time   CHOL 158 09/04/2019 1651   TRIG 259 (H) 09/04/2019 1651   HDL 39 (L) 09/04/2019 1651   CHOLHDL 4.1 09/04/2019 1651   CHOLHDL 2.8 07/29/2015 0918   VLDL 16 07/29/2015 0918   LDLCALC 77 09/04/2019 1651   LDLDIRECT 71 12/03/2009 2014    CBC    Component Value Date/Time   WBC 7.8 09/04/2019 1651   WBC 7.0 12/13/2014 1031   RBC 4.82 09/04/2019 1651   RBC 4.78 12/13/2014 1031   HGB 13.1 09/04/2019 1651   HCT 40.2 09/04/2019 1651   PLT 201 09/04/2019 1651   MCV 83 09/04/2019 1651   MCH 27.2 09/04/2019 1651   MCH 26.6 12/13/2014 1031   MCHC 32.6 09/04/2019 1651   MCHC 32.7 12/13/2014 1031   RDW 13.1 09/04/2019 1651   LYMPHSABS 3.9 07/24/2013 1741   MONOABS 0.6 07/24/2013 1741   EOSABS 0.1 07/24/2013 1741   BASOSABS 0.0 07/24/2013 1741    ASSESSMENT AND PLAN:  1. Type 2 diabetes mellitus without complication, with long-term current use of insulin (HCC) Close to goal.  Continue current dose of Lantus and metformin.  Continue healthy eating habits and trying to move as much as possible. - POCT glucose (manual entry) - POCT glycosylated hemoglobin (Hb A1C) - CBC - Comprehensive metabolic panel - Lipid panel - Microalbumin / creatinine urine ratio - metFORMIN  (GLUCOPHAGE) 1000 MG tablet; TAKE 1 TABLET(1000 MG) BY MOUTH TWICE DAILY WITH A MEAL  Dispense: 180 tablet; Refill: 1  2. Hyperlipidemia associated with type 2 diabetes mellitus (HCC) Continue pravastatin.  Due for lipid profile today. - pravastatin (PRAVACHOL) 20 MG tablet; TAKE 1 TABLET(20 MG) BY MOUTH DAILY  Dispense: 90 tablet; Refill: 3  3. Hypertension associated with diabetes ([place]) At goal.  Continue current medications and low-salt diet. - lisinopril-hydrochlorothiazide (ZESTORETIC) 20-12.5 MG tablet; Take 1 tablet by mouth daily.  Dispense: 90 tablet; Refill: 1 - metoprolol succinate (TOPROL-XL) 50 MG 24 hr tablet; TAKE 1 AND 1/2 TABLETS(75 MG) BY MOUTH DAILY  Dispense: 135 tablet; Refill: 1  4. Chews tobacco Encouraged her to discontinue tobacco products.  5.  COVID-19 vaccine series completed. Request that she brings her card with her next visit for [place] to update this in the system.    Patient was given the opportunity to ask questions.  Patient verbalized understanding of the plan and was able to repeat key elements of the plan.   Orders Placed This Encounter  Procedures   CBC   Comprehensive metabolic panel   Lipid panel   Microalbumin / creatinine urine ratio   POCT glucose (manual entry)   POCT glycosylated hemoglobin (Hb A1C)     Requested Prescriptions   Signed Prescriptions Disp Refills   pravastatin (PRAVACHOL) 20 MG tablet 90 tablet 3  Sig: TAKE 1 TABLET(20 MG) BY MOUTH DAILY   lisinopril-hydrochlorothiazide (ZESTORETIC) 20-12.5 MG tablet 90 tablet 1    Sig: Take 1 tablet by mouth daily.   metoprolol succinate (TOPROL-XL) 50 MG 24 hr tablet 135 tablet 1    Sig: TAKE 1 AND 1/2 TABLETS(75 MG) BY MOUTH DAILY   metFORMIN (GLUCOPHAGE) 1000 MG tablet 180 tablet 1    Sig: TAKE 1 TABLET(1000 MG) BY MOUTH TWICE DAILY WITH A MEAL    Return in about 4 months (around 01/29/2021) for Give appt with Lurena Joiner in 1 mth for [employer].  Karle Plumber, MD,  FACP

## 2020-09-30 ENCOUNTER — Other Ambulatory Visit: Payer: Self-pay | Admitting: Internal Medicine

## 2020-09-30 ENCOUNTER — Encounter: Payer: Self-pay | Admitting: Internal Medicine

## 2020-09-30 DIAGNOSIS — E785 Hyperlipidemia, unspecified: Secondary | ICD-10-CM

## 2020-09-30 DIAGNOSIS — R809 Proteinuria, unspecified: Secondary | ICD-10-CM | POA: Insufficient documentation

## 2020-09-30 LAB — LIPID PANEL
Chol/HDL Ratio: 2.8 ratio (ref 0.0–4.4)
Cholesterol, Total: 169 mg/dL (ref 100–199)
HDL: 60 mg/dL (ref >39)
LDL Chol Calc (NIH): 92 mg/dL (ref 0–99)
Triglycerides: 96 mg/dL (ref 0–149)
VLDL Cholesterol Cal: 17 mg/dL (ref 5–40)

## 2020-09-30 LAB — MICROALBUMIN / CREATININE URINE RATIO
Creatinine, Urine: 190.2 mg/dL
Microalb/Creat Ratio: 89 mg/g creat — ABNORMAL HIGH (ref 0–29)
Microalbumin, Urine: 168.9 ug/mL

## 2020-09-30 LAB — COMPREHENSIVE METABOLIC PANEL
ALT: 16 IU/L (ref 0–32)
AST: 14 IU/L (ref 0–40)
Albumin/Globulin Ratio: 1.7 (ref 1.2–2.2)
Albumin: 4.8 g/dL — ABNORMAL HIGH (ref 3.7–4.7)
Alkaline Phosphatase: 56 IU/L (ref 44–121)
BUN/Creatinine Ratio: 13 (ref 12–28)
BUN: 16 mg/dL (ref 8–27)
Bilirubin Total: 0.2 mg/dL (ref 0.0–1.2)
CO2: 25 mmol/L (ref 20–29)
Calcium: 9.7 mg/dL (ref 8.7–10.3)
Chloride: 102 mmol/L (ref 96–106)
Creatinine, Ser: 1.27 mg/dL — ABNORMAL HIGH (ref 0.57–1.00)
Globulin, Total: 2.8 g/dL (ref 1.5–4.5)
Glucose: 84 mg/dL (ref 65–99)
Potassium: 4.8 mmol/L (ref 3.5–5.2)
Sodium: 140 mmol/L (ref 134–144)
Total Protein: 7.6 g/dL (ref 6.0–8.5)
eGFR: 45 mL/min/{1.73_m2} — ABNORMAL LOW (ref >59)

## 2020-09-30 MED ORDER — PRAVASTATIN SODIUM 40 MG PO TABS: ORAL_TABLET | ORAL | 6 refills | Status: DC

## 2020-09-30 NOTE — Progress Notes (Signed)
Let patient know that her kidney function is not 100% and has slightly worsened compared to a year ago.  Await results of urine to see if there is any abnormal amounts of protein in the urine.  Avoid taking over-the-counter pain medications.  LDL cholesterol is 92 with goal being less than 70.  I recommend increasing the pravastatin from 20 mg daily to 40 mg daily.  This means she will take 2 of the 20 mg tablets until she runs out.  Updated prescription sent to her pharmacy for the 40 mg tablet.

## 2020-10-29 ENCOUNTER — Encounter: Payer: Medicare Other | Admitting: Pharmacist

## 2020-11-12 ENCOUNTER — Encounter: Payer: Medicare Other | Admitting: Pharmacist

## 2020-11-15 ENCOUNTER — Other Ambulatory Visit: Payer: Self-pay | Admitting: Internal Medicine

## 2020-11-15 DIAGNOSIS — Z794 Long term (current) use of insulin: Secondary | ICD-10-CM

## 2020-11-17 ENCOUNTER — Other Ambulatory Visit: Payer: Self-pay | Admitting: Internal Medicine

## 2020-11-17 ENCOUNTER — Encounter: Payer: Self-pay | Admitting: Pharmacist

## 2020-11-17 ENCOUNTER — Ambulatory Visit: Payer: Medicare Other | Attending: Internal Medicine | Admitting: Pharmacist

## 2020-11-17 ENCOUNTER — Other Ambulatory Visit: Payer: Self-pay

## 2020-11-17 VITALS — BP 122/72 | HR 82 | Temp 98.3°F | Ht 64.0 in | Wt 136.2 lb

## 2020-11-17 DIAGNOSIS — Z0000 Encounter for general adult medical examination without abnormal findings: Secondary | ICD-10-CM

## 2020-11-17 DIAGNOSIS — E1165 Type 2 diabetes mellitus with hyperglycemia: Secondary | ICD-10-CM

## 2020-11-17 NOTE — Telephone Encounter (Signed)
Requested medications are due for refill today.  yes  Requested medications are on the active medications list. yes  Last refill. 09/04/2019  Future visit scheduled.   yes  Notes to clinic.  Prescription is expired.

## 2020-11-17 NOTE — Progress Notes (Signed)
Subjective:   Cindy Taylor is a 75 y.o. female who presents for Medicare Annual (Subsequent) preventive examination.  Objective:    Today's Vitals   11/17/20 1640 11/17/20 1641  BP: 122/72   Pulse: 82   Temp: 98.3 F (36.8 C)   SpO2: 100%   Weight: 136 lb 3.2 oz (61.8 kg)   Height: '5\' 4"'  (1.626 m)   PainSc: 0-No pain 0-No pain   Body mass index is 23.38 kg/m.  Advanced Directives 11/17/2020 05/20/2017 03/01/2017 01/24/2017 10/22/2016 01/13/2016 07/22/2015  Does Patient Have a Medical Advance Directive? No No No No No No No  Would patient like information on creating a medical advance directive? No - Patient declined - - No - Patient declined - No - patient declined information No - patient declined information    Current Medications (verified) Outpatient Encounter Medications as of 11/17/2020  Medication Sig   aspirin 81 MG tablet Take 1 tablet (81 mg total) by mouth daily.   Blood Glucose Monitoring Suppl (ONETOUCH VERIO) w/Device KIT Check blood sugars once daily   Calcium Citrate-Vitamin D 250-200 MG-UNIT TABS Take 1 tablet by mouth 2 (two) times daily with a meal.   Insulin Pen Needle (PEN NEEDLES) 31G X 8 MM MISC UAD   LANTUS SOLOSTAR 100 UNIT/ML Solostar Pen INJECT 10 UNITS UNDER THE SKIN DAILY   lisinopril-hydrochlorothiazide (ZESTORETIC) 20-12.5 MG tablet Take 1 tablet by mouth daily.   metFORMIN (GLUCOPHAGE) 1000 MG tablet TAKE 1 TABLET(1000 MG) BY MOUTH TWICE DAILY WITH A MEAL   metoprolol succinate (TOPROL-XL) 50 MG 24 hr tablet TAKE 1 AND 1/2 TABLETS(75 MG) BY MOUTH DAILY   OneTouch Delica Lancets 51O MISC Check blood sugar fasting and before meals and again if pt feels bad (symptoms of hypo).   ONETOUCH VERIO test strip CHECK BLOOD SUGARS ONCE A DAY   pravastatin (PRAVACHOL) 40 MG tablet TAKE 1 TABLET(20 MG) BY MOUTH DAILY   [DISCONTINUED] cetirizine-pseudoephedrine (ZYRTEC-D) 5-120 MG tablet Take 1 tablet by mouth daily.   [DISCONTINUED] fexofenadine (ALLEGRA  ALLERGY) 60 MG tablet Take 1 tablet (60 mg total) by mouth 2 (two) times daily.   [DISCONTINUED] fluticasone (FLONASE) 50 MCG/ACT nasal spray Place 2 sprays into both nostrils daily.   [DISCONTINUED] lidocaine (XYLOCAINE) 2 % solution Use as directed 15 mLs in the mouth or throat as needed for mouth pain.   [DISCONTINUED] menthol-cetylpyridinium (CEPACOL) 3 MG lozenge Take 1 lozenge (3 mg total) by mouth as needed for sore throat.   [DISCONTINUED] omeprazole (PRILOSEC) 20 MG capsule TAKE 1 CAPSULE(20 MG) BY MOUTH DAILY   No facility-administered encounter medications on file as of 11/17/2020.    Allergies (verified) Patient has no known allergies.   History: Past Medical History:  Diagnosis Date   Arthritis    Diabetes mellitus    mellitus? x 9 years   Hepatitis C    HTN (hypertension)    x8 years   Hyperlipidemia    Past Surgical History:  Procedure Laterality Date   TOTAL ABDOMINAL HYSTERECTOMY     TUBAL LIGATION     Family History  Problem Relation Age of Onset   Stroke Mother    Rectal cancer Mother    Colon polyps Neg Hx    Colon cancer Neg Hx    Esophageal cancer Neg Hx    Stomach cancer Neg Hx    Social History   Socioeconomic History   Marital status: Single    Spouse name: Not on file   Number  of children: Not on file   Years of education: Not on file   Highest education level: Not on file  Occupational History   Not on file  Tobacco Use   Smoking status: Never   Smokeless tobacco: Current    Types: Snuff  Substance and Sexual Activity   Alcohol use: No    Comment: glass of wine at special occasion   Drug use: No   Sexual activity: Not Currently  Other Topics Concern   Not on file  Social History Narrative   Not on file   Social Determinants of Health   Financial Resource Strain: Low Risk    Difficulty of Paying Living Expenses: Not hard at all  Food Insecurity: No Food Insecurity   Worried About Charity fundraiser in the Last Year: Never true    [place] in the Last Year: Never true  Transportation Needs: No Transportation Needs   Lack of Transportation (Medical): No   Lack of Transportation (Non-Medical): No  Physical Activity: Inactive   Days of Exercise per Week: 0 days   Minutes of Exercise per Session: 0 min  Stress: No Stress Concern Present   Feeling of Stress : Not at all  Social Connections: Unknown   Frequency of Communication with Friends and Family: More than three times a week   Frequency of Social Gatherings with Friends and Family: More than three times a week   Attends Religious Services: More than 4 times per year   Active Member of [employer] or Organizations: Yes   Attends Music therapist: More than 4 times per year   Marital Status: Patient refused    Tobacco Counseling Ready to quit: No Counseling given: Yes   Clinical Intake:  Pre-visit preparation completed: No  Pain : No/denies pain Pain Score: 0-No pain     Nutritional Status: BMI of 19-24  Normal Nutritional Risks: None Diabetes: Yes CBG done?: No CBG resulted in Enter/ Edit results?: No Did pt. bring in CBG monitor from home?: No  How often do you need to have someone help you when you read instructions, pamphlets, or other written materials from your doctor or pharmacy?: 1 - Never  Diabetic? Yes  Interpreter Needed?: No  Information entered by :: [place]   Activities of Daily Living In your present state of health, do you have any difficulty performing the following activities: 11/17/2020  Hearing? N  Vision? N  Difficulty concentrating or making decisions? N  Walking or climbing stairs? N  Dressing or bathing? N  Doing errands, shopping? N  Preparing Food and eating ? N  Using the Toilet? N  In the past six months, have you accidently leaked urine? N  Do you have problems with loss of bowel control? N  Managing your Medications? N  Managing your Finances? N  Housekeeping or managing your Housekeeping? N   Some recent data might be hidden    Patient Care Team: Ladell Pier, MD as PCP - General (Internal Medicine)  Indicate any recent Medical Services you may have received from other than [facility] providers in the past year (date may be approximate).     Assessment:   This is a routine wellness examination for Julyanna.  Hearing/Vision screen No results found.  Dietary issues and exercise activities discussed: Current Exercise Habits: The patient has a physically strenuous job, but has no regular exercise apart from work., Exercise limited by: None identified   Goals Addressed   None  Depression Screen PHQ 2/9 Scores 11/17/2020 09/29/2020 12/06/2019 02/15/2019 07/03/2018 05/29/2018 12/09/2017  PHQ - 2 Score 0 0 0 4 0 0 1  PHQ- 9 Score - - - 7 - - 3    Fall Risk Fall Risk  11/17/2020 12/06/2019 05/29/2018 12/09/2017 01/17/2014  Falls in the past year? 0 0 0 No No  Number falls in past yr: 0 0 - - -  Injury with Fall? 0 0 - - -  Risk for fall due to : No Fall Risks - - - -  Follow up Falls evaluation completed;Education provided;Falls prevention discussed - - - -    FALL RISK PREVENTION PERTAINING TO THE HOME:  Any stairs in or around the home? No  If so, are there any without handrails? No  Home free of loose throw rugs in walkways, pet beds, electrical cords, etc? Yes  Adequate lighting in your home to reduce risk of falls? Yes   ASSISTIVE DEVICES UTILIZED TO PREVENT FALLS:  Life alert? No  Use of a cane, walker or w/c? No  Grab bars in the bathroom? Yes  Shower chair or bench in shower? No  Elevated toilet seat or a handicapped toilet? No   TIMED UP AND GO:  Was the test performed? Yes .  Length of time to ambulate 10 feet: 5 sec.   Gait steady and fast with assistive device  Cognitive Function: MMSE - Mini Mental State Exam 11/17/2020  Orientation to time 5  Orientation to Place 5  Registration 3  Attention/ Calculation 5  Recall 1  Language- name 2 objects 2   Language- repeat 1  Language- follow 3 step command 3  Language- read & follow direction 1  Write a sentence 1  Copy design 1  Total score 28        Immunizations Immunization History  Administered Date(s) Administered   Influenza Split 05/10/2012   Influenza,inj,Quad PF,6+ Mos 02/16/2013, 01/17/2014, 01/13/2016, 01/25/2017, 12/09/2017, 02/15/2019, 12/06/2019   Pneumococcal Conjugate-13 01/17/2014   Pneumococcal Polysaccharide-23 07/24/2013   Tdap 01/13/2016    TDAP status: Up to date  Flu Vaccine status: Up to date  Pneumococcal vaccine status: Up to date  Covid-19 vaccine status: Declined, Education has been provided regarding the importance of this vaccine but patient still declined. Advised may receive this vaccine at local pharmacy or Health Dept.or vaccine clinic. Aware to provide a copy of the vaccination record if obtained from local pharmacy or Health Dept. Verbalized acceptance and understanding.  Qualifies for Shingles Vaccine? Yes Zostavax completed No   Shingrix Completed?: No.    Education has been provided regarding the importance of this vaccine. Patient has been advised to call insurance company to determine out of pocket expense if they have not yet received this vaccine. Advised may also receive vaccine at local pharmacy or Health Dept. Verbalized acceptance and understanding.  Screening Tests Health Maintenance  Topic Date Due   COVID-19 Vaccine (1) Never done   Zoster Vaccines- Shingrix (1 of 2) Never done   INFLUENZA VACCINE  11/17/2020   OPHTHALMOLOGY EXAM  02/25/2021   HEMOGLOBIN A1C  03/31/2021   FOOT EXAM  09/29/2021   MAMMOGRAM  04/09/2022   COLONOSCOPY (Pts 45-56yr Insurance coverage will need to be confirmed)  05/20/2022   TETANUS/TDAP  01/12/2026   DEXA SCAN  Completed   Hepatitis C Screening  Completed   PNA vac Low Risk Adult  Completed   HPV [place]os  Maintenance Due  Topic Date Due    COVID-19 Vaccine (1) Never done   Zoster Vaccines- Shingrix (1 of 2) Never done   INFLUENZA VACCINE  11/17/2020    Colorectal cancer screening: Type of screening: Colonoscopy. Completed 2//2019. Repeat every 5 years  Mammogram status: Completed 04/09/2020. Repeat every year  Bone Density status: Completed 01/16/2019. Results reflect: Bone density results: OSTEOPENIA. Repeat every 2 years.  Lung Cancer Screening: (Low Dose CT Chest recommended if Age 60-80 years, 30 pack-year currently smoking OR have quit w/in 15years.) does not qualify.   Lung Cancer Screening Referral: None  Additional Screening:  Hepatitis C Screening: does qualify; Completed 05/23/2014  Vision Screening: Recommended annual ophthalmology exams for early detection of glaucoma and other disorders of the eye. Is the patient up to date with their annual eye exam?  Yes  Who is the provider or what is the name of the office in which the patient attends annual eye exams? Dr. Katy Fitch If pt is not established with a provider, would they like to be referred to a provider to establish care? No .   Dental Screening: Recommended annual dental exams for proper oral hygiene  Community Resource Referral / Chronic Care Management: CRR required this visit?  No  CCM required this visit?  No      Plan:     I have personally reviewed and noted the following in the patient's chart:   Medical and social history Use of alcohol, tobacco or illicit drugs  Current medications and supplements including opioid prescriptions.  Functional ability and status Nutritional status Physical activity Advanced directives List of other physicians Hospitalizations, surgeries, and ER visits in previous 12 months Vitals Screenings to include cognitive, depression, and falls Referrals and appointments  In addition, I have reviewed and discussed with patient certain preventive protocols, quality metrics, and best practice recommendations. A  written personalized care plan for preventive services as well as general preventive health recommendations were provided to patient.  Tresa Endo, RPH-CPP   11/17/2020

## 2020-12-30 ENCOUNTER — Other Ambulatory Visit: Payer: Self-pay

## 2020-12-30 ENCOUNTER — Encounter (HOSPITAL_COMMUNITY): Payer: Self-pay | Admitting: Emergency Medicine

## 2020-12-30 ENCOUNTER — Emergency Department (HOSPITAL_COMMUNITY)
Admission: EM | Admit: 2020-12-30 | Discharge: 2020-12-30 | Disposition: A | Discharge status: Home / Self Care | Payer: Medicare Other | Attending: Emergency Medicine | Admitting: Emergency Medicine

## 2020-12-30 ENCOUNTER — Emergency Department (HOSPITAL_COMMUNITY): Payer: Medicare Other

## 2020-12-30 ENCOUNTER — Ambulatory Visit (HOSPITAL_COMMUNITY)
Admission: EM | Admit: 2020-12-30 | Discharge: 2020-12-30 | Disposition: A | Discharge status: Home / Self Care | Payer: Medicare Other | Attending: Internal Medicine | Admitting: Internal Medicine

## 2020-12-30 DIAGNOSIS — R55 Syncope and collapse: Secondary | ICD-10-CM

## 2020-12-30 DIAGNOSIS — E119 Type 2 diabetes mellitus without complications: Secondary | ICD-10-CM | POA: Insufficient documentation

## 2020-12-30 DIAGNOSIS — E860 Dehydration: Secondary | ICD-10-CM | POA: Insufficient documentation

## 2020-12-30 DIAGNOSIS — Z7982 Long term (current) use of aspirin: Secondary | ICD-10-CM | POA: Insufficient documentation

## 2020-12-30 DIAGNOSIS — M5459 Other low back pain: Secondary | ICD-10-CM | POA: Diagnosis not present

## 2020-12-30 DIAGNOSIS — I10 Essential (primary) hypertension: Secondary | ICD-10-CM | POA: Diagnosis not present

## 2020-12-30 DIAGNOSIS — U071 COVID-19: Secondary | ICD-10-CM | POA: Diagnosis not present

## 2020-12-30 DIAGNOSIS — Z794 Long term (current) use of insulin: Secondary | ICD-10-CM | POA: Diagnosis not present

## 2020-12-30 DIAGNOSIS — R079 Chest pain, unspecified: Secondary | ICD-10-CM | POA: Diagnosis not present

## 2020-12-30 DIAGNOSIS — R42 Dizziness and giddiness: Secondary | ICD-10-CM

## 2020-12-30 DIAGNOSIS — F17220 Nicotine dependence, chewing tobacco, uncomplicated: Secondary | ICD-10-CM | POA: Diagnosis not present

## 2020-12-30 DIAGNOSIS — M545 Low back pain, unspecified: Secondary | ICD-10-CM | POA: Insufficient documentation

## 2020-12-30 LAB — BASIC METABOLIC PANEL
Anion gap: 9 (ref 5–15)
BUN: 11 mg/dL (ref 8–23)
CO2: 26 mmol/L (ref 22–32)
Calcium: 9.2 mg/dL (ref 8.9–10.3)
Chloride: 102 mmol/L (ref 98–111)
Creatinine, Ser: 0.92 mg/dL (ref 0.44–1.00)
GFR, Estimated: >60 mL/min (ref >60)
Glucose, Bld: 172 mg/dL — ABNORMAL HIGH (ref 70–99)
Potassium: 3.7 mmol/L (ref 3.5–5.1)
Sodium: 137 mmol/L (ref 135–145)

## 2020-12-30 LAB — RESP PANEL BY RT-PCR (FLU A&B, COVID) ARPGX2
Influenza A by PCR: NEGATIVE
Influenza B by PCR: NEGATIVE
SARS Coronavirus 2 by RT PCR: POSITIVE — AB

## 2020-12-30 LAB — CBC
HCT: 40.4 % (ref 36.0–46.0)
Hemoglobin: 13.1 g/dL (ref 12.0–15.0)
MCH: 26.6 pg (ref 26.0–34.0)
MCHC: 32.4 g/dL (ref 30.0–36.0)
MCV: 82.1 fL (ref 80.0–100.0)
Platelets: 187 10*3/uL (ref 150–400)
RBC: 4.92 MIL/uL (ref 3.87–5.11)
RDW: 14.4 % (ref 11.5–15.5)
WBC: 4.6 10*3/uL (ref 4.0–10.5)
nRBC: 0 % (ref 0.0–0.2)

## 2020-12-30 LAB — TROPONIN I (HIGH SENSITIVITY): Troponin I (High Sensitivity): 3 ng/L (ref <18)

## 2020-12-30 NOTE — ED Triage Notes (Addendum)
Pt presents with nausea, weakness, cough xs 3 days. States around 0200 this am fell and blacked out for "a few seconds." Called EMS and was told to come to urgent care to be seen faster rather than going to the hospital and waiting 20+ hours to be seen.   States felt dizzy and weak when getting up to use the bathroom this am. When getting up from toilet passed out.

## 2020-12-30 NOTE — ED Notes (Signed)
Patient is being discharged from the Urgent Care and sent to the Emergency Department via pov . Per fowler smith, np, patient is in need of higher level of care due to dizziness, syncope. Patient is aware and verbalizes understanding of plan of care.  Vitals:   12/30/20 0930  BP: 129/63  Pulse: 90  Resp: 17  Temp: 99.4 F (37.4 C)  SpO2: 98%

## 2020-12-30 NOTE — ED Notes (Signed)
Got patient on the monitor patient is resting with call bell in reach  ?

## 2020-12-30 NOTE — Discharge Instructions (Signed)
Home to rest and hydrate. Your COVID test results should be available in your MyChart account later today. IF your COVID test is positive, contact your doctor to discuss Paxlovid. Return to the ER for worsening or concerning symptoms.

## 2020-12-30 NOTE — ED Provider Notes (Signed)
[facility] EMERGENCY DEPARTMENT Provider Note   CSN: [id number] Arrival date & time: 12/30/20  1041     History Chief Complaint  Patient presents with   Loss of Consciousness    Cindy Taylor is a 75 y.o. female.  75 yo female with history of diabetes, HTN, hyperlipidemia, syncopal episode last night, stood up from the sofa and felt dizzy (described as generally weak/light headed), walked to the bathroom and felt dizzy again upon standing from the commode and passed out. EMS was called, patient was assessed and then went to UC where she was evaluated and given ER precautions after a reassuring exam. Patient reports pain in her right shoulder as a result of her syncope/fall. Reports cough, body aches, fevers/chills, x 3 days, reports no known sick contacts.  Reports right shoulder pain at baseline, no new injury. Reports low back ache after her fall.       Past Medical History:  Diagnosis Date   Arthritis    Diabetes mellitus    mellitus? x 9 years   Hepatitis C    HTN (hypertension)    x8 years   Hyperlipidemia     Patient Active Problem List   Diagnosis Date Noted   Microalbuminuria 09/30/2020   Unintentional weight loss 10/22/2016   Chews tobacco 01/13/2016   Onychomycosis of multiple toenails with type 2 diabetes mellitus ([place]) 01/13/2016   History of hepatitis C 05/23/2014   DM type 2 (diabetes mellitus, type 2) ([place]) 11/16/2012   Hyperlipidemia associated with type 2 diabetes mellitus ([place]) 11/16/2012   Essential hypertension, benign 12/12/2008    Past Surgical History:  Procedure Laterality Date   TOTAL ABDOMINAL HYSTERECTOMY     TUBAL LIGATION       OB History   No obstetric history on file.     Family History  Problem Relation Age of Onset   Stroke Mother    Rectal cancer Mother    Colon polyps Neg Hx    Colon cancer Neg Hx    Esophageal cancer Neg Hx    Stomach cancer Neg Hx     Social History   Tobacco Use    Smoking status: Never   Smokeless tobacco: Current    Types: Snuff  Substance Use Topics   Alcohol use: No    Comment: glass of wine at special occasion   Drug use: No    Home Medications Prior to Admission medications   Medication Sig Start Date End Date Taking? Authorizing Provider  aspirin 81 MG tablet Take 1 tablet (81 mg total) by mouth daily. 10/22/16   Funches, Adriana Mccallum, MD  Blood Glucose Monitoring Suppl (ONETOUCH VERIO) w/Device KIT Check blood sugars once daily 07/03/18   Ladell Pier, MD  Calcium Citrate-Vitamin D 250-200 MG-UNIT TABS Take 1 tablet by mouth 2 (two) times daily with a meal.    [provider]  Insulin Pen Needle (B-D ULTRAFINE III SHORT PEN) 31G X 8 MM MISC USE AS DIRECTED WITH INSULIN PEN 11/19/20   Ladell Pier, MD  LANTUS SOLOSTAR 100 UNIT/ML Solostar Pen INJECT 10 UNITS UNDER THE SKIN DAILY 11/15/20   Ladell Pier, MD  lisinopril-hydrochlorothiazide (ZESTORETIC) 20-12.5 MG tablet Take 1 tablet by mouth daily. 09/29/20   Ladell Pier, MD  metFORMIN (GLUCOPHAGE) 1000 MG tablet TAKE 1 TABLET(1000 MG) BY MOUTH TWICE DAILY WITH A MEAL 09/29/20   Ladell Pier, MD  metoprolol succinate (TOPROL-XL) 50 MG 24 hr tablet TAKE 1 AND  1/2 TABLETS(75 MG) BY MOUTH DAILY 09/29/20   Ladell Pier, MD  OneTouch Delica Lancets 73X MISC Check blood sugar fasting and before meals and again if pt feels bad (symptoms of hypo). 07/03/18   Ladell Pier, MD  [facility] VERIO test strip CHECK BLOOD SUGARS ONCE A DAY 04/09/20   Ladell Pier, MD  pravastatin (PRAVACHOL) 40 MG tablet TAKE 1 TABLET(20 MG) BY MOUTH DAILY 09/30/20   Ladell Pier, MD    Allergies    Patient has no known allergies.  Review of Systems   Review of Systems  Constitutional:  Negative for fever.  Eyes:  Negative for visual disturbance.  Respiratory:  Negative for shortness of breath.   Cardiovascular:  Negative for chest pain.  Gastrointestinal:  Negative for  abdominal pain, nausea and vomiting.  Genitourinary:  Negative for decreased urine volume and dysuria.  Musculoskeletal:  Positive for back pain and myalgias. Negative for gait problem and joint swelling.  Skin:  Negative for rash and wound.  Allergic/Immunologic: Positive for immunocompromised state.  Neurological:  Positive for light-headedness. Negative for speech difficulty and weakness.  Hematological:  Does not bruise/bleed easily.  Psychiatric/Behavioral:  Negative for confusion.   All other systems reviewed and are negative.  Physical Exam Updated Vital Signs BP (!) 143/76   Pulse 86   Temp 99 F (37.2 C) (Oral)   Resp (!) 21   SpO2 96%   Physical Exam Vitals and nursing note reviewed.  Constitutional:      General: She is not in acute distress.    Appearance: She is well-developed. She is not diaphoretic.  HENT:     Head: Normocephalic and atraumatic.  Eyes:     Extraocular Movements: Extraocular movements intact.     Pupils: Pupils are equal, round, and reactive to light.  Cardiovascular:     Rate and Rhythm: Normal rate and regular rhythm.     Heart sounds: Normal heart sounds.  Pulmonary:     Effort: Pulmonary effort is normal.     Breath sounds: Normal breath sounds.  Abdominal:     Palpations: Abdomen is soft.     Tenderness: There is no abdominal tenderness.  Musculoskeletal:     Cervical back: Normal range of motion and neck supple. No tenderness.     Right lower leg: No edema.     Left lower leg: No edema.  Skin:    General: Skin is warm and dry.     Findings: No erythema or rash.  Neurological:     Mental Status: She is alert and oriented to person, place, and time.     Cranial Nerves: No cranial nerve deficit.     Sensory: No sensory deficit.     Motor: No weakness.     Gait: Gait normal.  Psychiatric:        Behavior: Behavior normal.    ED Results / Procedures / Treatments   Labs (all labs ordered are listed, but only abnormal results are  displayed) Labs Reviewed  BASIC METABOLIC PANEL - Abnormal; Notable for the following components:      Result Value   Glucose, Bld 172 (*)    All other components within normal limits  RESP PANEL BY RT-PCR (FLU A&B, COVID) [id number]  CBC  TROPONIN I (HIGH SENSITIVITY)    EKG None  Radiology DG Chest 2 View  Result Date: 12/30/2020 CLINICAL DATA:  Chest pain, loss of consciousness EXAM: CHEST - 2 VIEW COMPARISON:  None. FINDINGS:  Heart and mediastinal contours are within normal limits. No focal opacities or effusions. No acute bony abnormality. IMPRESSION: No active cardiopulmonary disease. Electronically Signed   By: Rolm Baptise M.D.   On: 12/30/2020 11:21   DG Lumbar Spine Complete  Result Date: 12/30/2020 CLINICAL DATA:  Low back pain, fall EXAM: LUMBAR SPINE - COMPLETE 4+ VIEW COMPARISON:  None. FINDINGS: Normal alignment of lumbar vertebral bodies. No loss of vertebral body height or disc height. No pars fracture. No subluxation. IMPRESSION: No acute findings lumbar spine. Electronically Signed   By: Suzy Bouchard M.D.   On: 12/30/2020 13:39    Procedures Procedures   Medications Ordered in ED Medications - No data to display  ED Course  I have reviewed the triage vital signs and the nursing notes.  Pertinent labs & imaging results that were available during my care of the patient were reviewed by me and considered in my medical decision making (see chart for details).  Clinical Course as of 12/30/20 1455  Tue Dec 30, 3416  [id number] 75 year old female presents from home with complaint of syncopal episode as above.  Reports low back pain with ongoing right shoulder pain.  Does have mild left and right lower back tenderness on exam, x-ray lumbar spine unremarkable.  Right shoulder exam unremarkable.  Patient is mildly orthostatic with decrease in blood pressure when transitioning from lying to standing, heart rate stable, asymptomatic otherwise.  Continues to feel well.  Labs  reassuring including CBC, BMP and troponin, EKG without ischemic changes.  COVID test pending, advised to follow-up in her MyChart results and discuss with her PCP if positive to see if she is eligible for Paxlovid.  Given return to ER precautions. [LM]    Clinical Course User Index [LM] Roque Lias   MDM Rules/Calculators/A&P                           Final Clinical Impression(s) / ED Diagnoses Final diagnoses:  Syncope, unspecified syncope type  Dehydration  Acute bilateral low back pain without sciatica    Rx / DC Orders ED Discharge Orders     None        Tacy Learn, PA-C 12/30/20 1455    Noemi Chapel, MD 01/02/21 [phone]

## 2020-12-30 NOTE — ED Provider Notes (Signed)
[place]    CSN: [id number] Arrival date & time: 12/30/20  0912      History   Chief Complaint Chief Complaint  Patient presents with   Fall   Weakness   Nausea   Cough   Loss of Consciousness    HPI Cindy Taylor is a 75 y.o. female.   Patient here for evaluation of dizziness and syncope.  Reports dizziness that has been ongoing since Saturday.  Reports getting up this am to go to the bathroom and then "blacked out."  Reports remembering going to the bathroom but then the next thing she remembers is her son helping her up.  Denies any nausea or vomiting.  Reports some intermittent diarrhea.  Reports having a headache over the weekend but denies any headache at this time.  Patient was evaluated by EMS after syncopal episode this am and states that her blood sugar and BP where both normal.  Denies any trauma, injury, or other precipitating event.  Denies any specific alleviating or aggravating factors.  Denies any fevers, chest pain, shortness of breath, numbness, tingling, weakness, abdominal pain.    The history is provided by the patient.  Fall Associated symptoms include headaches. Pertinent negatives include no chest pain.  Weakness Associated symptoms: cough, dizziness, headaches and syncope   Associated symptoms: no chest pain and no fever   Cough Associated symptoms: headaches   Associated symptoms: no chest pain and no fever   Loss of Consciousness Associated symptoms: dizziness, headaches and weakness   Associated symptoms: no chest pain and no fever    Past Medical History:  Diagnosis Date   Arthritis    Diabetes mellitus    mellitus? x 9 years   Hepatitis C    HTN (hypertension)    x8 years   Hyperlipidemia     Patient Active Problem List   Diagnosis Date Noted   Microalbuminuria 09/30/2020   Unintentional weight loss 10/22/2016   Chews tobacco 01/13/2016   Onychomycosis of multiple toenails with type 2 diabetes mellitus ([place])  01/13/2016   History of hepatitis C 05/23/2014   DM type 2 (diabetes mellitus, type 2) ([place]) 11/16/2012   Hyperlipidemia associated with type 2 diabetes mellitus ([place]) 11/16/2012   Essential hypertension, benign 12/12/2008    Past Surgical History:  Procedure Laterality Date   TOTAL ABDOMINAL HYSTERECTOMY     TUBAL LIGATION      OB History   No obstetric history on file.      Home Medications    Prior to Admission medications   Medication Sig Start Date End Date Taking? Authorizing Provider  aspirin 81 MG tablet Take 1 tablet (81 mg total) by mouth daily. 10/22/16   Funches, Adriana Mccallum, MD  Blood Glucose Monitoring Suppl (ONETOUCH VERIO) w/Device KIT Check blood sugars once daily 07/03/18   Ladell Pier, MD  Calcium Citrate-Vitamin D 250-200 MG-UNIT TABS Take 1 tablet by mouth 2 (two) times daily with a meal.    [provider]  Insulin Pen Needle (B-D ULTRAFINE III SHORT PEN) 31G X 8 MM MISC USE AS DIRECTED WITH INSULIN PEN 11/19/20   Ladell Pier, MD  LANTUS SOLOSTAR 100 UNIT/ML Solostar Pen INJECT 10 UNITS UNDER THE SKIN DAILY 11/15/20   Ladell Pier, MD  lisinopril-hydrochlorothiazide (ZESTORETIC) 20-12.5 MG tablet Take 1 tablet by mouth daily. 09/29/20   Ladell Pier, MD  metFORMIN (GLUCOPHAGE) 1000 MG tablet TAKE 1 TABLET(1000 MG) BY MOUTH TWICE DAILY WITH A MEAL 09/29/20  Ladell Pier, MD  metoprolol succinate (TOPROL-XL) 50 MG 24 hr tablet TAKE 1 AND 1/2 TABLETS(75 MG) BY MOUTH DAILY 09/29/20   Ladell Pier, MD  OneTouch Delica Lancets 47Q MISC Check blood sugar fasting and before meals and again if pt feels bad (symptoms of hypo). 07/03/18   Ladell Pier, MD  [facility] VERIO test strip CHECK BLOOD SUGARS ONCE A DAY 04/09/20   Ladell Pier, MD  pravastatin (PRAVACHOL) 40 MG tablet TAKE 1 TABLET(20 MG) BY MOUTH DAILY 09/30/20   Ladell Pier, MD    Family History Family History  Problem Relation Age of Onset   Stroke Mother     Rectal cancer Mother    Colon polyps Neg Hx    Colon cancer Neg Hx    Esophageal cancer Neg Hx    Stomach cancer Neg Hx     Social History Social History   Tobacco Use   Smoking status: Never   Smokeless tobacco: Current    Types: Snuff  Substance Use Topics   Alcohol use: No    Comment: glass of wine at special occasion   Drug use: No     Allergies   Patient has no known allergies.   Review of Systems Review of Systems  Constitutional:  Negative for fever.  Respiratory:  Positive for cough.   Cardiovascular:  Positive for syncope. Negative for chest pain.  Neurological:  Positive for dizziness, syncope, weakness and headaches.  All other systems reviewed and are negative.   Physical Exam Triage Vital Signs ED Triage Vitals  Enc Vitals Group     BP 12/30/20 0930 129/63     Pulse Rate 12/30/20 0930 90     Resp 12/30/20 0930 17     Temp 12/30/20 0930 99.4 F (37.4 C)     Temp Source 12/30/20 0930 Oral     SpO2 12/30/20 0930 98 %     Weight --      Height --      Head Circumference --      Peak Flow --      Pain Score 12/30/20 0928 0     Pain Loc --      Pain Edu? --      Excl. in [place]? --    Orthostatic VS for the past 24 hrs:  BP- Lying Pulse- Lying BP- Sitting Pulse- Sitting BP- Standing at 0 minutes Pulse- Standing at 0 minutes  12/30/20 1030 121/62 97 117/66 96 104/64 95    Updated Vital Signs BP 129/63 (BP Location: Right Arm)   Pulse 90   Temp 99.4 F (37.4 C) (Oral)   Resp 17   SpO2 98%   Visual Acuity Right Eye Distance:   Left Eye Distance:   Bilateral Distance:    Right Eye Near:   Left Eye Near:    Bilateral Near:     Physical Exam Vitals and nursing note reviewed.  Constitutional:      General: She is not in acute distress.    Appearance: Normal appearance. She is not ill-appearing, toxic-appearing or diaphoretic.  HENT:     Head: Normocephalic and atraumatic.     Nose: No congestion.  Eyes:     General: No visual field  deficit.    Extraocular Movements: Extraocular movements intact.     Conjunctiva/sclera: Conjunctivae normal.     Pupils: Pupils are equal, round, and reactive to light.  Cardiovascular:     Rate and Rhythm: Normal rate and  regular rhythm.     Pulses: Normal pulses.     Heart sounds: Normal heart sounds.  Pulmonary:     Effort: Pulmonary effort is normal.     Breath sounds: Normal breath sounds.  Abdominal:     General: Abdomen is flat.     Palpations: Abdomen is soft.  Musculoskeletal:        General: Normal range of motion.     Cervical back: Normal range of motion.  Skin:    General: Skin is warm and dry.  Neurological:     General: No focal deficit present.     Mental Status: She is alert and oriented to person, place, and time.     GCS: GCS eye subscore is 4. GCS verbal subscore is 5. GCS motor subscore is 6.     Cranial Nerves: No dysarthria or facial asymmetry.     Motor: Motor function is intact.     Coordination: Coordination is intact.     Gait: Gait is intact.  Psychiatric:        Mood and Affect: Mood normal.     UC Treatments / Results  Labs (all labs ordered are listed, but only abnormal results are displayed) Labs Reviewed - No data to display  EKG   Radiology No results found.  Procedures Procedures (including critical care time)  Medications Ordered in UC Medications - No data to display  Initial Impression / Assessment and Plan / UC Course  I have reviewed the triage vital signs and the nursing notes.  Pertinent labs & imaging results that were available during my care of the patient were reviewed by me and considered in my medical decision making (see chart for details).   EKG normal sinus rhythm with normal rate and rhythm.  Patient was mildly orthostatic on evaluation but denies any dizziness when moving from laying to sitting and from sitting to standing.  Discussed with patient that exam was benign with no concerning findings.  Dizziness  and syncope could be due to vasovagal syndrome or dehydration as patient reports that she does not drink much water.  Discussed that patient could go home and rest with recommendations to drink plenty of fluids and follow up with her PCP and strict ED guidelines for red flag symptoms.  However, patient reports continued concern due to dizziness and syncope.  Patient was instructed that she could go to the ED for further evaluation.  Patient expressed that she would like to go ahead and go to the ED.  Vital signs are stable so patient may go to the ED via private vehicle with family member.  Final Clinical Impressions(s) / UC Diagnoses   Final diagnoses:  Syncope, unspecified syncope type  Dizziness   Discharge Instructions   None    ED Prescriptions   None    PDMP not reviewed this encounter.   Pearson Forster, NP 12/30/20 1049

## 2020-12-30 NOTE — ED Triage Notes (Signed)
Pt states she has been feeling sick for two days- bodyaches, cp that started on Saturday. Pt felt dizzy last night and passed out in the bathroom. Pt states she did not hit her head.

## 2020-12-30 NOTE — ED Notes (Signed)
Per Olene Craven, PA okay for pt to be evaluated at [facility] first. Will bring to room once available.

## 2020-12-30 NOTE — ED Notes (Signed)
Attempted to called pt for room x1

## 2021-01-29 ENCOUNTER — Ambulatory Visit: Payer: Medicare Other | Admitting: Internal Medicine

## 2021-03-05 ENCOUNTER — Ambulatory Visit: Payer: Medicare Other | Admitting: Internal Medicine

## 2021-03-06 ENCOUNTER — Ambulatory Visit: Payer: Self-pay

## 2021-03-06 NOTE — Telephone Encounter (Signed)
Pt is calling to ask has she had her hep B shoot the job is giving the shot out. Pt does not want to receive the shot if she has already received it. Please advise.  Left message to call back.

## 2021-03-17 ENCOUNTER — Encounter: Payer: Self-pay | Admitting: Internal Medicine

## 2021-03-17 ENCOUNTER — Ambulatory Visit: Payer: Medicare Other | Attending: Internal Medicine | Admitting: Internal Medicine

## 2021-03-17 ENCOUNTER — Other Ambulatory Visit: Payer: Self-pay

## 2021-03-17 VITALS — BP 146/84 | HR 71 | Resp 16 | Wt 135.8 lb

## 2021-03-17 DIAGNOSIS — E119 Type 2 diabetes mellitus without complications: Secondary | ICD-10-CM | POA: Diagnosis not present

## 2021-03-17 DIAGNOSIS — H9202 Otalgia, left ear: Secondary | ICD-10-CM

## 2021-03-17 DIAGNOSIS — Z23 Encounter for immunization: Secondary | ICD-10-CM

## 2021-03-17 DIAGNOSIS — E785 Hyperlipidemia, unspecified: Secondary | ICD-10-CM | POA: Diagnosis not present

## 2021-03-17 DIAGNOSIS — I152 Hypertension secondary to endocrine disorders: Secondary | ICD-10-CM

## 2021-03-17 DIAGNOSIS — E1169 Type 2 diabetes mellitus with other specified complication: Secondary | ICD-10-CM

## 2021-03-17 DIAGNOSIS — Z794 Long term (current) use of insulin: Secondary | ICD-10-CM

## 2021-03-17 DIAGNOSIS — I8393 Asymptomatic varicose veins of bilateral lower extremities: Secondary | ICD-10-CM

## 2021-03-17 DIAGNOSIS — E1159 Type 2 diabetes mellitus with other circulatory complications: Secondary | ICD-10-CM | POA: Diagnosis not present

## 2021-03-17 DIAGNOSIS — Z8619 Personal history of other infectious and parasitic diseases: Secondary | ICD-10-CM | POA: Diagnosis not present

## 2021-03-17 LAB — GLUCOSE, POCT (MANUAL RESULT ENTRY): POC Glucose: 118 mg/dl — AB (ref 70–99)

## 2021-03-17 LAB — POCT GLYCOSYLATED HEMOGLOBIN (HGB A1C): HbA1c, POC (controlled diabetic range): 6.4 % (ref 0.0–7.0)

## 2021-03-17 MED ORDER — AMLODIPINE BESYLATE 2.5 MG PO TABS: 2.5000 mg | ORAL_TABLET | Freq: Every day | ORAL | 2 refills | Status: DC

## 2021-03-17 NOTE — Patient Instructions (Signed)
Your blood pressure is not at goal.  Goal is 130/80 or lower.  We have added a low-dose of a blood pressure medication called amlodipine to take once a day.  Continue the lisinopril/hydrochlorothiazide and metoprolol.  Consider purchasing compression socks over-the-counter and wearing them while at work.

## 2021-03-17 NOTE — Progress Notes (Signed)
Patient ID: Cindy Taylor, female    DOB: 05/30/1945  MRN: [id number]  CC: Diabetes, Hypertension, and Ear Pain (Left )   Subjective: Cindy Taylor is a 75 y.o. female who presents for chronic ds management Her concerns today include:  Pt with DM, HTN, HL, Hep C treated with Harvoni, snuff user, CKD 2-3  HM:  due for flu shot and shingles vaccine.  She wants to hold off on getting shingles  HYPERTENSION Currently taking: see medication list.  She is on lisinopril/HCTZ 20/12.5 mg daily, metoprolol 50 mg daily.  She took her medications already for the morning Med Adherence: _0  Yes    Medication side effects: _1  Yes    _2  No Adherence with salt restriction: _3  Yes     Home Monitoring?: _4  Yes    _5  No Monitoring Frequency:  Home BP results range:  SOB? _6  Yes    _7  No Chest Pain?: _8  Yes    _9  No Leg swelling?: _10  Yes    _11  No Headaches?: _12  Yes    _13  No Dizziness? _14  Yes -occasionally if she bends over   _15  No Comments:   DIABETES TYPE 2 Last A1C:   Results for orders placed or performed in visit on 03/17/21  POCT glucose (manual entry)  Result Value Ref Range   POC Glucose 118 (A) 70 - 99 mg/dl  POCT glycosylated hemoglobin (Hb A1C)  Result Value Ref Range   Hemoglobin A1C     HbA1c POC (<> result, manual entry)     HbA1c, POC (prediabetic range)     HbA1c, POC (controlled diabetic range) 6.4 0.0 - 7.0 %    Med Adherence:  _16  Yes.  She is on Lantus 10 units daily and metformin 1000 mg twice a day.    _17  No Medication side effects:  _18  Yes    _19  No Home Monitoring?  _20  Yes twice a wk   _21  No Home glucose results range: less than 150 Diet Adherence: _22  Yes with occasional sweet snack   Exercise: _23  Yes -very active at work   _24  No Hypoglycemic episodes?: _25  Yes    _26  No Numbness of the feet? _27  Yes    _28  No Retinopathy hx? _29  Yes    _30  No Last eye exam:  Comments:   HL: Last LDL was 92 in June of this year.  Pravachol increased to 40  mg.  Taking and tolerating the medication.  Had COVID infection 12/2020.  She has had 3 COVID shots GFR:  was dec on last visit to 14.  Repeat chem done in 12/2020 during COVID infection had improved to >60  Patient Active Problem List   Diagnosis Date Noted   Microalbuminuria 09/30/2020   Unintentional weight loss 10/22/2016   Chews tobacco 01/13/2016   Onychomycosis of multiple toenails with type 2 diabetes mellitus ([place]) 01/13/2016   History of hepatitis C 05/23/2014   DM type 2 (diabetes mellitus, type 2) ([place]) 11/16/2012   Hyperlipidemia associated with type 2 diabetes mellitus ([place]) 11/16/2012   Essential hypertension, benign 12/12/2008     Current Outpatient Medications on File Prior to Visit  Medication Sig Dispense Refill   aspirin 81 MG tablet Take 1 tablet (81 mg total) by mouth daily. 90 tablet 3   Blood Glucose Monitoring Suppl (ONETOUCH VERIO) w/Device KIT Check blood sugars once daily 1 kit 0   Calcium Citrate-Vitamin D 250-200 MG-UNIT TABS Take 1 tablet by mouth 2 (two) times daily with  a meal.     Insulin Pen Needle (B-D ULTRAFINE III SHORT PEN) 31G X 8 MM MISC USE AS DIRECTED WITH INSULIN PEN 100 each 6   LANTUS SOLOSTAR 100 UNIT/ML Solostar Pen INJECT 10 UNITS UNDER THE SKIN DAILY 15 mL 2   lisinopril-hydrochlorothiazide (ZESTORETIC) 20-12.5 MG tablet Take 1 tablet by mouth daily. 90 tablet 1   metFORMIN (GLUCOPHAGE) 1000 MG tablet TAKE 1 TABLET(1000 MG) BY MOUTH TWICE DAILY WITH A MEAL 180 tablet 1   metoprolol succinate (TOPROL-XL) 50 MG 24 hr tablet TAKE 1 AND 1/2 TABLETS(75 MG) BY MOUTH DAILY 135 tablet 1   OneTouch Delica Lancets 47Q MISC Check blood sugar fasting and before meals and again if pt feels bad (symptoms of hypo). 100 each 12   ONETOUCH VERIO test strip CHECK BLOOD SUGARS ONCE A DAY 100 strip 3   pravastatin (PRAVACHOL) 40 MG tablet TAKE 1 TABLET(20 MG) BY MOUTH DAILY 30 tablet 6   No current facility-administered medications on file prior to visit.     No Known Allergies  Social History   Socioeconomic History   Marital status: Single    Spouse name: Not on file   Number of children: Not on file   Years of education: Not on file   Highest education level: Not on file  Occupational History   Not on file  Tobacco Use   Smoking status: Never   Smokeless tobacco: Current    Types: Snuff  Substance and Sexual Activity   Alcohol use: No    Comment: glass of wine at special occasion   Drug use: No   Sexual activity: Not Currently  Other Topics Concern   Not on file  Social History Narrative   Not on file   Social Determinants of Health   Financial Resource Strain: Low Risk    Difficulty of Paying Living Expenses: Not hard at all  Food Insecurity: No Food Insecurity   Worried About Charity fundraiser in the Last Year: Never true   [place] in the Last Year: Never true  Transportation Needs: No Transportation Needs   Lack of Transportation (Medical): No   Lack of Transportation (Non-Medical): No  Physical Activity: Inactive   Days of Exercise per Week: 0 days   Minutes of Exercise per Session: 0 min  Stress: No Stress Concern Present   Feeling of Stress : Not at all  Social Connections: Unknown   Frequency of Communication with Friends and Family: More than three times a week   Frequency of Social Gatherings with Friends and Family: More than three times a week   Attends Religious Services: More than 4 times per year   Active Member of [employer] or Organizations: Yes   Attends Music therapist: More than 4 times per year   Marital Status: Patient refused  Intimate Partner Violence: Not At Risk   Fear of Current or Ex-Partner: No   Emotionally Abused: No   Physically Abused: No   Sexually Abused: No    Family History  Problem Relation Age of Onset   Stroke Mother    Rectal cancer Mother    Colon polyps Neg Hx    Colon cancer Neg Hx    Esophageal cancer Neg Hx    Stomach cancer Neg Hx      Past Surgical History:  Procedure Laterality Date   TOTAL ABDOMINAL HYSTERECTOMY     TUBAL LIGATION      ROS: Review of Systems  Constitutional:        She was treated for hepatitis C in the past.  She does not recall receiving hepatitis B vaccine series but she is not sure.  She would like to be checked to see whether she is immunized.  HENT:         Reports intermittent pain in the left ear but not in the past week.  No decreased hearing.  Skin:        Has varicose veins on both lower extremities.  She wants to know whether they are of any concern.  They are not painful to her.  No heaviness or pain in the legs with walking.    PHYSICAL EXAM: BP (!) 146/84   Pulse 71   Resp 16   Wt 135 lb 12.8 oz (61.6 kg)   SpO2 97%   BMI 23.31 kg/m   Wt Readings from Last 3 Encounters:  03/17/21 135 lb 12.8 oz (61.6 kg)  11/17/20 136 lb 3.2 oz (61.8 kg)  09/29/20 140 lb 6.4 oz (63.7 kg)    Repeat 152/83 Physical Exam  General appearance - alert, well appearing, and in no distress Mental status - normal mood, behavior, speech, dress, motor activity, and thought processes Mouth - mucous membranes moist, pharynx normal without lesions Ears: Both ear canal and membranes are within normal limits. Neck - supple, no significant adenopathy Chest - clear to auscultation, no wheezes, rales or rhonchi, symmetric air entry Heart - normal rate, regular rhythm, normal S1, S2, no murmurs, rubs, clicks or gallops Extremities -no lower extremity edema.  She has some serpiginous varicose veins on the right calf laterally.  She also has some on the left calf but more pronounced on the right.  They are not inflamed.  Nontender to touch.   CMP Latest Ref Rng & Units 12/30/2020 09/29/2020 09/20/2019  Glucose 70 - 99 mg/dL 172(H) 84 151(H)  BUN 8 - 23 mg/dL _0 Creatinine 0.44 - 1.00 mg/dL 0.92 1.27(H) 0.97  Sodium 135 - 145 mmol/L 137 140 140  Potassium 3.5 - 5.1 mmol/L 3.7 4.8 4.0  Chloride 98  - 111 mmol/L 102 102 101  CO2 22 - 32 mmol/L _1 Calcium 8.9 - 10.3 mg/dL 9.2 9.7 9.7  Total Protein 6.0 - 8.5 g/dL - 7.6 -  Total Bilirubin 0.0 - 1.2 mg/dL - 0.2 -  Alkaline Phos 44 - 121 IU/L - 56 -  AST 0 - 40 IU/L - 14 -  ALT 0 - 32 IU/L - 16 -   Lipid Panel     Component Value Date/Time   CHOL 169 09/29/2020 1646   TRIG 96 09/29/2020 1646   HDL 60 09/29/2020 1646   CHOLHDL 2.8 09/29/2020 1646   CHOLHDL 2.8 07/29/2015 0918   VLDL 16 07/29/2015 0918   LDLCALC 92 09/29/2020 1646   LDLDIRECT 71 12/03/2009 2014    CBC    Component Value Date/Time   WBC 4.6 12/30/2020 1147   RBC 4.92 12/30/2020 1147   HGB 13.1 12/30/2020 1147   HGB 13.1 09/04/2019 1651   HCT 40.4 12/30/2020 1147   HCT 40.2 09/04/2019 1651   PLT 187 12/30/2020 1147   PLT 201 09/04/2019 1651   MCV 82.1 12/30/2020 1147   MCV 83 09/04/2019 1651   MCH 26.6 12/30/2020 1147   MCHC 32.4 12/30/2020 1147   RDW 14.4 12/30/2020 1147   RDW 13.1 09/04/2019 1651   LYMPHSABS 3.9 07/24/2013 1741  MONOABS 0.6 07/24/2013 1741   EOSABS 0.1 07/24/2013 1741   BASOSABS 0.0 07/24/2013 1741    ASSESSMENT AND PLAN: 1. Type 2 diabetes mellitus without complication, with long-term current use of insulin (HCC) At goal.  I commended her on this.  She will continue current dose of Lantus and metformin.  Encouraged to continue healthy eating habits and regular exercise - POCT glucose (manual entry) - POCT glycosylated hemoglobin (Hb A1C)  2. Hypertension associated with diabetes ([place]) Not at goal of 130/80 or lower.  Continue metoprolol and lisinopril/HCTZ.  We have added a low-dose of amlodipine. - amLODipine (NORVASC) 2.5 MG tablet; Take 1 tablet (2.5 mg total) by mouth daily.  Dispense: 90 tablet; Refill: 2  3. Hyperlipidemia associated with type 2 diabetes mellitus (HCC) Continue Pravachol 40 mg daily  4. Left ear pain Reassurance given at this time.  No signs of infection  5. Asymptomatic varicose veins of  both lower extremities Recommend purchasing some compression socks over-the-counter and wearing them when she is up and active during the day.  6. Need for immunization against influenza - Flu Vaccine QUAD 53moIM (Fluarix, Fluzone & Alfiuria Quad PF)  7. History of hepatitis C - Hepatitis B surface antigen - Hepatitis B surface antibody,quantitative    Patient was given the opportunity to ask questions.  Patient verbalized understanding of the plan and was able to repeat key elements of the plan.   Orders Placed This Encounter  Procedures   Flu Vaccine QUAD 674moM (Fluarix, Fluzone & Alfiuria Quad PF)   Hepatitis B surface antigen   Hepatitis B surface antibody,quantitative   POCT glucose (manual entry)   POCT glycosylated hemoglobin (Hb A1C)     Requested Prescriptions   Signed Prescriptions Disp Refills   amLODipine (NORVASC) 2.5 MG tablet 90 tablet 2    Sig: Take 1 tablet (2.5 mg total) by mouth daily.    Return in about 4 months (around 07/15/2021).  DeKarle PlumberMD, FACP

## 2021-03-18 ENCOUNTER — Telehealth: Payer: Self-pay

## 2021-03-18 LAB — HEPATITIS B SURFACE ANTIGEN: Hepatitis B Surface Ag: NEGATIVE

## 2021-03-18 LAB — HEPATITIS B SURFACE ANTIBODY, QUANTITATIVE: Hepatitis B Surf Ab Quant: 33.2 m[IU]/mL (ref >9.9)

## 2021-03-18 NOTE — Progress Notes (Signed)
Based on lab results, she is adequately immunized against hepatitis B.  She does not need vaccine for hepatitis B.

## 2021-03-18 NOTE — Telephone Encounter (Signed)
Contacted pt to go over lab results pt didn't answer lvm   Sent a CRM and forward labs to NT to give pt labs when they call back   

## 2021-03-24 ENCOUNTER — Telehealth: Payer: Self-pay | Admitting: Internal Medicine

## 2021-03-24 NOTE — Telephone Encounter (Signed)
Pt called back for lab results from 03/17/21. Per notes pt had been notified.  Please send pt a hard copy. Thank you!

## 2021-03-24 NOTE — Telephone Encounter (Signed)
Hard copy of results has been mailed per pt request

## 2021-04-13 ENCOUNTER — Other Ambulatory Visit: Payer: Self-pay | Admitting: Internal Medicine

## 2021-04-13 DIAGNOSIS — E119 Type 2 diabetes mellitus without complications: Secondary | ICD-10-CM

## 2021-04-17 ENCOUNTER — Other Ambulatory Visit: Payer: Self-pay | Admitting: Internal Medicine

## 2021-04-17 DIAGNOSIS — Z1231 Encounter for screening mammogram for malignant neoplasm of breast: Secondary | ICD-10-CM

## 2021-05-07 ENCOUNTER — Ambulatory Visit
Admission: RE | Admit: 2021-05-07 | Discharge: 2021-05-07 | Disposition: A | Discharge status: Home / Self Care | Payer: Medicare Other | Source: Ambulatory Visit | Attending: Internal Medicine | Admitting: Internal Medicine

## 2021-05-07 DIAGNOSIS — Z1231 Encounter for screening mammogram for malignant neoplasm of breast: Secondary | ICD-10-CM | POA: Diagnosis not present

## 2021-05-31 ENCOUNTER — Other Ambulatory Visit: Payer: Self-pay | Admitting: Internal Medicine

## 2021-05-31 DIAGNOSIS — I152 Hypertension secondary to endocrine disorders: Secondary | ICD-10-CM

## 2021-05-31 DIAGNOSIS — E1159 Type 2 diabetes mellitus with other circulatory complications: Secondary | ICD-10-CM

## 2021-06-01 NOTE — Telephone Encounter (Signed)
Requested Prescriptions  Pending Prescriptions Disp Refills   lisinopril-hydrochlorothiazide (ZESTORETIC) 20-12.5 MG tablet [Pharmacy Med Name: LISINOPRIL-HCTZ 20/12.5MG TABLETS] 90 tablet 1    Sig: TAKE 1 TABLET BY MOUTH DAILY     Cardiovascular:  ACEI + Diuretic Combos Failed - 05/31/2021  5:03 PM      Failed - Last BP in normal range    BP Readings from Last 1 Encounters:  03/17/21 (!) 146/84         Passed - Na in normal range and within 180 days    Sodium  Date Value Ref Range Status  12/30/2020 137 135 - 145 mmol/L Final  09/29/2020 140 134 - 144 mmol/L Final         Passed - K in normal range and within 180 days    Potassium  Date Value Ref Range Status  12/30/2020 3.7 3.5 - 5.1 mmol/L Final         Passed - Cr in normal range and within 180 days    Creat  Date Value Ref Range Status  07/29/2015 0.75 0.50 - 0.99 mg/dL Final   Creatinine, Ser  Date Value Ref Range Status  12/30/2020 0.92 0.44 - 1.00 mg/dL Final   Creatinine, POC  Date Value Ref Range Status  12/09/2017 50 mg/dL Final   Creatinine, Urine  Date Value Ref Range Status  11/16/2012 110.4 mg/dL Final         Passed - eGFR is 30 or above and within 180 days    GFR, Est African American  Date Value Ref Range Status  12/13/2014 71 >=60 mL/min Final   GFR calc Af Amer  Date Value Ref Range Status  09/20/2019 68 >59 mL/min/1.73 Final    Comment:    **[facility] currently reports eGFR in compliance with the current**   recommendations of the [employer]. [facility] will   update reporting as new guidelines are published from the NKF-ASN   Task force.    GFR, Est Non African American  Date Value Ref Range Status  12/13/2014 61 >=60 mL/min Final    Comment:      The estimated GFR is a calculation valid for adults (>=35 years old) that uses the CKD-EPI algorithm to adjust for age and sex. It is   not to be used for children, pregnant women, hospitalized patients,    patients on  dialysis, or with rapidly changing kidney function. According to the NKDEP, eGFR >89 is normal, 60-89 shows mild impairment, 30-59 shows moderate impairment, 15-29 shows severe impairment and <15 is ESRD.      GFR, Estimated  Date Value Ref Range Status  12/30/2020 >60 >60 mL/min Final    Comment:    (NOTE) Calculated using the CKD-EPI Creatinine Equation (2021)    eGFR  Date Value Ref Range Status  09/29/2020 45 (L) >59 mL/min/1.73 Final         Passed - Patient is not pregnant      Passed - Valid encounter within last 6 months    Recent Outpatient Visits          2 months ago Type 2 diabetes mellitus without complication, with long-term current use of insulin ([place])   [place], Neoma Laming B, MD   8 months ago Type 2 diabetes mellitus without complication, with long-term current use of insulin [facility])   [place] Ladell Pier, MD   1 year ago Need for influenza vaccination  [place], RPH-CPP   1 year ago Type 2 diabetes mellitus without complication, with long-term current use of insulin [facility])   [place], Deborah B, MD   1 year ago Type 2 diabetes mellitus with hyperglycemia, without long-term current use of insulin [facility])   [place], MD      Future Appointments            In 1 month Wynetta Emery Dalbert Batman, MD [place]

## 2021-07-17 ENCOUNTER — Ambulatory Visit: Payer: Medicare Other | Attending: Internal Medicine | Admitting: Internal Medicine

## 2021-07-17 ENCOUNTER — Encounter: Payer: Self-pay | Admitting: Internal Medicine

## 2021-07-17 VITALS — BP 124/82 | HR 66 | Resp 16 | Wt 143.2 lb

## 2021-07-17 DIAGNOSIS — K644 Residual hemorrhoidal skin tags: Secondary | ICD-10-CM | POA: Diagnosis not present

## 2021-07-17 DIAGNOSIS — E1169 Type 2 diabetes mellitus with other specified complication: Secondary | ICD-10-CM

## 2021-07-17 DIAGNOSIS — R809 Proteinuria, unspecified: Secondary | ICD-10-CM | POA: Diagnosis not present

## 2021-07-17 DIAGNOSIS — I152 Hypertension secondary to endocrine disorders: Secondary | ICD-10-CM | POA: Diagnosis not present

## 2021-07-17 DIAGNOSIS — I8393 Asymptomatic varicose veins of bilateral lower extremities: Secondary | ICD-10-CM

## 2021-07-17 DIAGNOSIS — E1129 Type 2 diabetes mellitus with other diabetic kidney complication: Secondary | ICD-10-CM | POA: Diagnosis not present

## 2021-07-17 DIAGNOSIS — E785 Hyperlipidemia, unspecified: Secondary | ICD-10-CM | POA: Diagnosis not present

## 2021-07-17 DIAGNOSIS — E1159 Type 2 diabetes mellitus with other circulatory complications: Secondary | ICD-10-CM | POA: Diagnosis not present

## 2021-07-17 DIAGNOSIS — Z794 Long term (current) use of insulin: Secondary | ICD-10-CM | POA: Diagnosis not present

## 2021-07-17 LAB — GLUCOSE, POCT (MANUAL RESULT ENTRY): POC Glucose: 145 mg/dl — AB (ref 70–99)

## 2021-07-17 LAB — POCT GLYCOSYLATED HEMOGLOBIN (HGB A1C): HbA1c, POC (controlled diabetic range): 7.4 % — AB (ref 0.0–7.0)

## 2021-07-17 MED ORDER — ONETOUCH VERIO VI STRP: ORAL_STRIP | 3 refills | Status: DC

## 2021-07-17 MED ORDER — LANTUS SOLOSTAR 100 UNIT/ML ~~LOC~~ SOPN: PEN_INJECTOR | SUBCUTANEOUS | 5 refills | Status: DC

## 2021-07-17 MED ORDER — ONETOUCH DELICA LANCETS 33G MISC: 12 refills | Status: DC

## 2021-07-17 MED ORDER — LISINOPRIL-HYDROCHLOROTHIAZIDE 20-12.5 MG PO TABS: 1.0000 | ORAL_TABLET | Freq: Every day | ORAL | 3 refills | Status: DC

## 2021-07-17 MED ORDER — METOPROLOL SUCCINATE ER 50 MG PO TB24: ORAL_TABLET | ORAL | 1 refills | Status: DC

## 2021-07-17 NOTE — Patient Instructions (Signed)
Purchase and wear compression socks during the day. ? ?

## 2021-07-17 NOTE — Progress Notes (Signed)
? ? ?Patient ID: Cindy Taylor, female    DOB: 01-May-1945  MRN: [id number] ? ?CC: Diabetes and Hypertension ? ? ?Subjective: ?Cindy Taylor is a 76 y.o. female who presents for chronic ds management ?Her concerns today include:  ?Pt with DM, HTN, HL, Hep C treated with Harvoni, snuff user, CKD 2-3 ? ?HYPERTENSION ?Currently taking: see medication list.  She is on lisinopril/HCTZ 20/12.5 mg daily, metoprolol 50 mg daily and Norvasc which was added on last visit.  She took her medications already for the morning ?Med Adherence: '[x]'  Yes    ?Medication side effects: feels she can do with this ?Adherence with salt restriction: '[x]'  Yes     ?Home Monitoring?: '[]'  Yes    '[x]'  No, no device to check BP ?Monitoring Frequency:  ?Home BP results range:  ?SOB? '[]'  Yes    '[x]'  No ?Chest Pain?: '[]'  Yes    '[x]'  No ?Leg swelling?: '[]'  Yes    '[x]'  No but varicose veins prominent on lower legs.  I had looked at them on last visit.  I had recommended purchasing some compression socks and wearing them during the day when she is up and active.  No pain in the varicose veins at this time.  She never did purchase the compression socks.   ?Headaches?: '[]'  Yes    '[x]'  No ?Dizziness? '[x]'  Yes -occasionally if she bends over   '[]'  No ?Comments:  ?  ?DIABETES TYPE 2 ?Last A1C:   ?Results for orders placed or performed in visit on 07/17/21  ?POCT glycosylated hemoglobin (Hb A1C)  ?Result Value Ref Range  ? Hemoglobin A1C    ? HbA1c POC (<> result, manual entry)    ? HbA1c, POC (prediabetic range)    ? HbA1c, POC (controlled diabetic range) 7.4 (A) 0.0 - 7.0 %  ?POCT glucose (manual entry)  ?Result Value Ref Range  ? POC Glucose 145 (A) 70 - 99 mg/dl  ?A1c increased by one-point since last visit. ?Med Adherence:  '[x]'  Yes Lantus 10 units daily and Metformin 1 gram BID   '[]'  No ?Medication side effects:  '[]'  Yes    '[x]'  No ?Home Monitoring?  '[x]'  Yes but out of supplies x 1 mth    '[]'  No ?Home glucose results range: ?Diet Adherence: '[x]'  Yes - staying  away from sugary drinks and junk snack. Does most of her cooking ?Exercise: '[x]'  Yes -does a lot of walking at work ?Hypoglycemic episodes?: '[]'  Yes    '[x]'  No ?Numbness of the feet? '[]'  Yes    '[x]'  No ?Retinopathy hx? '[]'  Yes    '[]'  No ?Last eye exam: Scheduled 07/21/2021 Dr. Schuyler Amor ?Comments: gained 8 lbs since last visit ? ?HL:  taking and tolerating Pravastatin ? ?C/o having a "bump" on rectum that appeared 8 mths ago.  She was constipated at the time and was having to strain.   ?It has increased in size. Not painful and does not bleed.  Stools have since been soft. ? ?HM: declines Shingrix ?Patient Active Problem List  ? Diagnosis Date Noted  ? Microalbuminuria 09/30/2020  ? Unintentional weight loss 10/22/2016  ? Chews tobacco 01/13/2016  ? Onychomycosis of multiple toenails with type 2 diabetes mellitus ([place]) 01/13/2016  ? History of hepatitis C 05/23/2014  ? DM type 2 (diabetes mellitus, type 2) ([place]) 11/16/2012  ? Hyperlipidemia associated with type 2 diabetes mellitus ([place]) 11/16/2012  ? Essential hypertension, benign 12/12/2008  ?  ? ?Current Outpatient Medications on File Prior to Visit  ?  Medication Sig Dispense Refill  ? amLODipine (NORVASC) 2.5 MG tablet Take 1 tablet (2.5 mg total) by mouth daily. 90 tablet 2  ? aspirin 81 MG tablet Take 1 tablet (81 mg total) by mouth daily. 90 tablet 3  ? Blood Glucose Monitoring Suppl (ONETOUCH VERIO) w/Device KIT Check blood sugars once daily 1 kit 0  ? Calcium Citrate-Vitamin D 250-200 MG-UNIT TABS Take 1 tablet by mouth 2 (two) times daily with a meal.    ? Insulin Pen Needle (B-D ULTRAFINE III SHORT PEN) 31G X 8 MM MISC USE AS DIRECTED WITH INSULIN PEN 100 each 6  ? LANTUS SOLOSTAR 100 UNIT/ML Solostar Pen INJECT 10 UNITS UNDER THE SKIN DAILY 15 mL 2  ? lisinopril-hydrochlorothiazide (ZESTORETIC) 20-12.5 MG tablet TAKE 1 TABLET BY MOUTH DAILY 90 tablet 0  ? metFORMIN (GLUCOPHAGE) 1000 MG tablet TAKE 1 TABLET(1000 MG) BY MOUTH TWICE DAILY WITH A MEAL 180 tablet 1  ?  metoprolol succinate (TOPROL-XL) 50 MG 24 hr tablet TAKE 1 AND 1/2 TABLETS(75 MG) BY MOUTH DAILY 135 tablet 1  ? OneTouch Delica Lancets 70J MISC Check blood sugar fasting and before meals and again if pt feels bad (symptoms of hypo). 100 each 12  ? ONETOUCH VERIO test strip USE AS DIRECTED ONCE A DAY 100 strip 3  ? pravastatin (PRAVACHOL) 40 MG tablet TAKE 1 TABLET(20 MG) BY MOUTH DAILY 30 tablet 6  ? ?No current facility-administered medications on file prior to visit.  ? ? ?No Known Allergies ? ?Social History  ? ?Socioeconomic History  ? Marital status: Single  ?  Spouse name: Not on file  ? Number of children: Not on file  ? Years of education: Not on file  ? Highest education level: Not on file  ?Occupational History  ? Not on file  ?Tobacco Use  ? Smoking status: Never  ? Smokeless tobacco: Current  ?  Types: Snuff  ?Substance and Sexual Activity  ? Alcohol use: No  ?  Comment: glass of wine at special occasion  ? Drug use: No  ? Sexual activity: Not Currently  ?Other Topics Concern  ? Not on file  ?Social History Narrative  ? Not on file  ? ?Social Determinants of Health  ? ?Financial Resource Strain: Low Risk   ? Difficulty of Paying Living Expenses: Not hard at all  ?Food Insecurity: No Food Insecurity  ? Worried About Charity fundraiser in the Last Year: Never true  ? Ran Out of Food in the Last Year: Never true  ?Transportation Needs: No Transportation Needs  ? Lack of Transportation (Medical): No  ? Lack of Transportation (Non-Medical): No  ?Physical Activity: Inactive  ? Days of Exercise per Week: 0 days  ? Minutes of Exercise per Session: 0 min  ?Stress: No Stress Concern Present  ? Feeling of Stress : Not at all  ?Social Connections: Unknown  ? Frequency of Communication with Friends and Family: More than three times a week  ? Frequency of Social Gatherings with Friends and Family: More than three times a week  ? Attends Religious Services: More than 4 times per year  ? Active Member of Clubs or  Organizations: Yes  ? Attends Archivist Meetings: More than 4 times per year  ? Marital Status: Patient refused  ?Intimate Partner Violence: Not At Risk  ? Fear of Current or Ex-Partner: No  ? Emotionally Abused: No  ? Physically Abused: No  ? Sexually Abused: No  ? ? ?Family  History  ?Problem Relation Age of Onset  ? Stroke Mother   ? Rectal cancer Mother   ? Colon polyps Neg Hx   ? Colon cancer Neg Hx   ? Esophageal cancer Neg Hx   ? Stomach cancer Neg Hx   ? ? ?Past Surgical History:  ?Procedure Laterality Date  ? TOTAL ABDOMINAL HYSTERECTOMY    ? TUBAL LIGATION    ? ? ?ROS: ?Review of Systems ?Negative except as stated above ? ?PHYSICAL EXAM: ?BP 124/82   Pulse 66   Resp 16   Wt 143 lb 3.2 oz (65 kg)   SpO2 100%   BMI 24.58 kg/m?   ?Wt Readings from Last 3 Encounters:  ?07/17/21 143 lb 3.2 oz (65 kg)  ?03/17/21 135 lb 12.8 oz (61.6 kg)  ?11/17/20 136 lb 3.2 oz (61.8 kg)  ? ? ?Physical Exam ? ?General appearance - alert, well appearing, and in no distress ?Mental status - normal mood, behavior, speech, dress, motor activity, and thought processes ?Neck - supple, no significant adenopathy ?Chest - clear to auscultation, no wheezes, rales or rhonchi, symmetric air entry ?Heart - normal rate, regular rhythm, normal S1, S2, no murmurs, rubs, clicks or gallops ?Rectal -no external hemorrhoids noted.  No prolapsing hemorrhoids noted.  She has a noninflamed skin tag located between the 4:00 and 6 o'clock position.  This is the lump that she is referring to.  It is nontender to touch. ?Extremities - peripheral pulses normal, no pedal edema, no clubbing or cyanosis.  She has noticeable varicose veins in both lower legs.  Veins are not inflamed and nontender to touch. ? ? ? ?  Latest Ref Rng & Units 12/30/2020  ? 11:47 AM 09/29/2020  ?  4:46 PM 09/20/2019  ?  4:36 PM  ?CMP  ?Glucose 70 - 99 mg/dL 172   84   151    ?BUN 8 - 23 mg/dL '11   16   12    ' ?Creatinine 0.44 - 1.00 mg/dL 0.92   1.27   0.97    ?Sodium 135  - 145 mmol/L 137   140   140    ?Potassium 3.5 - 5.1 mmol/L 3.7   4.8   4.0    ?Chloride 98 - 111 mmol/L 102   102   101    ?CO2 22 - 32 mmol/L '26   25   26    ' ?Calcium 8.9 - 10.3 mg/dL 9.2   9.7   9.7    ?Total Pro

## 2021-07-21 DIAGNOSIS — H0102A Squamous blepharitis right eye, upper and lower eyelids: Secondary | ICD-10-CM | POA: Diagnosis not present

## 2021-07-21 DIAGNOSIS — H04123 Dry eye syndrome of bilateral lacrimal glands: Secondary | ICD-10-CM | POA: Diagnosis not present

## 2021-07-21 DIAGNOSIS — H35033 Hypertensive retinopathy, bilateral: Secondary | ICD-10-CM | POA: Diagnosis not present

## 2021-07-21 DIAGNOSIS — H5203 Hypermetropia, bilateral: Secondary | ICD-10-CM | POA: Diagnosis not present

## 2021-07-21 DIAGNOSIS — E119 Type 2 diabetes mellitus without complications: Secondary | ICD-10-CM | POA: Diagnosis not present

## 2021-07-21 DIAGNOSIS — H52223 Regular astigmatism, bilateral: Secondary | ICD-10-CM | POA: Diagnosis not present

## 2021-07-21 DIAGNOSIS — H25813 Combined forms of age-related cataract, bilateral: Secondary | ICD-10-CM | POA: Diagnosis not present

## 2021-07-21 DIAGNOSIS — H524 Presbyopia: Secondary | ICD-10-CM | POA: Diagnosis not present

## 2021-07-21 DIAGNOSIS — H0102B Squamous blepharitis left eye, upper and lower eyelids: Secondary | ICD-10-CM | POA: Diagnosis not present

## 2021-07-29 ENCOUNTER — Encounter: Payer: Self-pay | Admitting: Internal Medicine

## 2021-09-07 ENCOUNTER — Other Ambulatory Visit: Payer: Self-pay | Admitting: Internal Medicine

## 2021-09-07 DIAGNOSIS — E1159 Type 2 diabetes mellitus with other circulatory complications: Secondary | ICD-10-CM

## 2021-09-09 NOTE — Telephone Encounter (Signed)
Refills available. [facility] 07/17/21  #90  3 refills  Requested Prescriptions  Pending Prescriptions Disp Refills  . lisinopril-hydrochlorothiazide (ZESTORETIC) 20-12.5 MG tablet [Pharmacy Med Name: LISINOPRIL-HCTZ 20/12.5MG TABLETS] 90 tablet 3    Sig: TAKE 1 TABLET BY MOUTH DAILY     Cardiovascular:  ACEI + Diuretic Combos Failed - 09/07/2021  5:05 PM      Failed - Na in normal range and within 180 days    Sodium  Date Value Ref Range Status  12/30/2020 137 135 - 145 mmol/L Final  09/29/2020 140 134 - 144 mmol/L Final         Failed - K in normal range and within 180 days    Potassium  Date Value Ref Range Status  12/30/2020 3.7 3.5 - 5.1 mmol/L Final         Failed - Cr in normal range and within 180 days    Creat  Date Value Ref Range Status  07/29/2015 0.75 0.50 - 0.99 mg/dL Final   Creatinine, Ser  Date Value Ref Range Status  12/30/2020 0.92 0.44 - 1.00 mg/dL Final   Creatinine, POC  Date Value Ref Range Status  12/09/2017 50 mg/dL Final   Creatinine, Urine  Date Value Ref Range Status  11/16/2012 110.4 mg/dL Final         Failed - eGFR is 30 or above and within 180 days    GFR, Est African American  Date Value Ref Range Status  12/13/2014 71 >=60 mL/min Final   GFR calc Af Amer  Date Value Ref Range Status  09/20/2019 68 >59 mL/min/1.73 Final    Comment:    **[facility] currently reports eGFR in compliance with the current**   recommendations of the [employer]. [facility] will   update reporting as new guidelines are published from the NKF-ASN   Task force.    GFR, Est Non African American  Date Value Ref Range Status  12/13/2014 61 >=60 mL/min Final    Comment:      The estimated GFR is a calculation valid for adults (>=21 years old) that uses the CKD-EPI algorithm to adjust for age and sex. It is   not to be used for children, pregnant women, hospitalized patients,    patients on dialysis, or with rapidly changing kidney function. According  to the NKDEP, eGFR >89 is normal, 60-89 shows mild impairment, 30-59 shows moderate impairment, 15-29 shows severe impairment and <15 is ESRD.      GFR, Estimated  Date Value Ref Range Status  12/30/2020 >60 >60 mL/min Final    Comment:    (NOTE) Calculated using the CKD-EPI Creatinine Equation (2021)    eGFR  Date Value Ref Range Status  09/29/2020 45 (L) >59 mL/min/1.73 Final         Passed - Patient is not pregnant      Passed - Last BP in normal range    BP Readings from Last 1 Encounters:  07/17/21 124/82         Passed - Valid encounter within last 6 months    Recent Outpatient Visits          1 month ago Type 2 diabetes mellitus with microalbuminuria, with long-term current use of insulin ([place])   [place], Neoma Laming B, MD   5 months ago Type 2 diabetes mellitus without complication, with long-term current use of insulin [facility])   [place] [facility] Ladell Pier, MD  11 months ago Type 2 diabetes mellitus without complication, with long-term current use of insulin ([place])   [place] Ladell Pier, MD   1 year ago Need for influenza vaccination   [place], RPH-CPP   1 year ago Type 2 diabetes mellitus without complication, with long-term current use of insulin [facility])   [place] [facility] Ladell Pier, MD

## 2021-09-10 ENCOUNTER — Other Ambulatory Visit: Payer: Self-pay | Admitting: Pharmacist

## 2021-09-10 DIAGNOSIS — E119 Type 2 diabetes mellitus without complications: Secondary | ICD-10-CM

## 2021-09-10 MED ORDER — METFORMIN HCL 1000 MG PO TABS: ORAL_TABLET | ORAL | 0 refills | Status: DC

## 2021-10-08 ENCOUNTER — Encounter: Payer: Self-pay | Admitting: Physician Assistant

## 2021-10-08 ENCOUNTER — Ambulatory Visit: Payer: Medicare Other | Attending: Physician Assistant | Admitting: Physician Assistant

## 2021-10-08 VITALS — BP 115/75 | HR 72 | Wt 142.6 lb

## 2021-10-08 DIAGNOSIS — Z794 Long term (current) use of insulin: Secondary | ICD-10-CM

## 2021-10-08 DIAGNOSIS — K644 Residual hemorrhoidal skin tags: Secondary | ICD-10-CM | POA: Diagnosis not present

## 2021-10-08 DIAGNOSIS — E119 Type 2 diabetes mellitus without complications: Secondary | ICD-10-CM

## 2021-10-08 LAB — GLUCOSE, POCT (MANUAL RESULT ENTRY): POC Glucose: 178 mg/dl — AB (ref 70–99)

## 2021-10-13 ENCOUNTER — Other Ambulatory Visit: Payer: Self-pay | Admitting: Internal Medicine

## 2021-10-13 DIAGNOSIS — E1169 Type 2 diabetes mellitus with other specified complication: Secondary | ICD-10-CM

## 2021-11-17 ENCOUNTER — Ambulatory Visit: Payer: Medicare Other | Attending: Internal Medicine | Admitting: Pharmacist

## 2021-11-17 ENCOUNTER — Encounter: Payer: Self-pay | Admitting: Pharmacist

## 2021-11-17 VITALS — BP 91/57 | HR 75 | Temp 98.6°F | Ht 64.0 in | Wt 142.4 lb

## 2021-11-17 DIAGNOSIS — Z0000 Encounter for general adult medical examination without abnormal findings: Secondary | ICD-10-CM

## 2021-11-17 NOTE — Progress Notes (Signed)
Subjective:   Cindy Taylor is a 76 y.o. female who presents for Medicare Annual (Subsequent) preventive examination.      Objective:    Today's Vitals   11/17/21 1002 11/17/21 1003  BP:  (!) 91/57  Pulse:  75  Temp:  98.6 F (37 C)  SpO2:  99%  Weight:  142 lb 6.4 oz (64.6 kg)  Height:  _0  (1.626 m)  PainSc: 0-No pain 0-No pain   Body mass index is 24.44 kg/m.     11/17/2021   10:08 AM 12/30/2020   10:53 AM 11/17/2020    4:48 PM 05/20/2017   10:56 AM 03/01/2017    3:49 PM 01/24/2017    3:43 PM 10/22/2016    4:31 PM  Advanced Directives  Does Patient Have a Medical Advance Directive? _1  No No  Would patient like information on creating a medical advance directive? No - Patient declined No - Patient declined No - Patient declined   No - Patient declined     Current Medications (verified) Outpatient Encounter Medications as of 11/17/2021  Medication Sig   amLODipine (NORVASC) 2.5 MG tablet Take 1 tablet (2.5 mg total) by mouth daily.   aspirin 81 MG tablet Take 1 tablet (81 mg total) by mouth daily.   Blood Glucose Monitoring Suppl (ONETOUCH VERIO) w/Device KIT Check blood sugars once daily   Calcium Citrate-Vitamin D 250-200 MG-UNIT TABS Take 1 tablet by mouth 2 (two) times daily with a meal.   glucose blood (ONETOUCH VERIO) test strip USE AS DIRECTED ONCE A DAY   insulin glargine (LANTUS SOLOSTAR) 100 UNIT/ML Solostar Pen INJECT 10 UNITS UNDER THE SKIN DAILY   Insulin Pen Needle (B-D ULTRAFINE III SHORT PEN) 31G X 8 MM MISC USE AS DIRECTED WITH INSULIN PEN   lisinopril-hydrochlorothiazide (ZESTORETIC) 20-12.5 MG tablet Take 1 tablet by mouth daily.   metFORMIN (GLUCOPHAGE) 1000 MG tablet TAKE 1 TABLET(1000 MG) BY MOUTH TWICE DAILY WITH A MEAL   metoprolol succinate (TOPROL-XL) 50 MG 24 hr tablet TAKE 1 AND 1/2 TABLETS(75 MG) BY MOUTH DAILY   OneTouch Delica Lancets 21R MISC Check blood sugar fasting and before meals and again if pt feels bad (symptoms  of hypo).   pravastatin (PRAVACHOL) 40 MG tablet TAKE 1 TABLET BY MOUTH EVERY DAY   No facility-administered encounter medications on file as of 11/17/2021.    Allergies (verified) Patient has no known allergies.   History: Past Medical History:  Diagnosis Date   Arthritis    Combined senile cataract    Diabetes mellitus    mellitus? x 9 years   Hepatitis C    HTN (hypertension)    x8 years   Hyperlipidemia    Hypertensive retinopathy of both eyes    Past Surgical History:  Procedure Laterality Date   TOTAL ABDOMINAL HYSTERECTOMY     TUBAL LIGATION     Family History  Problem Relation Age of Onset   Stroke Mother    Rectal cancer Mother    Colon polyps Neg Hx    Colon cancer Neg Hx    Esophageal cancer Neg Hx    Stomach cancer Neg Hx    Social History   Socioeconomic History   Marital status: Single    Spouse name: Not on file   Number of children: Not on file   Years of education: Not on file   Highest education level: Not on file  Occupational History   Not on file  Tobacco Use   Smoking status: Never   Smokeless tobacco: Current    Types: Snuff  Substance and Sexual Activity   Alcohol use: No    Comment: glass of wine at special occasion   Drug use: No   Sexual activity: Not Currently  Other Topics Concern   Not on file  Social History Narrative   Not on file   Social Determinants of Health   Financial Resource Strain: Low Risk  (11/17/2020)   Overall Financial Resource Strain (CARDIA)    Difficulty of Paying Living Expenses: Not hard at all  Food Insecurity: No Food Insecurity (11/17/2020)   Hunger Vital Sign    Worried About Running Out of Food in the Last Year: Never true    [place] in the Last Year: Never true  Transportation Needs: No Transportation Needs (11/17/2020)   PRAPARE - Hydrologist (Medical): No    Lack of Transportation (Non-Medical): No  Physical Activity: Inactive (11/17/2020)   Exercise Vital  Sign    Days of Exercise per Week: 0 days    Minutes of Exercise per Session: 0 min  Stress: No Stress Concern Present (11/17/2020)   [place]    Feeling of Stress : Not at all  Social Connections: Unknown (11/17/2020)   Social Connection and Isolation Panel [NHANES]    Frequency of Communication with Friends and Family: More than three times a week    Frequency of Social Gatherings with Friends and Family: More than three times a week    Attends Religious Services: More than 4 times per year    Active Member of [employer] or Organizations: Yes    Attends Music therapist: More than 4 times per year    Marital Status: Patient refused    Tobacco Counseling Ready to quit: No Counseling given: Yes   Clinical Intake:  Pre-visit preparation completed: No  Pain : No/denies pain Pain Score: 0-No pain     Diabetes: No CBG done?: No (117 at home)  How often do you need to have someone help you when you read instructions, pamphlets, or other written materials from your doctor or pharmacy?: 1 - Never What is the last grade level you completed in school?: 9th  Diabetic?Yes   Interpreter Needed?: No  Information entered by :: [place]   Activities of Daily Living    11/17/2021   10:10 AM  In your present state of health, do you have any difficulty performing the following activities:  Hearing? 0  Vision? 0  Comment 02/2021 - Dr. Katy Fitch  Difficulty concentrating or making decisions? 0  Walking or climbing stairs? 0  Dressing or bathing? 0  Doing errands, shopping? 0  Preparing Food and eating ? N  Using the Toilet? N  In the past six months, have you accidently leaked urine? N  Do you have problems with loss of bowel control? N  Managing your Medications? N  Managing your Finances? N  Housekeeping or managing your Housekeeping? N    Patient Care Team: Ladell Pier, MD as PCP - General (Internal  Medicine)  Indicate any recent Medical Services you may have received from other than [facility] providers in the past year (date may be approximate).     Assessment:   This is a routine wellness examination for Cindy Taylor.  Hearing/Vision screen No results found.  Dietary issues and exercise activities discussed: Current Exercise Habits: Home exercise  routine, Intensity: Mild (Active at work), Exercise limited by: None identified   Goals Addressed   None   Depression Screen    11/17/2021   10:09 AM 10/08/2021    9:29 AM 07/17/2021    9:27 AM 03/17/2021   10:07 AM 11/17/2020    4:49 PM 09/29/2020    3:50 PM 12/06/2019    1:49 PM  PHQ 2/9 Scores  PHQ - 2 Score 0 2 0 0 0 0 0  PHQ- 9 Score  4         Fall Risk    11/17/2021   10:09 AM 10/08/2021    9:29 AM 03/17/2021   10:07 AM 11/17/2020    4:49 PM 12/06/2019    1:49 PM  Fall Risk   Falls in the past year? 0 0 0 0 0  Number falls in past yr: 0 0 0 0 0  Injury with Fall? 0 0 0 0 0  Risk for fall due to :  No Fall Risks No Fall Risks No Fall Risks   Follow up    Falls evaluation completed;Education provided;Falls prevention discussed     FALL RISK PREVENTION PERTAINING TO THE HOME:  Any stairs in or around the home? No  If so, are there any without handrails? No  Home free of loose throw rugs in walkways, pet beds, electrical cords, etc? Yes  Adequate lighting in your home to reduce risk of falls? Yes   ASSISTIVE DEVICES UTILIZED TO PREVENT FALLS:  Life alert? No  Use of a cane, walker or w/c? No  Grab bars in the bathroom? Yes  Shower chair or bench in shower? No  Elevated toilet seat or a handicapped toilet? No   TIMED UP AND GO:  Was the test performed? Yes .  Length of time to ambulate 10 feet: 6 sec.   Gait slow and steady without use of assistive device  Cognitive Function:    11/17/2021   10:13 AM 11/17/2020    4:49 PM  MMSE - Mini Mental State Exam  Orientation to time 5 5  Orientation to Place 5 5   Registration 3 3  Attention/ Calculation 5 5  Recall 3 1  Language- name 2 objects 2 2  Language- repeat 1 1  Language- follow 3 step command 3 3  Language- read & follow direction 1 1  Write a sentence 1 1  Copy design 1 1  Total score 30 28        Immunizations Immunization History  Administered Date(s) Administered   Influenza Split 05/10/2012   Influenza,inj,Quad PF,6+ Mos 02/16/2013, 01/17/2014, 01/13/2016, 01/25/2017, 12/09/2017, 02/15/2019, 12/06/2019, 03/17/2021   Pneumococcal Conjugate-13 01/17/2014   Pneumococcal Polysaccharide-23 07/24/2013   Tdap 01/13/2016    TDAP status: Up to date  Flu Vaccine status: Up to date  Pneumococcal vaccine status: Up to date  Covid-19 vaccine status: Completed vaccines  Qualifies for Shingles Vaccine? Yes   Zostavax completed No   Shingrix Completed?: No.    Education has been provided regarding the importance of this vaccine. Patient has been advised to call insurance company to determine out of pocket expense if they have not yet received this vaccine. Advised may also receive vaccine at local pharmacy or Health Dept. Verbalized acceptance and understanding.  Screening Tests Health Maintenance  Topic Date Due   COVID-19 Vaccine (1) Never done   Zoster Vaccines- Shingrix (1 of 2) Never done   FOOT EXAM  09/29/2021   INFLUENZA VACCINE  11/17/2021  HEMOGLOBIN A1C  01/16/2022   COLONOSCOPY (Pts 45-39yr Insurance coverage will need to be confirmed)  05/20/2022   OPHTHALMOLOGY EXAM  07/22/2022   MAMMOGRAM  05/08/2023   TETANUS/TDAP  01/12/2026   Pneumonia Vaccine 76 Years old  Completed   DEXA SCAN  Completed   Hepatitis C Screening  Completed   HPV VACCINES  Aged Out    Health Maintenance  Health Maintenance Due  Topic Date Due   COVID-19 Vaccine (1) Never done   Zoster Vaccines- Shingrix (1 of 2) Never done   FOOT EXAM  09/29/2021   INFLUENZA VACCINE  11/17/2021    Colorectal cancer screening: Type of  screening: Colonoscopy. Completed 05/20/2017. Repeat every 5 years  Mammogram status: Completed 05/07/2021. Repeat every year  Bone Density status: Completed 2020. Results reflect: Bone density results: OSTEOPENIA. Repeat every 3 years.  Lung Cancer Screening: (Low Dose CT Chest recommended if Age 325-80years, 30 pack-year currently smoking OR have quit w/in 15years.) does not qualify.   Lung Cancer Screening Referral: not needed at this time.   Additional Screening:  Hepatitis C Screening: does not qualify; Completed already - 03/17/2021.   Vision Screening: Recommended annual ophthalmology exams for early detection of glaucoma and other disorders of the eye. Is the patient up to date with their annual eye exam?  Yes  Who is the provider or what is the name of the office in which the patient attends annual eye exams? Dr. GKaty Fitch  Dental Screening: Recommended annual dental exams for proper oral hygiene  Community Resource Referral / Chronic Care Management: CRR required this visit?  No   CCM required this visit?  No      Plan:     I have personally reviewed and noted the following in the patient's chart:   Medical and social history Use of alcohol, tobacco or illicit drugs  Current medications and supplements including opioid prescriptions.  Functional ability and status Nutritional status Physical activity Advanced directives List of other physicians Hospitalizations, surgeries, and ER visits in previous 12 months Vitals Screenings to include cognitive, depression, and falls Referrals and appointments  In addition, I have reviewed and discussed with patient certain preventive protocols, quality metrics, and best practice recommendations. A written personalized care plan for preventive services as well as general preventive health recommendations were provided to patient.     STresa Endo RPH-CPP   11/17/2021

## 2021-12-06 ENCOUNTER — Other Ambulatory Visit: Payer: Self-pay | Admitting: Internal Medicine

## 2021-12-06 DIAGNOSIS — E119 Type 2 diabetes mellitus without complications: Secondary | ICD-10-CM

## 2021-12-08 DIAGNOSIS — K644 Residual hemorrhoidal skin tags: Secondary | ICD-10-CM | POA: Diagnosis not present

## 2021-12-18 ENCOUNTER — Other Ambulatory Visit: Payer: Self-pay | Admitting: Internal Medicine

## 2021-12-18 DIAGNOSIS — I152 Hypertension secondary to endocrine disorders: Secondary | ICD-10-CM

## 2021-12-25 ENCOUNTER — Encounter: Payer: Self-pay | Admitting: Internal Medicine

## 2021-12-25 ENCOUNTER — Ambulatory Visit: Payer: Medicare Other | Attending: Internal Medicine | Admitting: Internal Medicine

## 2021-12-25 VITALS — BP 113/70 | HR 76 | Ht 65.0 in | Wt 142.0 lb

## 2021-12-25 DIAGNOSIS — Z23 Encounter for immunization: Secondary | ICD-10-CM | POA: Diagnosis not present

## 2021-12-25 DIAGNOSIS — E785 Hyperlipidemia, unspecified: Secondary | ICD-10-CM

## 2021-12-25 DIAGNOSIS — I152 Hypertension secondary to endocrine disorders: Secondary | ICD-10-CM

## 2021-12-25 DIAGNOSIS — Z794 Long term (current) use of insulin: Secondary | ICD-10-CM

## 2021-12-25 DIAGNOSIS — L84 Corns and callosities: Secondary | ICD-10-CM | POA: Diagnosis not present

## 2021-12-25 DIAGNOSIS — E1129 Type 2 diabetes mellitus with other diabetic kidney complication: Secondary | ICD-10-CM

## 2021-12-25 DIAGNOSIS — E1169 Type 2 diabetes mellitus with other specified complication: Secondary | ICD-10-CM | POA: Diagnosis not present

## 2021-12-25 DIAGNOSIS — E1159 Type 2 diabetes mellitus with other circulatory complications: Secondary | ICD-10-CM

## 2021-12-25 DIAGNOSIS — R809 Proteinuria, unspecified: Secondary | ICD-10-CM

## 2021-12-25 LAB — GLUCOSE, POCT (MANUAL RESULT ENTRY): POC Glucose: 182 mg/dl — AB (ref 70–99)

## 2021-12-25 LAB — POCT GLYCOSYLATED HEMOGLOBIN (HGB A1C): HbA1c, POC (controlled diabetic range): 6.5 % (ref 0.0–7.0)

## 2021-12-25 MED ORDER — LANTUS SOLOSTAR 100 UNIT/ML ~~LOC~~ SOPN: PEN_INJECTOR | SUBCUTANEOUS | 5 refills | Status: DC

## 2021-12-25 MED ORDER — ZOSTER VAC RECOMB ADJUVANTED 50 MCG/0.5ML IM SUSR: 0.5000 mL | Freq: Once | INTRAMUSCULAR | 0 refills | Status: AC

## 2021-12-25 NOTE — Progress Notes (Signed)
Pt requesting flu shot 

## 2021-12-25 NOTE — Progress Notes (Signed)
Patient ID: Cindy Taylor, female    DOB: July 23, 1945  MRN: [id number]  CC: Diabetes   Subjective: Cindy Taylor is a 76 y.o. female who presents for chronic ds management Her concerns today include:  Pt with DM, HTN, HL, Hep C treated with Harvoni, snuff user, CKD 2-3  DM: Results for orders placed or performed in visit on 12/25/21  POCT glucose (manual entry)  Result Value Ref Range   POC Glucose 182 (A) 70 - 99 mg/dl  POCT glycosylated hemoglobin (Hb A1C)  Result Value Ref Range   Hemoglobin A1C     HbA1c POC (<> result, manual entry)     HbA1c, POC (prediabetic range)     HbA1c, POC (controlled diabetic range) 6.5 0.0 - 7.0 %  A1C 6.5 Compliant with meds: Lantus 10 units and Metformin 1 gram BID Checking BS 2x/wk.  This a.m was 118. Gives range 115-120 Doing well with eating habits.  Very active at work.  Weight has remained stable.  HYPERTENSION Currently taking: see medication list.  She is on amlodipine 2.5 mg, Toprol-XL 50 mg and lisinopril/hydrochlorothiazide 20/12.5 mg daily. Med Adherence: [x] Yes    [] No Medication side effects: [] Yes    [x] No Adherence with salt restriction: [x] Yes    [] No Home Monitoring?: [] Yes    [x] No Monitoring Frequency:  Home BP results range:  SOB? [x] Yes  -sometimes when she is at work, [employer].  Chest Pain?: [] Yes    [x] No Leg swelling?: [] Yes    [x] No Headaches?: [] Yes    [x] No Dizziness? [] Yes    [x] No Comments:   HL:  taking and tolerating Pravachol  Saw Dr. Dema Severin about rectal tag.  Reports being told if it does not bother her to leave it alone.  Also given tips on how to keep bowel movements soft and regular.  HM: agrees for flu shot and shingles vaccine.    Patient Active Problem List   Diagnosis Date Noted   Skin tag of rectum 07/17/2021   Microalbuminuria 09/30/2020   Unintentional weight loss 10/22/2016   Chews tobacco 01/13/2016   Onychomycosis of multiple toenails with type 2  diabetes mellitus ([place]) 01/13/2016   History of hepatitis C 05/23/2014   DM type 2 (diabetes mellitus, type 2) ([place]) 11/16/2012   Hyperlipidemia associated with type 2 diabetes mellitus ([place]) 11/16/2012   Essential hypertension, benign 12/12/2008     Current Outpatient Medications on File Prior to Visit  Medication Sig Dispense Refill   amLODipine (NORVASC) 2.5 MG tablet TAKE 1 TABLET(2.5 MG) BY MOUTH DAILY 90 tablet 1   aspirin 81 MG tablet Take 1 tablet (81 mg total) by mouth daily. 90 tablet 3   Blood Glucose Monitoring Suppl (ONETOUCH VERIO) w/Device KIT Check blood sugars once daily 1 kit 0   Calcium Citrate-Vitamin D 250-200 MG-UNIT TABS Take 1 tablet by mouth 2 (two) times daily with a meal.     glucose blood (ONETOUCH VERIO) test strip USE AS DIRECTED ONCE A DAY 100 strip 3   Insulin Pen Needle (B-D ULTRAFINE III SHORT PEN) 31G X 8 MM MISC USE AS DIRECTED WITH INSULIN PEN 100 each 6   lisinopril-hydrochlorothiazide (ZESTORETIC) 20-12.5 MG tablet Take 1 tablet by mouth daily. 90 tablet 3   metFORMIN (GLUCOPHAGE) 1000 MG tablet TAKE 1 TABLET(1000 MG) BY MOUTH TWICE DAILY WITH A MEAL 180 tablet 0   metoprolol succinate (TOPROL-XL) 50 MG  24 hr tablet TAKE 1 AND 1/2 TABLETS(75 MG) BY MOUTH DAILY 135 tablet 1   OneTouch Delica Lancets 33G MISC Check blood sugar fasting and before meals and again if pt feels bad (symptoms of hypo). 100 each 12   pravastatin (PRAVACHOL) 40 MG tablet TAKE 1 TABLET BY MOUTH EVERY DAY 90 tablet 1   No current facility-administered medications on file prior to visit.    No Known Allergies  Social History   Socioeconomic History   Marital status: Single    Spouse name: Not on file   Number of children: Not on file   Years of education: Not on file   Highest education level: Not on file  Occupational History   Not on file  Tobacco Use   Smoking status: Never   Smokeless tobacco: Current    Types: Snuff  Substance and Sexual Activity   Alcohol use:  No    Comment: glass of wine at special occasion   Drug use: No   Sexual activity: Not Currently  Other Topics Concern   Not on file  Social History Narrative   Not on file   Social Determinants of Health   Financial Resource Strain: Low Risk  (11/17/2020)   Overall Financial Resource Strain (CARDIA)    Difficulty of Paying Living Expenses: Not hard at all  Food Insecurity: No Food Insecurity (11/17/2020)   Hunger Vital Sign    Worried About Running Out of Food in the Last Year: Never true    Ran Out of Food in the Last Year: Never true  Transportation Needs: No Transportation Needs (11/17/2020)   PRAPARE - Transportation    Lack of Transportation (Medical): No    Lack of Transportation (Non-Medical): No  Physical Activity: Inactive (11/17/2020)   Exercise Vital Sign    Days of Exercise per Week: 0 days    Minutes of Exercise per Session: 0 min  Stress: No Stress Concern Present (11/17/2020)   [employer] - Occupational Stress Questionnaire    Feeling of Stress : Not at all  Social Connections: Unknown (11/17/2020)   Social Connection and Isolation Panel [NHANES]    Frequency of Communication with Friends and Family: More than three times a week    Frequency of Social Gatherings with Friends and Family: More than three times a week    Attends Religious Services: More than 4 times per year    Active Member of Clubs or Organizations: Yes    Attends Club or Organization Meetings: More than 4 times per year    Marital Status: Patient refused  Intimate Partner Violence: Not At Risk (11/17/2020)   Humiliation, Afraid, Rape, and Kick questionnaire    Fear of Current or Ex-Partner: No    Emotionally Abused: No    Physically Abused: No    Sexually Abused: No    Family History  Problem Relation Age of Onset   Stroke Mother    Rectal cancer Mother    Colon polyps Neg Hx    Colon cancer Neg Hx    Esophageal cancer Neg Hx    Stomach cancer Neg Hx     Past  Surgical History:  Procedure Laterality Date   TOTAL ABDOMINAL HYSTERECTOMY     TUBAL LIGATION      ROS: Review of Systems Negative except as stated above  PHYSICAL EXAM: BP 113/70   Pulse 76   Ht 5' 5" (1.651 m)   Wt 142 lb (64.4 kg)   SpO2 98%     BMI 23.63 kg/m   Wt Readings from Last 3 Encounters:  12/25/21 142 lb (64.4 kg)  11/17/21 142 lb 6.4 oz (64.6 kg)  10/08/21 142 lb 9.6 oz (64.7 kg)    Physical Exam  General appearance - alert, well appearing, older African-American female and in no distress Mental status - normal mood, behavior, speech, dress, motor activity, and thought processes Neck - supple, no significant adenopathy Chest - clear to auscultation, no wheezes, rales or rhonchi, symmetric air entry Heart - normal rate, regular rhythm, normal S1, S2, no murmurs, rubs, clicks or gallops Extremities - peripheral pulses normal, no pedal edema, no clubbing or cyanosis Diabetic Foot Exam - Simple   Simple Foot Form Diabetic Foot exam was performed with the following findings: Yes 12/25/2021 10:58 AM  Visual Inspection See comments: Yes Sensation Testing Intact to touch and monofilament testing bilaterally: Yes Pulse Check Posterior Tibialis and Dorsalis pulse intact bilaterally: Yes Comments Corn on RT 4th and 5th toes and callous medial aspect big toe. Corn on LT 5th toe and at base of this toe Toenails thick and chip on both big toes.          Latest Ref Rng & Units 12/30/2020   11:47 AM 09/29/2020    4:46 PM 09/20/2019    4:36 PM  CMP  Glucose 70 - 99 mg/dL 172  84  151   BUN 8 - 23 mg/dL 11  16  12   Creatinine 0.44 - 1.00 mg/dL 0.92  1.27  0.97   Sodium 135 - 145 mmol/L 137  140  140   Potassium 3.5 - 5.1 mmol/L 3.7  4.8  4.0   Chloride 98 - 111 mmol/L 102  102  101   CO2 22 - 32 mmol/L 26  25  26   Calcium 8.9 - 10.3 mg/dL 9.2  9.7  9.7   Total Protein 6.0 - 8.5 g/dL  7.6    Total Bilirubin 0.0 - 1.2 mg/dL  0.2    Alkaline Phos 44 - 121 IU/L  56     AST 0 - 40 IU/L  14    ALT 0 - 32 IU/L  16     Lipid Panel     Component Value Date/Time   CHOL 169 09/29/2020 1646   TRIG 96 09/29/2020 1646   HDL 60 09/29/2020 1646   CHOLHDL 2.8 09/29/2020 1646   CHOLHDL 2.8 07/29/2015 0918   VLDL 16 07/29/2015 0918   LDLCALC 92 09/29/2020 1646   LDLDIRECT 71 12/03/2009 2014    CBC    Component Value Date/Time   WBC 4.6 12/30/2020 1147   RBC 4.92 12/30/2020 1147   HGB 13.1 12/30/2020 1147   HGB 13.1 09/04/2019 1651   HCT 40.4 12/30/2020 1147   HCT 40.2 09/04/2019 1651   PLT 187 12/30/2020 1147   PLT 201 09/04/2019 1651   MCV 82.1 12/30/2020 1147   MCV 83 09/04/2019 1651   MCH 26.6 12/30/2020 1147   MCHC 32.4 12/30/2020 1147   RDW 14.4 12/30/2020 1147   RDW 13.1 09/04/2019 1651   LYMPHSABS 3.9 07/24/2013 1741   MONOABS 0.6 07/24/2013 1741   EOSABS 0.1 07/24/2013 1741   BASOSABS 0.0 07/24/2013 1741    ASSESSMENT AND PLAN:  1. Type 2 diabetes mellitus with microalbuminuria, with long-term current use of insulin (HCC) At goal.  Advised to continue Lantus 10 units daily and metformin. Continue healthy eating habits and staying active. - POCT glucose (manual entry) - POCT   glycosylated hemoglobin (Hb A1C) - CBC - Comprehensive metabolic panel - Lipid panel - Microalbumin / creatinine urine ratio - insulin glargine (LANTUS SOLOSTAR) 100 UNIT/ML Solostar Pen; INJECT 10 UNITS UNDER THE SKIN DAILY  Dispense: 15 mL; Refill: 5  2. Hyperlipidemia associated with type 2 diabetes mellitus (HCC) Continue Pravachol.  Due for lipid profile today.  3. Hypertension associated with diabetes ([place]) At goal.  Continue medications listed above and low-salt diet.  4. Corn of toe Encourage good diabetic foot care  5. Need for shingles vaccine Prescription given for patient to get Shingrix vaccine at any outside pharmacy. - Zoster Vaccine Adjuvanted [facility]) injection; Inject 0.5 mLs into the muscle once for 1 dose.  Dispense: 0.5 mL;  Refill: 0  6. Need for immunization against influenza - Flu Vaccine QUAD 12moIM (Fluarix, Fluzone & Alfiuria Quad PF)    Patient was given the opportunity to ask questions.  Patient verbalized understanding of the plan and was able to repeat key elements of the plan.   This documentation was completed using DRadio producer  Any transcriptional errors are unintentional.  Orders Placed This Encounter  Procedures   Flu Vaccine QUAD 628moM (Fluarix, Fluzone & Alfiuria Quad PF)   CBC   Comprehensive metabolic panel   Lipid panel   Microalbumin / creatinine urine ratio   POCT glucose (manual entry)   POCT glycosylated hemoglobin (Hb A1C)     Requested Prescriptions   Signed Prescriptions Disp Refills   Zoster Vaccine Adjuvanted (SHINGRIX) injection 0.5 mL 0    Sig: Inject 0.5 mLs into the muscle once for 1 dose.   insulin glargine (LANTUS SOLOSTAR) 100 UNIT/ML Solostar Pen 15 mL 5    Sig: INJECT 10 UNITS UNDER THE SKIN DAILY    Return in about 4 months (around 04/26/2022).  DeKarle PlumberMD, FACP

## 2021-12-27 LAB — MICROALBUMIN / CREATININE URINE RATIO
Creatinine, Urine: 63.2 mg/dL
Microalb/Creat Ratio: <5 mg/g creat (ref 0–29)
Microalbumin, Urine: <3 ug/mL

## 2021-12-27 LAB — LIPID PANEL
Chol/HDL Ratio: 2.4 ratio (ref 0.0–4.4)
Cholesterol, Total: 158 mg/dL (ref 100–199)
HDL: 67 mg/dL (ref >39)
LDL Chol Calc (NIH): 78 mg/dL (ref 0–99)
Triglycerides: 63 mg/dL (ref 0–149)
VLDL Cholesterol Cal: 13 mg/dL (ref 5–40)

## 2021-12-27 LAB — CBC
Hematocrit: 41.2 % (ref 34.0–46.6)
Hemoglobin: 13.4 g/dL (ref 11.1–15.9)
MCH: 26.5 pg — ABNORMAL LOW (ref 26.6–33.0)
MCHC: 32.5 g/dL (ref 31.5–35.7)
MCV: 82 fL (ref 79–97)
Platelets: 204 10*3/uL (ref 150–450)
RBC: 5.05 x10E6/uL (ref 3.77–5.28)
RDW: 13.8 % (ref 11.7–15.4)
WBC: 5.4 10*3/uL (ref 3.4–10.8)

## 2021-12-27 LAB — COMPREHENSIVE METABOLIC PANEL
ALT: 12 IU/L (ref 0–32)
AST: 18 IU/L (ref 0–40)
Albumin/Globulin Ratio: 1.6 (ref 1.2–2.2)
Albumin: 4.7 g/dL (ref 3.8–4.8)
Alkaline Phosphatase: 57 IU/L (ref 44–121)
BUN/Creatinine Ratio: 17 (ref 12–28)
BUN: 13 mg/dL (ref 8–27)
Bilirubin Total: 0.3 mg/dL (ref 0.0–1.2)
CO2: 24 mmol/L (ref 20–29)
Calcium: 10.1 mg/dL (ref 8.7–10.3)
Chloride: 103 mmol/L (ref 96–106)
Creatinine, Ser: 0.76 mg/dL (ref 0.57–1.00)
Globulin, Total: 3 g/dL (ref 1.5–4.5)
Glucose: 103 mg/dL — ABNORMAL HIGH (ref 70–99)
Potassium: 4.6 mmol/L (ref 3.5–5.2)
Sodium: 143 mmol/L (ref 134–144)
Total Protein: 7.7 g/dL (ref 6.0–8.5)
eGFR: 82 mL/min/{1.73_m2} (ref >59)

## 2022-04-03 ENCOUNTER — Other Ambulatory Visit: Payer: Self-pay | Admitting: Internal Medicine

## 2022-04-03 DIAGNOSIS — E1165 Type 2 diabetes mellitus with hyperglycemia: Secondary | ICD-10-CM

## 2022-04-15 ENCOUNTER — Other Ambulatory Visit: Payer: Self-pay | Admitting: Internal Medicine

## 2022-04-15 DIAGNOSIS — E1169 Type 2 diabetes mellitus with other specified complication: Secondary | ICD-10-CM

## 2022-05-15 ENCOUNTER — Other Ambulatory Visit: Payer: Self-pay | Admitting: Internal Medicine

## 2022-05-15 DIAGNOSIS — E119 Type 2 diabetes mellitus without complications: Secondary | ICD-10-CM

## 2022-05-15 DIAGNOSIS — E1159 Type 2 diabetes mellitus with other circulatory complications: Secondary | ICD-10-CM

## 2022-05-17 NOTE — Telephone Encounter (Signed)
Requested Prescriptions  Pending Prescriptions Disp Refills   metoprolol succinate (TOPROL-XL) 50 MG 24 hr tablet [Pharmacy Med Name: METOPROLOL ER SUCCINATE '50MG'$  TABS] 135 tablet 0    Sig: TAKE 1 AND 1/2 TABLETS(75 MG) BY MOUTH DAILY     Cardiovascular:  Beta Blockers Passed - 05/15/2022  2:41 PM      Passed - Last BP in normal range    BP Readings from Last 1 Encounters:  12/25/21 113/70         Passed - Last Heart Rate in normal range    Pulse Readings from Last 1 Encounters:  12/25/21 76         Passed - Valid encounter within last 6 months    Recent Outpatient Visits           4 months ago Type 2 diabetes mellitus with microalbuminuria, with long-term current use of insulin ([place])   [place] Karle Plumber B, MD   7 months ago Type 2 diabetes mellitus without complication, with long-term current use of insulin [facility])   [place], [place], [place]   10 months ago Type 2 diabetes mellitus with microalbuminuria, with long-term current use of insulin ([place])   [place] Karle Plumber B, MD   1 year ago Type 2 diabetes mellitus without complication, with long-term current use of insulin ([place])   [place] Karle Plumber B, MD   1 year ago Type 2 diabetes mellitus without complication, with long-term current use of insulin ([place])   [place], MD       Future Appointments             In 1 month Ladell Pier, MD [place]             metFORMIN (GLUCOPHAGE) 1000 MG tablet [Pharmacy Med Name: METFORMIN '1000MG'$  TABLETS] 180 tablet 0    Sig: TAKE 1 TABLET(1000 MG) BY MOUTH TWICE DAILY WITH A MEAL     Endocrinology:  Diabetes - Biguanides Failed - 05/15/2022  2:41 PM      Failed - B12 Level in normal range and within 720 days     No results found for: "VITAMINB12"       Failed - CBC within normal limits and completed in the last 12 months    WBC  Date Value Ref Range Status  12/25/2021 5.4 3.4 - 10.8 x10E3/uL Final  12/30/2020 4.6 4.0 - 10.5 K/uL Final   RBC  Date Value Ref Range Status  12/25/2021 5.05 3.77 - 5.28 x10E6/uL Final  12/30/2020 4.92 3.87 - 5.11 MIL/uL Final   Hemoglobin  Date Value Ref Range Status  12/25/2021 13.4 11.1 - 15.9 g/dL Final   Hematocrit  Date Value Ref Range Status  12/25/2021 41.2 34.0 - 46.6 % Final   MCHC  Date Value Ref Range Status  12/25/2021 32.5 31.5 - 35.7 g/dL Final  12/30/2020 32.4 30.0 - 36.0 g/dL Final   [facility]  Date Value Ref Range Status  12/25/2021 26.5 (L) 26.6 - 33.0 pg Final  12/30/2020 26.6 26.0 - 34.0 pg Final   MCV  Date Value Ref Range Status  12/25/2021 82 79 - 97 fL Final   No results found for: "PLTCOUNTKUC", "LABPLAT", "POCPLA" RDW  Date Value Ref Range Status  12/25/2021 13.8 11.7 -  15.4 % Final         Passed - Cr in normal range and within 360 days    Creat  Date Value Ref Range Status  07/29/2015 0.75 0.50 - 0.99 mg/dL Final   Creatinine, Ser  Date Value Ref Range Status  12/25/2021 0.76 0.57 - 1.00 mg/dL Final   Creatinine, POC  Date Value Ref Range Status  12/09/2017 50 mg/dL Final   Creatinine, Urine  Date Value Ref Range Status  11/16/2012 110.4 mg/dL Final         Passed - HBA1C is between 0 and 7.9 and within 180 days    HbA1c, POC (prediabetic range)  Date Value Ref Range Status  12/09/2017 6.2 5.7 - 6.4 % Final   HbA1c, POC (controlled diabetic range)  Date Value Ref Range Status  12/25/2021 6.5 0.0 - 7.0 % Final         Passed - eGFR in normal range and within 360 days    GFR, Est African American  Date Value Ref Range Status  12/13/2014 71 >=60 mL/min Final   GFR calc Af Amer  Date Value Ref Range Status  09/20/2019 68 >59 mL/min/1.73 Final    Comment:    **[facility] currently reports eGFR in  compliance with the current**   recommendations of the [employer]. [facility] will   update reporting as new guidelines are published from the NKF-ASN   Task force.    GFR, Est Non African American  Date Value Ref Range Status  12/13/2014 61 >=60 mL/min Final    Comment:      The estimated GFR is a calculation valid for adults (>=52 years old) that uses the CKD-EPI algorithm to adjust for age and sex. It is   not to be used for children, pregnant women, hospitalized patients,    patients on dialysis, or with rapidly changing kidney function. According to the NKDEP, eGFR >89 is normal, 60-89 shows mild impairment, 30-59 shows moderate impairment, 15-29 shows severe impairment and <15 is ESRD.      GFR, Estimated  Date Value Ref Range Status  12/30/2020 >60 >60 mL/min Final    Comment:    (NOTE) Calculated using the CKD-EPI Creatinine Equation (2021)    eGFR  Date Value Ref Range Status  12/25/2021 82 >59 mL/min/1.73 Final         Passed - Valid encounter within last 6 months    Recent Outpatient Visits           4 months ago Type 2 diabetes mellitus with microalbuminuria, with long-term current use of insulin ([place])   [place] Karle Plumber B, MD   7 months ago Type 2 diabetes mellitus without complication, with long-term current use of insulin [facility])   [place], [place], [place]   10 months ago Type 2 diabetes mellitus with microalbuminuria, with long-term current use of insulin [facility])   [place] Karle Plumber B, MD   1 year ago Type 2 diabetes mellitus without complication, with long-term current use of insulin [facility])   [place] Karle Plumber B, MD   1 year ago Type 2 diabetes mellitus without complication, with long-term current use of insulin [facility])   [place] Ladell Pier, MD       Future Appointments  In 1 month Ladell Pier, MD [place]

## 2022-06-21 ENCOUNTER — Encounter: Payer: Self-pay | Admitting: Internal Medicine

## 2022-06-21 ENCOUNTER — Ambulatory Visit: Payer: Medicare Other | Attending: Internal Medicine | Admitting: Internal Medicine

## 2022-06-21 VITALS — BP 103/63 | HR 72 | Temp 98.8°F | Ht 65.0 in | Wt 138.0 lb

## 2022-06-21 DIAGNOSIS — R809 Proteinuria, unspecified: Secondary | ICD-10-CM | POA: Diagnosis not present

## 2022-06-21 DIAGNOSIS — L84 Corns and callosities: Secondary | ICD-10-CM

## 2022-06-21 DIAGNOSIS — I152 Hypertension secondary to endocrine disorders: Secondary | ICD-10-CM | POA: Diagnosis not present

## 2022-06-21 DIAGNOSIS — E119 Type 2 diabetes mellitus without complications: Secondary | ICD-10-CM

## 2022-06-21 DIAGNOSIS — E1169 Type 2 diabetes mellitus with other specified complication: Secondary | ICD-10-CM | POA: Diagnosis not present

## 2022-06-21 DIAGNOSIS — E1159 Type 2 diabetes mellitus with other circulatory complications: Secondary | ICD-10-CM

## 2022-06-21 DIAGNOSIS — Z794 Long term (current) use of insulin: Secondary | ICD-10-CM | POA: Diagnosis not present

## 2022-06-21 DIAGNOSIS — K644 Residual hemorrhoidal skin tags: Secondary | ICD-10-CM

## 2022-06-21 DIAGNOSIS — Z1211 Encounter for screening for malignant neoplasm of colon: Secondary | ICD-10-CM

## 2022-06-21 DIAGNOSIS — E1129 Type 2 diabetes mellitus with other diabetic kidney complication: Secondary | ICD-10-CM

## 2022-06-21 DIAGNOSIS — E785 Hyperlipidemia, unspecified: Secondary | ICD-10-CM

## 2022-06-21 DIAGNOSIS — N182 Chronic kidney disease, stage 2 (mild): Secondary | ICD-10-CM | POA: Diagnosis not present

## 2022-06-21 LAB — GLUCOSE, POCT (MANUAL RESULT ENTRY): POC Glucose: 125 mg/dl — AB (ref 70–99)

## 2022-06-21 LAB — POCT GLYCOSYLATED HEMOGLOBIN (HGB A1C): HbA1c, POC (controlled diabetic range): 7 % (ref 0.0–7.0)

## 2022-06-21 MED ORDER — METOPROLOL SUCCINATE ER 50 MG PO TB24: ORAL_TABLET | ORAL | 1 refills | Status: DC

## 2022-06-21 MED ORDER — AMLODIPINE BESYLATE 2.5 MG PO TABS: ORAL_TABLET | ORAL | 1 refills | Status: DC

## 2022-06-21 MED ORDER — ONETOUCH DELICA LANCETS 33G MISC: 12 refills | Status: DC

## 2022-06-21 MED ORDER — METFORMIN HCL 1000 MG PO TABS: ORAL_TABLET | ORAL | 1 refills | Status: DC

## 2022-06-21 MED ORDER — PRAVASTATIN SODIUM 40 MG PO TABS: 40.0000 mg | ORAL_TABLET | Freq: Every day | ORAL | 1 refills | Status: DC

## 2022-06-21 NOTE — Progress Notes (Signed)
Patient ID: Cindy Taylor, female    DOB: 24-Aug-1945  MRN: [id number]  CC: Diabetes (DM f/u. Med refills - OneTouch needles/Pt interested in Trulicity/Concerns about hemorroids/Already received flu vax. No to shingles vax)   Subjective: Cindy Taylor is a 77 y.o. female who presents for chronic ds management Her concerns today include:  Pt with DM, HTN, HL, Hep C treated with Harvoni, snuff user, CKD 2-3   DM: Results for orders placed or performed in visit on 06/21/22  POCT glucose (manual entry)  Result Value Ref Range   POC Glucose 125 (A) 70 - 99 mg/dl  POCT glycosylated hemoglobin (Hb A1C)  Result Value Ref Range   Hemoglobin A1C     HbA1c POC (<> result, manual entry)     HbA1c, POC (prediabetic range)     HbA1c, POC (controlled diabetic range) 7.0 0.0 - 7.0 %  Compliant with meds: Lantus 10 units and Metformin 1 gram BID.  Wants to try Trulicity; tired of sticking self with insulin  Checks BS but not often.   Doing good with eating habits but appetite decrease.  Things she use to like does not taste the same any more.  Down 4 lbs since last visit 12/2021.  BMI is 22.9. 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    Wants referral to foot doctor for callus on both big toes and discoloration of nails on the big toes.  HTN: She is on amlodipine 2.5 mg, Toprol-XL 75 mg and lisinopril/hydrochlorothiazide 20/12.5 mg daily.  Limits salt in foods. No CP/SOB/HA/dizziness Taking and tolerating Pravachol Still chews snuff.  Not ready to quit.   CKD:  GFR had improve from 60 to 82 on last visit.  Not on any NSAIDs  Saw Dr. Dema Severin about rectal tag last year.  Reports being told if it does not bother her to leave it alone.  Also given tips on how to keep bowel movements soft and regular like using Metamucil. However since then, she feels it has changed in size but it is not painful and no blood in stools.   HM:  never got shingles vaccine and decided against it.  Due for repeat c-scope Dr. Raquel James; hx of precancerous polyps.  Patient Active Problem List   Diagnosis Date Noted   Skin tag of rectum 07/17/2021   Microalbuminuria 09/30/2020   Unintentional weight loss 10/22/2016   Chews tobacco 01/13/2016   Onychomycosis of multiple toenails with type 2 diabetes mellitus ([place]) 01/13/2016   History of hepatitis C 05/23/2014   DM type 2 (diabetes mellitus, type 2) ([place]) 11/16/2012   Hyperlipidemia associated with type 2 diabetes mellitus ([place]) 11/16/2012   Essential hypertension, benign 12/12/2008     Current Outpatient Medications on File Prior to Visit  Medication Sig Dispense Refill   amLODipine (NORVASC) 2.5 MG tablet TAKE 1 TABLET(2.5 MG) BY MOUTH DAILY 90 tablet 1   aspirin 81 MG tablet Take 1 tablet (81 mg total) by mouth daily. 90 tablet 3   Blood Glucose Monitoring Suppl (ONETOUCH VERIO) w/Device KIT Check blood sugars once daily 1 kit 0   Calcium Citrate-Vitamin D 250-200 MG-UNIT TABS Take 1 tablet by mouth 2 (two) times daily with a meal.     glucose blood (ONETOUCH VERIO) test strip USE AS DIRECTED ONCE A DAY 100 strip 3   insulin glargine (LANTUS SOLOSTAR) 100 UNIT/ML Solostar Pen INJECT 10 UNITS UNDER THE SKIN DAILY 15 mL 5   Insulin Pen Needle (B-D ULTRAFINE  III SHORT PEN) 31G X 8 MM MISC USE AS DIRECTED WITH INSULIN PEN 100 each 2   lisinopril-hydrochlorothiazide (ZESTORETIC) 20-12.5 MG tablet Take 1 tablet by mouth daily. 90 tablet 3   metFORMIN (GLUCOPHAGE) 1000 MG tablet TAKE 1 TABLET(1000 MG) BY MOUTH TWICE DAILY WITH A MEAL 180 tablet 0   metoprolol succinate (TOPROL-XL) 50 MG 24 hr tablet TAKE 1 AND 1/2 TABLETS(75 MG) BY MOUTH DAILY 135 tablet 0   OneTouch Delica Lancets 99991111 MISC Check blood sugar fasting and before meals and again if pt feels bad (symptoms of hypo). 100 each 12   pravastatin (PRAVACHOL) 40 MG tablet TAKE 1 TABLET BY MOUTH EVERY DAY 90 tablet 0   No current facility-administered medications on file prior to visit.     No Known Allergies  Social History   Socioeconomic History   Marital status: Single    Spouse name: Not on file   Number of children: Not on file   Years of education: Not on file   Highest education level: Not on file  Occupational History   Not on file  Tobacco Use   Smoking status: Never   Smokeless tobacco: Current    Types: Snuff  Substance and Sexual Activity   Alcohol use: No    Comment: glass of wine at special occasion   Drug use: No   Sexual activity: Not Currently  Other Topics Concern   Not on file  Social History Narrative   Not on file   Social Determinants of Health   Financial Resource Strain: Low Risk  (11/17/2020)   Overall Financial Resource Strain (CARDIA)    Difficulty of Paying Living Expenses: Not hard at all  Food Insecurity: No Food Insecurity (11/17/2020)   Hunger Vital Sign    Worried About Running Out of Food in the Last Year: Never true    [place] in the Last Year: Never true  Transportation Needs: No Transportation Needs (11/17/2020)   PRAPARE - Hydrologist (Medical): No    Lack of Transportation (Non-Medical): No  Physical Activity: Inactive (11/17/2020)   Exercise Vital Sign    Days of Exercise per Week: 0 days    Minutes of Exercise per Session: 0 min  Stress: No Stress Concern Present (11/17/2020)   [place]    Feeling of Stress : Not at all  Social Connections: Unknown (11/17/2020)   Social Connection and Isolation Panel [NHANES]    Frequency of Communication with Friends and Family: More than three times a week    Frequency of Social Gatherings with Friends and Family: More than three times a week    Attends Religious Services: More than 4 times per year    Active Member of [employer] or Organizations: Yes    Attends Archivist Meetings: More than 4 times per year    Marital Status: Patient refused  Intimate Partner Violence:  Not At Risk (11/17/2020)   Humiliation, Afraid, Rape, and Kick questionnaire    Fear of Current or Ex-Partner: No    Emotionally Abused: No    Physically Abused: No    Sexually Abused: No    Family History  Problem Relation Age of Onset   Stroke Mother    Rectal cancer Mother    Colon polyps Neg Hx    Colon cancer Neg Hx    Esophageal cancer Neg Hx    Stomach cancer Neg Hx  Past Surgical History:  Procedure Laterality Date   TOTAL ABDOMINAL HYSTERECTOMY     TUBAL LIGATION      ROS: Review of Systems Negative except as stated above  PHYSICAL EXAM: BP 103/63 (BP Location: Left Arm, Patient Position: Sitting, Cuff Size: Normal)   Pulse 72   Temp 98.8 F (37.1 C) (Oral)   Ht '5\' 5"'$  (1.651 m)   Wt 138 lb (62.6 kg)   SpO2 99%   BMI 22.96 kg/m   Wt Readings from Last 3 Encounters:  06/21/22 138 lb (62.6 kg)  12/25/21 142 lb (64.4 kg)  11/17/21 142 lb 6.4 oz (64.6 kg)    Physical Exam  General appearance - alert, well appearing, and in no distress Mental status - normal mood, behavior, speech, dress, motor activity, and thought processes Neck - supple, no significant adenopathy Chest - clear to auscultation, no wheezes, rales or rhonchi, symmetric air entry Heart - normal rate, regular rhythm, normal S1, S2, no murmurs, rubs, clicks or gallops Rectal -patient with rectal tag noted.  It is about 1 cm in size.  It is not inflamed or tender to touch. Extremities -no lower extremity edema. Diabetic Foot Exam - Simple   Simple Foot Form Diabetic Foot exam was performed with the following findings: Yes 06/21/2022  1:28 PM  Visual Inspection See comments: Yes Sensation Testing Intact to touch and monofilament testing bilaterally: Yes Pulse Check Posterior Tibialis and Dorsalis pulse intact bilaterally: Yes Comments Toenails on the big toes are thick discolored and short.  She has calluses on the medial aspect of both big toes.         Latest Ref Rng & Units  12/25/2021   11:11 AM 12/30/2020   11:47 AM 09/29/2020    4:46 PM  CMP  Glucose 70 - 99 mg/dL 103  172  84   BUN 8 - 27 mg/dL '13  11  16   '$ Creatinine 0.57 - 1.00 mg/dL 0.76  0.92  1.27   Sodium 134 - 144 mmol/L 143  137  140   Potassium 3.5 - 5.2 mmol/L 4.6  3.7  4.8   Chloride 96 - 106 mmol/L 103  102  102   CO2 20 - 29 mmol/L '24  26  25   '$ Calcium 8.7 - 10.3 mg/dL 10.1  9.2  9.7   Total Protein 6.0 - 8.5 g/dL 7.7   7.6   Total Bilirubin 0.0 - 1.2 mg/dL 0.3   0.2   Alkaline Phos 44 - 121 IU/L 57   56   AST 0 - 40 IU/L 18   14   ALT 0 - 32 IU/L 12   16    Lipid Panel     Component Value Date/Time   CHOL 158 12/25/2021 1111   TRIG 63 12/25/2021 1111   HDL 67 12/25/2021 1111   CHOLHDL 2.4 12/25/2021 1111   CHOLHDL 2.8 07/29/2015 0918   VLDL 16 07/29/2015 0918   LDLCALC 78 12/25/2021 1111   LDLDIRECT 71 12/03/2009 2014    CBC    Component Value Date/Time   WBC 5.4 12/25/2021 1111   WBC 4.6 12/30/2020 1147   RBC 5.05 12/25/2021 1111   RBC 4.92 12/30/2020 1147   HGB 13.4 12/25/2021 1111   HCT 41.2 12/25/2021 1111   PLT 204 12/25/2021 1111   MCV 82 12/25/2021 1111   MCH 26.5 (L) 12/25/2021 1111   MCH 26.6 12/30/2020 1147   MCHC 32.5 12/25/2021 1111   MCHC 32.4  12/30/2020 1147   RDW 13.8 12/25/2021 1111   LYMPHSABS 3.9 07/24/2013 1741   MONOABS 0.6 07/24/2013 1741   EOSABS 0.1 07/24/2013 1741   BASOSABS 0.0 07/24/2013 1741    ASSESSMENT AND PLAN: 1. Type 2 diabetes mellitus with microalbuminuria, with long-term current use of insulin ([place]) At goal. We discussed her desire to change to Ozempic.  Her weight is within normal range and she is already reporting decrease/change in appetite.  Advised that Ozempic can and will cause weight loss which can be significant.  Because of this we decided to hold off on Ozempic.  She will continue with Lantus insulin and metformin 1 g twice a day. - POCT glucose (manual entry) - POCT glycosylated hemoglobin (Hb 123XX123) - OneTouch Delica  Lancets 99991111 MISC; Check blood sugar fasting and before meals and again if pt feels bad (symptoms of hypo).  Dispense: 100 each; Refill: 12  2. Hyperlipidemia associated with type 2 diabetes mellitus ([facility]) Continue Pravachol 40 mg daily.  3. Hypertension associated with diabetes ([place]) At goal.  Continue Norvasc 2.5 mg daily, lisinopril/HCTZ 20/12.5 mg daily, metoprolol 75 mg daily.  4. Pre-ulcerative corn or callous - Ambulatory referral to Podiatry  5. CKD (chronic kidney disease) stage 2, GFR 60-89 ml/min Continue to monitor. - Basic Metabolic Panel  6. Skin tag of rectum Gave patient the option of going back to see Dr. Dema Severin to have this removed.  However she states that it is not bothersome to her other than she thought it has increased in size.  She needs to see Dr. Hilarie Fredrickson for colonoscopy in any event.  Will have him take a look at it when he sees her for colonoscopy. - Ambulatory referral to Gastroenterology  7. Screening for colon cancer - Ambulatory referral to Gastroenterology    Patient was given the opportunity to ask questions.  Patient verbalized understanding of the plan and was able to repeat key elements of the plan.   This documentation was completed using Radio producer.  Any transcriptional errors are unintentional.  Orders Placed This Encounter  Procedures   POCT glucose (manual entry)   POCT glycosylated hemoglobin (Hb A1C)     Requested Prescriptions    No prescriptions requested or ordered in this encounter    No follow-ups on file.  Karle Plumber, MD, FACP

## 2022-06-22 LAB — BASIC METABOLIC PANEL
BUN/Creatinine Ratio: 17 (ref 12–28)
BUN: 16 mg/dL (ref 8–27)
CO2: 23 mmol/L (ref 20–29)
Calcium: 9.5 mg/dL (ref 8.7–10.3)
Chloride: 102 mmol/L (ref 96–106)
Creatinine, Ser: 0.96 mg/dL (ref 0.57–1.00)
Glucose: 104 mg/dL — ABNORMAL HIGH (ref 70–99)
Potassium: 4.4 mmol/L (ref 3.5–5.2)
Sodium: 140 mmol/L (ref 134–144)
eGFR: 62 mL/min/{1.73_m2} (ref >59)

## 2022-07-01 ENCOUNTER — Other Ambulatory Visit: Payer: Self-pay | Admitting: Internal Medicine

## 2022-07-01 DIAGNOSIS — Z1231 Encounter for screening mammogram for malignant neoplasm of breast: Secondary | ICD-10-CM

## 2022-07-08 ENCOUNTER — Ambulatory Visit: Payer: Medicare Other | Admitting: Podiatry

## 2022-07-15 ENCOUNTER — Encounter: Payer: Self-pay | Admitting: Podiatry

## 2022-07-15 ENCOUNTER — Ambulatory Visit: Payer: Medicare Other | Admitting: Podiatry

## 2022-07-15 DIAGNOSIS — Z794 Long term (current) use of insulin: Secondary | ICD-10-CM | POA: Diagnosis not present

## 2022-07-15 DIAGNOSIS — M79675 Pain in left toe(s): Secondary | ICD-10-CM

## 2022-07-15 DIAGNOSIS — E1129 Type 2 diabetes mellitus with other diabetic kidney complication: Secondary | ICD-10-CM | POA: Diagnosis not present

## 2022-07-15 DIAGNOSIS — R809 Proteinuria, unspecified: Secondary | ICD-10-CM

## 2022-07-15 DIAGNOSIS — B351 Tinea unguium: Secondary | ICD-10-CM | POA: Diagnosis not present

## 2022-07-15 DIAGNOSIS — L84 Corns and callosities: Secondary | ICD-10-CM | POA: Diagnosis not present

## 2022-07-15 DIAGNOSIS — M79674 Pain in right toe(s): Secondary | ICD-10-CM | POA: Diagnosis not present

## 2022-07-15 MED ORDER — CICLOPIROX 8 % EX SOLN: Freq: Every day | CUTANEOUS | 0 refills | Status: DC

## 2022-07-15 NOTE — Progress Notes (Signed)
  Subjective:  Patient ID: Cindy Taylor, female    DOB: May 16, 1945,  MRN: [id number]  Chief Complaint  Patient presents with   Diabetes    np re est care/ last seen 2017/ diabetic with calluses/ corns and nail fungus    77 y.o. female presents with the above complaint. History confirmed with patient.  She presents for follow-up for diabetes she says is well-controlled, does not know her exact last A1c.  Her nails are thick and brown discolored they cause discomfort in shoe gear.  She also has multiple calluses that are painful for her.  Objective:  Physical Exam: warm, good capillary refill, no trophic changes or ulcerative lesions, normal DP and PT pulses, and normal sensory exam. Left Foot: dystrophic yellowed discolored nail plates with subungual debris and painful callus submetatarsal 1, medial hallux, lateral fifth toe Right Foot: dystrophic yellowed discolored nail plates with subungual debris and painful callus right lateral fifth toe and medial hallux   Assessment:   1. Callus of foot   2. Pain due to onychomycosis of toenails of both feet   3. Type 2 diabetes mellitus with microalbuminuria, with long-term current use of insulin ([place])      Plan:  Patient was evaluated and treated and all questions answered.   Patient educated on diabetes. Discussed proper diabetic foot care and discussed risks and complications of disease. Educated patient in depth on reasons to return to the office immediately should he/she discover anything concerning or new on the feet. All questions answered. Discussed proper shoes as well.   Discussed the etiology and treatment options for the condition in detail with the patient. Educated patient on the topical and oral treatment options for mycotic nails. Recommended debridement of the nails today. Sharp and mechanical debridement performed of all painful and mycotic nails today. Nails debrided in length and thickness using a nail nipper to level  of comfort. Discussed treatment options including appropriate shoe gear. Follow up as needed for painful nails.  [place] Rx sent to pharmacy  All symptomatic hyperkeratoses were safely debrided with a sterile #15 blade to patient's level of comfort without incident. We discussed preventative and palliative care of these lesions including supportive and accommodative shoegear, padding, prefabricated and custom molded accommodative orthoses, use of a pumice stone and lotions/creams daily.   Return in about 3 months (around 10/15/2022) for at risk diabetic foot care.

## 2022-07-28 DIAGNOSIS — H524 Presbyopia: Secondary | ICD-10-CM | POA: Diagnosis not present

## 2022-07-28 LAB — HM DIABETES EYE EXAM

## 2022-08-06 ENCOUNTER — Other Ambulatory Visit: Payer: Self-pay | Admitting: Internal Medicine

## 2022-08-06 DIAGNOSIS — R809 Proteinuria, unspecified: Secondary | ICD-10-CM

## 2022-08-11 ENCOUNTER — Other Ambulatory Visit: Payer: Self-pay

## 2022-08-16 ENCOUNTER — Ambulatory Visit: Payer: Medicare Other

## 2022-08-24 ENCOUNTER — Encounter: Payer: Self-pay | Admitting: Internal Medicine

## 2022-10-03 ENCOUNTER — Other Ambulatory Visit: Payer: Self-pay | Admitting: Internal Medicine

## 2022-10-03 DIAGNOSIS — E1159 Type 2 diabetes mellitus with other circulatory complications: Secondary | ICD-10-CM

## 2022-10-18 ENCOUNTER — Ambulatory Visit: Payer: Medicare Other | Admitting: Podiatry

## 2022-10-25 ENCOUNTER — Ambulatory Visit: Payer: Medicare Other | Admitting: Internal Medicine

## 2022-11-19 ENCOUNTER — Telehealth: Payer: Medicare Other

## 2022-12-19 ENCOUNTER — Other Ambulatory Visit: Payer: Self-pay | Admitting: Internal Medicine

## 2022-12-19 DIAGNOSIS — E1169 Type 2 diabetes mellitus with other specified complication: Secondary | ICD-10-CM

## 2022-12-20 NOTE — Progress Notes (Unsigned)
Subjective:   Cindy Taylor is a 76 y.o. female who presents for Medicare Annual (Subsequent) preventive examination.  Visit Complete: {VISITMETHOD:[phone]}  Patient Medicare AWV questionnaire was completed by the patient on ***; I have confirmed that all information answered by patient is correct and no changes since this date.  Vital Signs: {telehealth vitals:30100}  Review of Systems    ***       Objective:    There were no vitals filed for this visit. There is no height or weight on file to calculate BMI.     11/17/2021   10:08 AM 12/30/2020   10:53 AM 11/17/2020    4:48 PM 05/20/2017   10:56 AM 03/01/2017    3:49 PM 01/24/2017    3:43 PM 10/22/2016    4:31 PM  Advanced Directives  Does Patient Have a Medical Advance Directive? No No No No No No No  Would patient like information on creating a medical advance directive? No - Patient declined No - Patient declined No - Patient declined   No - Patient declined     Current Medications (verified) Outpatient Encounter Medications as of 12/21/2022  Medication Sig   amLODipine (NORVASC) 2.5 MG tablet TAKE 1 TABLET(2.5 MG) BY MOUTH DAILY   aspirin 81 MG tablet Take 1 tablet (81 mg total) by mouth daily.   Blood Glucose Monitoring Suppl (ONETOUCH VERIO) w/Device KIT Check blood sugars once daily   Calcium Citrate-Vitamin D 250-200 MG-UNIT TABS Take 1 tablet by mouth 2 (two) times daily with a meal.   ciclopirox (PENLAC) 8 % solution Apply topically at bedtime. Apply over nail and surrounding skin. Apply daily over previous coat. After seven (7) days, may remove with alcohol and continue cycle.   glucose blood (ONETOUCH VERIO) test strip TEST ONCE DAILY. E11.29   insulin glargine (LANTUS SOLOSTAR) 100 UNIT/ML Solostar Pen INJECT 10 UNITS UNDER THE SKIN DAILY   Insulin Pen Needle (B-D ULTRAFINE III SHORT PEN) 31G X 8 MM MISC USE AS DIRECTED WITH INSULIN PEN   lisinopril-hydrochlorothiazide (ZESTORETIC) 20-12.5 MG tablet TAKE  1 TABLET BY MOUTH DAILY   metFORMIN (GLUCOPHAGE) 1000 MG tablet TAKE 1 TABLET(1000 MG) BY MOUTH TWICE DAILY WITH A MEAL   metoprolol succinate (TOPROL-XL) 50 MG 24 hr tablet Take with or immediately following a meal.   OneTouch Delica Lancets 33G MISC Check blood sugar fasting and before meals and again if pt feels bad (symptoms of hypo).   pravastatin (PRAVACHOL) 40 MG tablet Take 1 tablet (40 mg total) by mouth daily.   No facility-administered encounter medications on file as of 12/21/2022.    Allergies (verified) Patient has no known allergies.   History: Past Medical History:  Diagnosis Date   Arthritis    Combined senile cataract    Diabetes mellitus    mellitus? x 9 years   Hepatitis C    HTN (hypertension)    x8 years   Hyperlipidemia    Hypertensive retinopathy of both eyes    Past Surgical History:  Procedure Laterality Date   TOTAL ABDOMINAL HYSTERECTOMY     TUBAL LIGATION     Family History  Problem Relation Age of Onset   Stroke Mother    Rectal cancer Mother    Colon polyps Neg Hx    Colon cancer Neg Hx    Esophageal cancer Neg Hx    Stomach cancer Neg Hx    Social History   Socioeconomic History   Marital status: Single  Spouse name: Not on file   Number of children: Not on file   Years of education: Not on file   Highest education level: Not on file  Occupational History   Not on file  Tobacco Use   Smoking status: Never   Smokeless tobacco: Current    Types: Snuff  Substance and Sexual Activity   Alcohol use: No    Comment: glass of wine at special occasion   Drug use: No   Sexual activity: Not Currently  Other Topics Concern   Not on file  Social History Narrative   Not on file   Social Determinants of Health   Financial Resource Strain: Low Risk  (11/17/2020)   Overall Financial Resource Strain (CARDIA)    Difficulty of Paying Living Expenses: Not hard at all  Food Insecurity: No Food Insecurity (11/17/2020)   Hunger Vital Sign     Worried About Running Out of Food in the Last Year: Never true    Ran Out of Food in the Last Year: Never true  Transportation Needs: No Transportation Needs (11/17/2020)   PRAPARE - Administrator, Civil Service (Medical): No    Lack of Transportation (Non-Medical): No  Physical Activity: Inactive (11/17/2020)   Exercise Vital Sign    Days of Exercise per Week: 0 days    Minutes of Exercise per Session: 0 min  Stress: No Stress Concern Present (11/17/2020)   [employer] of Occupational Health - Occupational Stress Questionnaire    Feeling of Stress : Not at all  Social Connections: Unknown (11/17/2020)   Social Connection and Isolation Panel [NHANES]    Frequency of Communication with Friends and Family: More than three times a week    Frequency of Social Gatherings with Friends and Family: More than three times a week    Attends Religious Services: More than 4 times per year    Active Member of [employer] or Organizations: Yes    Attends Engineer, structural: More than 4 times per year    Marital Status: Patient declined    Tobacco Counseling Ready to quit: Not Answered Counseling given: Not Answered   Clinical Intake:                        Activities of Daily Living     No data to display          Patient Care Team: Marcine Matar, MD as PCP - General (Internal Medicine)  Indicate any recent Medical Services you may have received from other than [facility] providers in the past year (date may be approximate).     Assessment:   This is a routine wellness examination for Cindy Taylor.  Hearing/Vision screen No results found.  Dietary issues and exercise activities discussed:     Goals Addressed   None    Depression Screen    06/21/2022   10:57 AM 11/17/2021   10:09 AM 10/08/2021    9:29 AM 07/17/2021    9:27 AM 03/17/2021   10:07 AM 11/17/2020    4:49 PM 09/29/2020    3:50 PM  PHQ 2/9 Scores  PHQ - 2 Score 2 0 2 0 0 0 0  PHQ- 9  Score 6  4        Fall Risk    06/21/2022   10:49 AM 12/25/2021   10:14 AM 11/17/2021   10:09 AM 10/08/2021    9:29 AM 03/17/2021   10:07 AM  Fall  Risk   Falls in the past year? 0 0 0 0 0  Number falls in past yr: 0 0 0 0 0  Injury with Fall? 0 0 0 0 0  Risk for fall due to : No Fall Risks No Fall Risks  No Fall Risks No Fall Risks  Follow up  Falls evaluation completed       MEDICARE RISK AT HOME:    TIMED UP AND GO:  Was the test performed?  No    Cognitive Function:    11/17/2021   10:13 AM 11/17/2020    4:49 PM  MMSE - Mini Mental State Exam  Orientation to time 5 5  Orientation to Place 5 5  Registration 3 3  Attention/ Calculation 5 5  Recall 3 1  Language- name 2 objects 2 2  Language- repeat 1 1  Language- follow 3 step command 3 3  Language- read & follow direction 1 1  Write a sentence 1 1  Copy design 1 1  Total score 30 28        Immunizations Immunization History  Administered Date(s) Administered   Influenza Split 05/10/2012   Influenza,inj,Quad PF,6+ Mos 02/16/2013, 01/17/2014, 01/13/2016, 01/25/2017, 12/09/2017, 02/15/2019, 12/06/2019, 03/17/2021, 12/25/2021   Pneumococcal Conjugate-13 01/17/2014   Pneumococcal Polysaccharide-23 07/24/2013   Tdap 01/13/2016    TDAP status: Up to date  Flu Vaccine status: Due, Education has been provided regarding the importance of this vaccine. Advised may receive this vaccine at local pharmacy or Health Dept. Aware to provide a copy of the vaccination record if obtained from local pharmacy or Health Dept. Verbalized acceptance and understanding.  Pneumococcal vaccine status: Up to date  Covid-19 vaccine status: Information provided on how to obtain vaccines.   Qualifies for Shingles Vaccine? Yes   Zostavax completed No   Shingrix Completed?: No.    Education has been provided regarding the importance of this vaccine. Patient has been advised to call insurance company to determine out of pocket expense if they  have not yet received this vaccine. Advised may also receive vaccine at local pharmacy or Health Dept. Verbalized acceptance and understanding.  Screening Tests Health Maintenance  Topic Date Due   COVID-19 Vaccine (1) Never done   Zoster Vaccines- Shingrix (1 of 2) Never done   Colonoscopy  05/20/2022   INFLUENZA VACCINE  11/18/2022   Medicare Annual Wellness (AWV)  11/18/2022   Diabetic kidney evaluation - Urine ACR  12/26/2022   HEMOGLOBIN A1C  12/22/2022   Diabetic kidney evaluation - eGFR measurement  06/21/2023   FOOT EXAM  06/21/2023   OPHTHALMOLOGY EXAM  07/28/2023   DTaP/Tdap/Td (2 - Td or Tdap) 01/12/2026   Pneumonia Vaccine 12+ Years old  Completed   DEXA SCAN  Completed   Hepatitis C Screening  Completed   HPV VACCINES  Aged Out    Health Maintenance  Health Maintenance Due  Topic Date Due   COVID-19 Vaccine (1) Never done   Zoster Vaccines- Shingrix (1 of 2) Never done   Colonoscopy  05/20/2022   INFLUENZA VACCINE  11/18/2022   Medicare Annual Wellness (AWV)  11/18/2022   Diabetic kidney evaluation - Urine ACR  12/26/2022    {Colorectal cancer screening:2101809}  Mammogram status: Ordered 07/01/22. Pt provided with contact info and advised to call to schedule appt.   Bone Density status: Completed 01/16/19. Results reflect: Bone density results: OSTEOPENIA. Repeat every 2 years.  Lung Cancer Screening: (Low Dose CT Chest recommended if Age 21-80 years,  20 pack-year currently smoking OR have quit w/in 15years.) does not qualify.   Lung Cancer Screening Referral: n/a  Additional Screening:  Hepatitis C Screening: does qualify; Completed 03/17/21  Vision Screening: Recommended annual ophthalmology exams for early detection of glaucoma and other disorders of the eye. Is the patient up to date with their annual eye exam?  Yes  Who is the provider or what is the name of the office in which the patient attends annual eye exams? [facility] If pt is not  established with a provider, would they like to be referred to a provider to establish care? No .   Dental Screening: Recommended annual dental exams for proper oral hygiene  Diabetic Foot Exam: Diabetic Foot Exam: Completed 06/21/22  Community Resource Referral / Chronic Care Management: CRR required this visit?  {YES/NO:21197}  CCM required this visit?  {CCM Required choices:[phone]}     Plan:     I have personally reviewed and noted the following in the patient's chart:   Medical and social history Use of alcohol, tobacco or illicit drugs  Current medications and supplements including opioid prescriptions. {Opioid Prescriptions:[phone]} Functional ability and status Nutritional status Physical activity Advanced directives List of other physicians Hospitalizations, surgeries, and ER visits in previous 12 months Vitals Screenings to include cognitive, depression, and falls Referrals and appointments  In addition, I have reviewed and discussed with patient certain preventive protocols, quality metrics, and best practice recommendations. A written personalized care plan for preventive services as well as general preventive health recommendations were provided to patient.     Kandis Fantasia [place], [place]   12/23/6211   After Visit Summary: {CHL AMB AWV After Visit [phone]}  Nurse Notes: ***

## 2022-12-20 NOTE — Patient Instructions (Incomplete)
Cindy Taylor , Thank you for taking time to come for your Medicare Wellness Visit. I appreciate your ongoing commitment to your health goals. Please review the following plan we discussed and let me know if I can assist you in the future.   Referrals/Orders/Follow-Ups/Clinician Recommendations: Aim for 30 minutes of exercise or brisk walking, 6-8 glasses of water, and 5 servings of fruits and vegetables each day.  You have an order for:  []   2D Mammogram  [x]   3D Mammogram  []   Bone Density     Please call for appointment:   [facility] [address] [place], [place] [address] [phone]  Make sure to wear two-piece clothing.  No lotions, powders, or deodorants the day of the appointment. Make sure to bring picture ID and insurance card.  Bring list of medications you are currently taking including any supplements.   Schedule your [place] screening mammogram through MyChart!   Log into your MyChart account.  Go to 'Visit' (or 'Appointments' if on mobile App) --> Schedule an Appointment  Under 'Select a Reason for Visit' choose the Mammogram Screening option.  Complete the pre-visit questions and select the time and place that best fits your schedule.    This is a list of the screening recommended for you and due dates:  Health Maintenance  Topic Date Due   COVID-19 Vaccine (1) Never done   Zoster (Shingles) Vaccine (1 of 2) Never done   Colon Cancer Screening  05/20/2022   Flu Shot  11/18/2022   Medicare Annual Wellness Visit  11/18/2022   Yearly kidney health urinalysis for diabetes  12/26/2022   Hemoglobin A1C  12/22/2022   Yearly kidney function blood test for diabetes  06/21/2023   Complete foot exam   06/21/2023   Eye exam for diabetics  07/28/2023   DTaP/Tdap/Td vaccine (2 - Td or Tdap) 01/12/2026   Pneumonia Vaccine  Completed   DEXA scan (bone density measurement)  Completed   Hepatitis C Screening  Completed   HPV Vaccine  Aged  Out    Advanced directives: (ACP Link)Information on Advanced Care Planning can be found at [facility] Advance Health Care Directives Advance Health Care Directives ([email or web address]   Next Medicare Annual Wellness Visit scheduled for next year: Yes

## 2022-12-21 ENCOUNTER — Ambulatory Visit: Payer: Medicare Other | Attending: Internal Medicine

## 2022-12-21 ENCOUNTER — Other Ambulatory Visit: Payer: Self-pay | Admitting: Internal Medicine

## 2022-12-21 VITALS — Ht 65.0 in | Wt 138.0 lb

## 2022-12-21 DIAGNOSIS — Z0000 Encounter for general adult medical examination without abnormal findings: Secondary | ICD-10-CM

## 2022-12-21 DIAGNOSIS — E1169 Type 2 diabetes mellitus with other specified complication: Secondary | ICD-10-CM

## 2023-01-09 ENCOUNTER — Other Ambulatory Visit: Payer: Self-pay | Admitting: Internal Medicine

## 2023-01-09 DIAGNOSIS — I152 Hypertension secondary to endocrine disorders: Secondary | ICD-10-CM

## 2023-01-10 ENCOUNTER — Other Ambulatory Visit: Payer: Self-pay | Admitting: Internal Medicine

## 2023-01-10 DIAGNOSIS — I152 Hypertension secondary to endocrine disorders: Secondary | ICD-10-CM

## 2023-01-11 NOTE — Telephone Encounter (Signed)
Requested Prescriptions  Refused Prescriptions Disp Refills   lisinopril-hydrochlorothiazide (ZESTORETIC) 20-12.5 MG tablet [Pharmacy Med Name: LISINOPRIL-HCTZ 20/12.5MG  TABLETS] 90 tablet     Sig: TAKE 1 TABLET BY MOUTH DAILY     Cardiovascular:  ACEI + Diuretic Combos Failed - 01/10/2023  5:37 PM      Failed - Na in normal range and within 180 days    Sodium  Date Value Ref Range Status  06/21/2022 140 134 - 144 mmol/L Final         Failed - K in normal range and within 180 days    Potassium  Date Value Ref Range Status  06/21/2022 4.4 3.5 - 5.2 mmol/L Final         Failed - Cr in normal range and within 180 days    Creat  Date Value Ref Range Status  07/29/2015 0.75 0.50 - 0.99 mg/dL Final   Creatinine, Ser  Date Value Ref Range Status  06/21/2022 0.96 0.57 - 1.00 mg/dL Final   Creatinine, POC  Date Value Ref Range Status  12/09/2017 50 mg/dL Final   Creatinine, Urine  Date Value Ref Range Status  11/16/2012 110.4 mg/dL Final         Failed - eGFR is 30 or above and within 180 days    GFR, Est African American  Date Value Ref Range Status  12/13/2014 71 >=60 mL/min Final   GFR calc Af Amer  Date Value Ref Range Status  09/20/2019 68 >59 mL/min/1.73 Final    Comment:    **[facility] currently reports eGFR in compliance with the current**   recommendations of the [employer]. [facility] will   update reporting as new guidelines are published from the NKF-ASN   Task force.    GFR, Est Non African American  Date Value Ref Range Status  12/13/2014 61 >=60 mL/min Final    Comment:      The estimated GFR is a calculation valid for adults (>=63 years old) that uses the CKD-EPI algorithm to adjust for age and sex. It is   not to be used for children, pregnant women, hospitalized patients,    patients on dialysis, or with rapidly changing kidney function. According to the NKDEP, eGFR >89 is normal, 60-89 shows mild impairment, 30-59 shows moderate  impairment, 15-29 shows severe impairment and <15 is ESRD.      GFR, Estimated  Date Value Ref Range Status  12/30/2020 >60 >60 mL/min Final    Comment:    (NOTE) Calculated using the CKD-EPI Creatinine Equation (2021)    eGFR  Date Value Ref Range Status  06/21/2022 62 >59 mL/min/1.73 Final         Passed - Patient is not pregnant      Passed - Last BP in normal range    BP Readings from Last 1 Encounters:  06/21/22 103/63         Passed - Valid encounter within last 6 months    Recent Outpatient Visits           6 months ago Type 2 diabetes mellitus with microalbuminuria, with long-term current use of insulin [facility])   [place] [facility] Jonah Blue B, MD   1 year ago Type 2 diabetes mellitus with microalbuminuria, with long-term current use of insulin [facility])   [facility], MD   1 year ago Type 2 diabetes mellitus without complication, with long-term current use of insulin (  [facility])   [place] [facility] [place], [place], [place]   1 year ago Type 2 diabetes mellitus with microalbuminuria, with long-term current use of insulin [facility])   [place] [facility] Jonah Blue B, MD   1 year ago Type 2 diabetes mellitus without complication, with long-term current use of insulin [facility])   [place] [facility] Marcine Matar, MD

## 2023-01-21 ENCOUNTER — Other Ambulatory Visit: Payer: Self-pay | Admitting: Internal Medicine

## 2023-01-21 DIAGNOSIS — E1169 Type 2 diabetes mellitus with other specified complication: Secondary | ICD-10-CM

## 2023-02-04 ENCOUNTER — Other Ambulatory Visit: Payer: Self-pay | Admitting: Internal Medicine

## 2023-02-04 DIAGNOSIS — R809 Proteinuria, unspecified: Secondary | ICD-10-CM

## 2023-02-04 NOTE — Telephone Encounter (Signed)
Requested Prescriptions  Pending Prescriptions Disp Refills   LANTUS SOLOSTAR 100 UNIT/ML Solostar Pen [Pharmacy Med Name: LANTUS SOLOSTAR PEN INJ 3ML] 15 mL 5    Sig: INJECT 10 UNITS UNDER THE SKIN DAILY     Endocrinology:  Diabetes - Insulins Failed - 02/04/2023 10:15 AM      Failed - HBA1C is between 0 and 7.9 and within 180 days    HbA1c, POC (prediabetic range)  Date Value Ref Range Status  12/09/2017 6.2 5.7 - 6.4 % Final   HbA1c, POC (controlled diabetic range)  Date Value Ref Range Status  06/21/2022 7.0 0.0 - 7.0 % Final         Passed - Valid encounter within last 6 months    Recent Outpatient Visits           7 months ago Type 2 diabetes mellitus with microalbuminuria, with long-term current use of insulin (HCC)   [place] [facility] Jonah Blue B, MD   1 year ago Type 2 diabetes mellitus with microalbuminuria, with long-term current use of insulin [facility])   [place] [facility] Jonah Blue B, MD   1 year ago Type 2 diabetes mellitus without complication, with long-term current use of insulin [facility])   [place] [facility] [place], [place], [place]   1 year ago Type 2 diabetes mellitus with microalbuminuria, with long-term current use of insulin [facility])   [place] [facility] Jonah Blue B, MD   1 year ago Type 2 diabetes mellitus without complication, with long-term current use of insulin [facility])    [facility] & [facility] Marcine Matar, MD

## 2023-02-19 ENCOUNTER — Other Ambulatory Visit: Payer: Self-pay | Admitting: Internal Medicine

## 2023-02-19 DIAGNOSIS — E1159 Type 2 diabetes mellitus with other circulatory complications: Secondary | ICD-10-CM

## 2023-02-21 NOTE — Telephone Encounter (Signed)
Requested medication (s) are due for refill today: yes  Requested medication (s) are on the active medication list: yes  Last refill:  01/10/23 #30  Future visit scheduled:no  Notes to clinic:  overdue lab work   Requested Prescriptions  Pending Prescriptions Disp Refills   lisinopril-hydrochlorothiazide (ZESTORETIC) 20-12.5 MG tablet [Pharmacy Med Name: LISINOPRIL-HCTZ 20/12.5MG  TABLETS] 30 tablet 0    Sig: TAKE 1 TABLET BY MOUTH DAILY     Cardiovascular:  ACEI + Diuretic Combos Failed - 02/19/2023  6:06 PM      Failed - Na in normal range and within 180 days    Sodium  Date Value Ref Range Status  06/21/2022 140 134 - 144 mmol/L Final         Failed - K in normal range and within 180 days    Potassium  Date Value Ref Range Status  06/21/2022 4.4 3.5 - 5.2 mmol/L Final         Failed - Cr in normal range and within 180 days    Creat  Date Value Ref Range Status  07/29/2015 0.75 0.50 - 0.99 mg/dL Final   Creatinine, Ser  Date Value Ref Range Status  06/21/2022 0.96 0.57 - 1.00 mg/dL Final   Creatinine, POC  Date Value Ref Range Status  12/09/2017 50 mg/dL Final   Creatinine, Urine  Date Value Ref Range Status  11/16/2012 110.4 mg/dL Final         Failed - eGFR is 30 or above and within 180 days    GFR, Est African American  Date Value Ref Range Status  12/13/2014 71 >=60 mL/min Final   GFR calc Af Amer  Date Value Ref Range Status  09/20/2019 68 >59 mL/min/1.73 Final    Comment:    **[facility] currently reports eGFR in compliance with the current**   recommendations of the [employer]. [facility] will   update reporting as new guidelines are published from the NKF-ASN   Task force.    GFR, Est Non African American  Date Value Ref Range Status  12/13/2014 61 >=60 mL/min Final    Comment:      The estimated GFR is a calculation valid for adults (>=81 years old) that uses the CKD-EPI algorithm to adjust for age and sex. It is   not to be used  for children, pregnant women, hospitalized patients,    patients on dialysis, or with rapidly changing kidney function. According to the NKDEP, eGFR >89 is normal, 60-89 shows mild impairment, 30-59 shows moderate impairment, 15-29 shows severe impairment and <15 is ESRD.      GFR, Estimated  Date Value Ref Range Status  12/30/2020 >60 >60 mL/min Final    Comment:    (NOTE) Calculated using the CKD-EPI Creatinine Equation (2021)    eGFR  Date Value Ref Range Status  06/21/2022 62 >59 mL/min/1.73 Final         Passed - Patient is not pregnant      Passed - Last BP in normal range    BP Readings from Last 1 Encounters:  06/21/22 103/63         Passed - Valid encounter within last 6 months    Recent Outpatient Visits           8 months ago Type 2 diabetes mellitus with microalbuminuria, with long-term current use of insulin [facility])    [facility] Jonah Blue B, MD   1 year ago Type 2 diabetes  mellitus with microalbuminuria, with long-term current use of insulin [facility])   [place] [facility] Jonah Blue B, MD   1 year ago Type 2 diabetes mellitus without complication, with long-term current use of insulin [facility])   [place] [facility] [place], [place], [place]   1 year ago Type 2 diabetes mellitus with microalbuminuria, with long-term current use of insulin [facility])   [place] [facility] Jonah Blue B, MD   1 year ago Type 2 diabetes mellitus without complication, with long-term current use of insulin [facility])   [place] [facility] Marcine Matar, MD

## 2023-02-22 ENCOUNTER — Other Ambulatory Visit: Payer: Self-pay | Admitting: Internal Medicine

## 2023-02-22 DIAGNOSIS — E1159 Type 2 diabetes mellitus with other circulatory complications: Secondary | ICD-10-CM

## 2023-03-09 ENCOUNTER — Other Ambulatory Visit: Payer: Self-pay | Admitting: Internal Medicine

## 2023-03-09 DIAGNOSIS — E1169 Type 2 diabetes mellitus with other specified complication: Secondary | ICD-10-CM

## 2023-03-16 ENCOUNTER — Other Ambulatory Visit: Payer: Self-pay | Admitting: Internal Medicine

## 2023-03-16 DIAGNOSIS — E1159 Type 2 diabetes mellitus with other circulatory complications: Secondary | ICD-10-CM

## 2023-03-16 NOTE — Telephone Encounter (Signed)
Requested Prescriptions  Pending Prescriptions Disp Refills   metoprolol succinate (TOPROL-XL) 50 MG 24 hr tablet [Pharmacy Med Name: METOPROLOL ER SUCCINATE 50MG  TABS] 135 tablet     Sig: TAKE 1 AND 1/2 TABLETS BY MOUTH DAILY WITH OR IMMEDIATELY FOLLOWING A MEAL     Cardiovascular:  Beta Blockers Passed - 03/16/2023  4:45 PM      Passed - Last BP in normal range    BP Readings from Last 1 Encounters:  06/21/22 103/63         Passed - Last Heart Rate in normal range    Pulse Readings from Last 1 Encounters:  06/21/22 72         Passed - Valid encounter within last 6 months    Recent Outpatient Visits           8 months ago Type 2 diabetes mellitus with microalbuminuria, with long-term current use of insulin (HCC)   [facility] [place]. [facility] Jonah Blue B, MD   1 year ago Type 2 diabetes mellitus with microalbuminuria, with long-term current use of insulin [facility])   [facility] [place]. [facility] Jonah Blue B, MD   1 year ago Type 2 diabetes mellitus without complication, with long-term current use of insulin (HCC)   [facility] Dept Of [place]. [facility] [place], [place], [place]   1 year ago Type 2 diabetes mellitus with microalbuminuria, with long-term current use of insulin [facility])   [facility] - [facility] Dept Of [place]. [facility] Jonah Blue B, MD   1 year ago Type 2 diabetes mellitus without complication, with long-term current use of insulin (HCC)   [facility] Dept Of [place]. [facility] Marcine Matar, MD               c

## 2023-03-21 ENCOUNTER — Ambulatory Visit (HOSPITAL_COMMUNITY)
Admission: EM | Admit: 2023-03-21 | Discharge: 2023-03-21 | Disposition: A | Discharge status: Home / Self Care | Payer: Medicare Other | Attending: Family Medicine | Admitting: Family Medicine

## 2023-03-21 ENCOUNTER — Encounter (HOSPITAL_COMMUNITY): Payer: Self-pay

## 2023-03-21 ENCOUNTER — Ambulatory Visit (INDEPENDENT_AMBULATORY_CARE_PROVIDER_SITE_OTHER): Payer: Medicare Other

## 2023-03-21 DIAGNOSIS — M25551 Pain in right hip: Secondary | ICD-10-CM

## 2023-03-21 DIAGNOSIS — M1611 Unilateral primary osteoarthritis, right hip: Secondary | ICD-10-CM | POA: Diagnosis not present

## 2023-03-21 MED ORDER — KETOROLAC TROMETHAMINE 30 MG/ML IJ SOLN
30.0000 mg | Freq: Once | INTRAMUSCULAR | Status: AC
  Administered 2023-03-21: 30 mg via INTRAMUSCULAR

## 2023-03-21 MED ORDER — KETOROLAC TROMETHAMINE 30 MG/ML IJ SOLN
INTRAMUSCULAR | Status: AC
  Filled 2023-03-21: qty 1

## 2023-03-21 NOTE — Discharge Instructions (Addendum)
You may take ibuprofen as needed for the next 2 weeks.  You may take it daily if you feel like your pain is not going away or is slightly taking longer to go away.  If you feel like the pain has not resolved at all then you may ask your primary care doctor for referral to sports medicine.

## 2023-03-21 NOTE — ED Triage Notes (Signed)
Patient here today with c/o pain in her right hip after bumping into the side of a bed a week ago. Patient states that she didn't noticed much pain until 3 days ago. Pain comes and goes. Pain is mostly at night.

## 2023-03-21 NOTE — ED Provider Notes (Signed)
[facility]    CSN: [id number] Arrival date & time: 03/21/23  0846      History   Chief Complaint Chief Complaint  Patient presents with   Hip Pain    HPI Cindy Taylor is a 77 y.o. female.   Patient is presenting with approximately 2-day history of right-sided hip pain.  Patient states that last Tuesday she hit a board and had some pain on the lateral side of her hip immediately.  Patient states that the pain subsequently went away and over the weekend she started having some pain on the inner thigh/groin as well as radiating to the right lower back.  Patient states that it does not feel like the pain she originally felt when she hit her hip.  Patient is never had pain like this in the past.  Patient otherwise has no other concerns at this time.   Hip Pain    Past Medical History:  Diagnosis Date   Arthritis    Combined senile cataract    Diabetes mellitus    mellitus? x 9 years   Hepatitis C    HTN (hypertension)    x8 years   Hyperlipidemia    Hypertensive retinopathy of both eyes     Patient Active Problem List   Diagnosis Date Noted   Skin tag of rectum 07/17/2021   Microalbuminuria 09/30/2020   Unintentional weight loss 10/22/2016   Chews tobacco 01/13/2016   Onychomycosis of multiple toenails with type 2 diabetes mellitus (HCC) 01/13/2016   History of hepatitis C 05/23/2014   DM type 2 (diabetes mellitus, type 2) (HCC) 11/16/2012   Hyperlipidemia associated with type 2 diabetes mellitus (HCC) 11/16/2012   Essential hypertension, benign 12/12/2008    Past Surgical History:  Procedure Laterality Date   TOTAL ABDOMINAL HYSTERECTOMY     TUBAL LIGATION      OB History   No obstetric history on file.      Home Medications    Prior to Admission medications   Medication Sig Start Date End Date Taking? Authorizing Provider  amLODipine (NORVASC) 2.5 MG tablet TAKE 1 TABLET(2.5 MG) BY MOUTH DAILY 06/21/22   Marcine Matar, MD   aspirin 81 MG tablet Take 1 tablet (81 mg total) by mouth daily. 10/22/16   Funches, Gerilyn Nestle, MD  Blood Glucose Monitoring Suppl (ONETOUCH VERIO) w/Device KIT Check blood sugars once daily 07/03/18   Marcine Matar, MD  Calcium Citrate-Vitamin D 250-200 MG-UNIT TABS Take 1 tablet by mouth 2 (two) times daily with a meal.    [provider]  ciclopirox (PENLAC) 8 % solution Apply topically at bedtime. Apply over nail and surrounding skin. Apply daily over previous coat. After seven (7) days, may remove with alcohol and continue cycle. 07/15/22   McDonald, Adam R, DPM  glucose blood (ONETOUCH VERIO) test strip TEST ONCE DAILY. E11.29 08/06/22   Marcine Matar, MD  Insulin Pen Needle (B-D ULTRAFINE III SHORT PEN) 31G X 8 MM MISC USE AS DIRECTED WITH INSULIN PEN 04/05/22   Marcine Matar, MD  LANTUS SOLOSTAR 100 UNIT/ML Solostar Pen INJECT 10 UNITS UNDER THE SKIN DAILY 02/04/23   Marcine Matar, MD  lisinopril-hydrochlorothiazide (ZESTORETIC) 20-12.5 MG tablet Take 1 tablet by mouth daily. Please make appointment with Dr. Laural Benes 02/21/23   Marcine Matar, MD  metFORMIN (GLUCOPHAGE) 1000 MG tablet TAKE 1 TABLET(1000 MG) BY MOUTH TWICE DAILY WITH A MEAL 06/21/22   Marcine Matar, MD  metoprolol succinate (TOPROL-XL)  50 MG 24 hr tablet TAKE 1 AND 1/2 TABLETS BY MOUTH ONCE DAILY WITH OR IMMEDIATELY FOLLOWING A MEAL 03/16/23   Marcine Matar, MD  OneTouch Delica Lancets 33G MISC Check blood sugar fasting and before meals and again if pt feels bad (symptoms of hypo). 06/21/22   Marcine Matar, MD  pravastatin (PRAVACHOL) 40 MG tablet TAKE 1 TABLET(40 MG) BY MOUTH DAILY 12/21/22   Marcine Matar, MD    Family History Family History  Problem Relation Age of Onset   Stroke Mother    Rectal cancer Mother    Colon polyps Neg Hx    Colon cancer Neg Hx    Esophageal cancer Neg Hx    Stomach cancer Neg Hx     Social History Social History   Tobacco Use   Smoking status:  Never   Smokeless tobacco: Current    Types: Snuff  Substance Use Topics   Alcohol use: No    Comment: glass of wine at special occasion   Drug use: No     Allergies   Patient has no known allergies.   Review of Systems Review of Systems   Physical Exam Triage Vital Signs ED Triage Vitals  Encounter Vitals Group     BP 03/21/23 0922 107/67     Systolic BP Percentile --      Diastolic BP Percentile --      Pulse Rate 03/21/23 0922 70     Resp 03/21/23 0922 16     Temp 03/21/23 0922 97.8 F (36.6 C)     Temp Source 03/21/23 0922 Oral     SpO2 03/21/23 0922 96 %     Weight 03/21/23 0923 134 lb (60.8 kg)     Height 03/21/23 0923 5\' 4"  (1.626 m)     Head Circumference --      Peak Flow --      Pain Score 03/21/23 0923 7     Pain Loc --      Pain Education --      Exclude from Growth Chart --    No data found.  Updated Vital Signs BP 107/67 (BP Location: Left Arm)   Pulse 70   Temp 97.8 F (36.6 C) (Oral)   Resp 16   Ht 5\' 4"  (1.626 m)   Wt 60.8 kg   SpO2 96%   BMI 23.00 kg/m   Visual Acuity Right Eye Distance:   Left Eye Distance:   Bilateral Distance:    Right Eye Near:   Left Eye Near:    Bilateral Near:     Physical Exam  Right hip: Inspection is negative for any acute abnormalities, there is some tenderness to palpation of the hip joint inner thigh itself, no tenderness to palpation over the greater trochanter, there is tenderness to palpation over the lower right lumbar area.  Range of motion is full without any difficulty.  Strength is 5 out of 5 with flexion and extension of the hip. FADIR/FABER: Negative Passive logroll equal bilaterally  UC Treatments / Results  Labs (all labs ordered are listed, but only abnormal results are displayed) Labs Reviewed - No data to display  EKG   Radiology No results found.  Procedures Procedures (including critical care time)  Medications Ordered in UC Medications  ketorolac (TORADOL) 30 MG/ML  injection 30 mg (has no administration in time range)    Initial Impression / Assessment and Plan / UC Course  I have reviewed the triage vital signs  and the nursing notes.  Pertinent labs & imaging results that were available during my care of the patient were reviewed by me and considered in my medical decision making (see chart for details).     Patient presenting with some right-sided hip pain that appears to be a mix of mild degenerative changes along with some muscular changes.  Patient notes some pain on the inner groin likely related at the hip joint itself, x-rays of the femur and hip show no signs of acute fracture.  There is some mild medial degenerative changes of the hip joint itself.  At this time, we will treat patient conservatively with a Toradol shot today.  Will have patient take as needed ibuprofen for the next few days.  Patient advised to follow-up with her PCP if she feels like her pain is not improving. Final Clinical Impressions(s) / UC Diagnoses   Final diagnoses:  Right hip pain     Discharge Instructions      You may take ibuprofen as needed for the next 2 weeks.  You may take it daily if you feel like your pain is not going away or is slightly taking longer to go away.  If you feel like the pain has not resolved at all then you may ask your primary care doctor for referral to sports medicine.     ED Prescriptions   None    PDMP not reviewed this encounter.   Brenton Grills, MD 03/21/23 1013

## 2023-03-24 ENCOUNTER — Ambulatory Visit: Payer: Self-pay

## 2023-03-24 NOTE — Telephone Encounter (Signed)
Call placed to patient unable to reach message left on VM.   

## 2023-03-24 NOTE — Telephone Encounter (Signed)
3rd attempt, Patient called, left VM to return the call to the office to speak to the NT.  Unable to reach patient after 3 attempts by [facility] NT, routing to the provider for resolution per protocol.  Summary: Pt reports that her side is swollen and she has a rash on her right hip.    Pt reports that her side is swollen and she has a rash on her right hip. Pt stated that the swelling is near her stomach. Attempted to schedule an appt but pt stated that the appt was too far away. Pt requests call back. Cb# [phone]

## 2023-03-24 NOTE — Telephone Encounter (Signed)
Patient called, left VM to return the call to the office to speak to the NT.    Summary: Pt reports that her side is swollen and she has a rash on her right hip.   Pt reports that her side is swollen and she has a rash on her right hip. Pt stated that the swelling is near her stomach. Attempted to schedule an appt but pt stated that the appt was too far away. Pt requests call back. Cb# [phone]

## 2023-03-24 NOTE — Telephone Encounter (Signed)
Second attempt to reach pt. Left message to call back. 

## 2023-03-25 ENCOUNTER — Other Ambulatory Visit: Payer: Self-pay | Admitting: Internal Medicine

## 2023-03-25 ENCOUNTER — Ambulatory Visit (HOSPITAL_COMMUNITY)
Admission: EM | Admit: 2023-03-25 | Discharge: 2023-03-25 | Disposition: A | Discharge status: Home / Self Care | Payer: Medicare Other | Attending: Emergency Medicine | Admitting: Emergency Medicine

## 2023-03-25 ENCOUNTER — Telehealth: Payer: Self-pay | Admitting: Internal Medicine

## 2023-03-25 ENCOUNTER — Encounter (HOSPITAL_COMMUNITY): Payer: Self-pay | Admitting: *Deleted

## 2023-03-25 DIAGNOSIS — B029 Zoster without complications: Secondary | ICD-10-CM

## 2023-03-25 DIAGNOSIS — E1159 Type 2 diabetes mellitus with other circulatory complications: Secondary | ICD-10-CM

## 2023-03-25 MED ORDER — VALACYCLOVIR HCL 1 G PO TABS: 1000.0000 mg | ORAL_TABLET | Freq: Three times a day (TID) | ORAL | 0 refills | Status: AC

## 2023-03-25 NOTE — Telephone Encounter (Signed)
  Chief Complaint: Pain and swelling to right hip. Swelling comes around up to her abdomen. Has a rash on hip. Small red spots with dark center. Asking to be worked in today. Symptoms: Above Frequency: Last week Pertinent Negatives: Patient denies fever Disposition: [] ED /[] Urgent Care (no appt availability in office) / [] Appointment(In office/virtual)/ []  [facility]/ [] Home Care/ [] Refused Recommended Disposition /[] [facility]/ [x]  Follow-up with PCP Additional Notes: Please advise pt.  Reason for Disposition . [1] Painful rash AND [2] multiple small blisters grouped together (i.e., dermatomal distribution or "band" or "stripe")  Answer Assessment - Initial Assessment Questions 1. LOCATION and RADIATION: "Where is the pain located?"      Right hip, swelling around hip and up to stomach 2. QUALITY: "What does the pain feel like?"  (e.g., sharp, dull, aching, burning)     Ache 3. SEVERITY: "How bad is the pain?" "What does it keep you from doing?"   (Scale 1-10; or mild, moderate, severe)   -  MILD (1-3): doesn't interfere with normal activities    -  MODERATE (4-7): interferes with normal activities (e.g., work or school) or awakens from sleep, limping    -  SEVERE (8-10): excruciating pain, unable to do any normal activities, unable to walk     10 4. ONSET: "When did the pain start?" "Does it come and go, or is it there all the time?"     Last week 5. WORK OR EXERCISE: "Has there been any recent work or exercise that involved this part of the body?"      Bumped right hip last week 6. CAUSE: "What do you think is causing the hip pain?"      Unsure 7. AGGRAVATING FACTORS: "What makes the hip pain worse?" (e.g., walking, climbing stairs, running)     No 8. OTHER SYMPTOMS: "Do you have any other symptoms?" (e.g., back pain, pain shooting down leg,  fever, rash)     No  Protocols used: Hip Pain-A-AH

## 2023-03-25 NOTE — Telephone Encounter (Signed)
See pervious notes

## 2023-03-25 NOTE — Telephone Encounter (Signed)
Pt called back after being triaged this am.  She forgot to tell the nurse a nurse practitioner from her insurance co came to her home for a wellness visit.  The practitioner said it looks like she may have shingles.  But the NP is not for sure.

## 2023-03-25 NOTE — ED Triage Notes (Signed)
Pt states she was seen on Monday for her right hip pain. She states she now has a rash in that area that started on Tuesday. She states she had a appt with someone from insurance yesterday and was advised it looks like she has shingles.   She states that the rash is in the same area she got the tordal injection on Monday.

## 2023-03-25 NOTE — Telephone Encounter (Signed)
Spoke with patient patient voiced that she has a very painful raised rash on the right hip and stomach. Voiced she wen to UC on Monday and there was nothing done. Voiced that the pain and the rash is worsening . Advised patient to go the UC or ED to be assessed, Patient is in agreement.

## 2023-03-25 NOTE — Telephone Encounter (Signed)
-----   Message from Dickinson C sent at 03/23/2023  3:04 PM EST ----- Contacted pt 2x to sch f/u left a vm I'll contact her again by this afternoon ----- Message ----- From: Marcine Matar, MD Sent: 03/16/2023   4:41 PM EST To: [facility] Admin Pool  Let pt know that she is over due for f/u.  Please give her appt.  Refill sent on Metoprolol but needs f/u prior to next refill request.

## 2023-03-25 NOTE — Discharge Instructions (Addendum)
Your rash is consistent with herpes zoster, shingles.  Take the Valtrex as prescribed.  This medication is the most effective when taken as soon as the blisters or vesicles develop.  Please follow-up with your primary care provider if you continue to have pain after finishing the Valtrex, or have any recurrent outbreaks.

## 2023-03-25 NOTE — ED Provider Notes (Signed)
[facility]    CSN: [id number] Arrival date & time: 03/25/23  1211      History   Chief Complaint Chief Complaint  Patient presents with   Rash   Hip Pain    HPI Cindy Taylor is a 77 y.o. female.   Patient presents to clinic for complaints of ongoing right-sided abdominal/hip pain that radiates into the back of her right flank.  She was evaluated in clinic on Monday where she had negative x-rays and was given a Toradol shot.  Reports the Toradol shot did not help her pain.  The next day she was evaluated by an Economist who noted she had developed a rash and that it look like shingles.   Patient denies ever having shingles in the past.  She has not had a shingles vaccine.  She has not taken any medication or tried any interventions for the pain.  The history is provided by the patient and medical records.  Rash Hip Pain    Past Medical History:  Diagnosis Date   Arthritis    Combined senile cataract    Diabetes mellitus    mellitus? x 9 years   Hepatitis C    HTN (hypertension)    x8 years   Hyperlipidemia    Hypertensive retinopathy of both eyes     Patient Active Problem List   Diagnosis Date Noted   Skin tag of rectum 07/17/2021   Microalbuminuria 09/30/2020   Unintentional weight loss 10/22/2016   Chews tobacco 01/13/2016   Onychomycosis of multiple toenails with type 2 diabetes mellitus (HCC) 01/13/2016   History of hepatitis C 05/23/2014   DM type 2 (diabetes mellitus, type 2) (HCC) 11/16/2012   Hyperlipidemia associated with type 2 diabetes mellitus (HCC) 11/16/2012   Essential hypertension, benign 12/12/2008    Past Surgical History:  Procedure Laterality Date   TOTAL ABDOMINAL HYSTERECTOMY     TUBAL LIGATION      OB History   No obstetric history on file.      Home Medications    Prior to Admission medications   Medication Sig Start Date End Date Taking? Authorizing Provider  amLODipine (NORVASC) 2.5  MG tablet TAKE 1 TABLET(2.5 MG) BY MOUTH DAILY 06/21/22  Yes Marcine Matar, MD  aspirin 81 MG tablet Take 1 tablet (81 mg total) by mouth daily. 10/22/16  Yes Funches, Josalyn, MD  Blood Glucose Monitoring Suppl (ONETOUCH VERIO) w/Device KIT Check blood sugars once daily 07/03/18  Yes Marcine Matar, MD  Calcium Citrate-Vitamin D 250-200 MG-UNIT TABS Take 1 tablet by mouth 2 (two) times daily with a meal.   Yes [provider]  ciclopirox (PENLAC) 8 % solution Apply topically at bedtime. Apply over nail and surrounding skin. Apply daily over previous coat. After seven (7) days, may remove with alcohol and continue cycle. 07/15/22  Yes McDonald, Adam R, DPM  glucose blood (ONETOUCH VERIO) test strip TEST ONCE DAILY. E11.29 08/06/22  Yes Marcine Matar, MD  Insulin Pen Needle (B-D ULTRAFINE III SHORT PEN) 31G X 8 MM MISC USE AS DIRECTED WITH INSULIN PEN 04/05/22  Yes Marcine Matar, MD  LANTUS SOLOSTAR 100 UNIT/ML Solostar Pen INJECT 10 UNITS UNDER THE SKIN DAILY 02/04/23  Yes Marcine Matar, MD  lisinopril-hydrochlorothiazide (ZESTORETIC) 20-12.5 MG tablet Take 1 tablet by mouth daily. Please make appointment with Dr. Laural Benes 02/21/23  Yes Marcine Matar, MD  metFORMIN (GLUCOPHAGE) 1000 MG tablet TAKE 1 TABLET(1000 MG) BY MOUTH TWICE  DAILY WITH A MEAL 06/21/22  Yes Marcine Matar, MD  metoprolol succinate (TOPROL-XL) 50 MG 24 hr tablet TAKE 1 AND 1/2 TABLETS BY MOUTH ONCE DAILY WITH OR IMMEDIATELY FOLLOWING A MEAL 03/16/23  Yes Marcine Matar, MD  OneTouch Delica Lancets 33G MISC Check blood sugar fasting and before meals and again if pt feels bad (symptoms of hypo). 06/21/22  Yes Marcine Matar, MD  pravastatin (PRAVACHOL) 40 MG tablet TAKE 1 TABLET(40 MG) BY MOUTH DAILY 12/21/22  Yes Marcine Matar, MD  valACYclovir (VALTREX) 1000 MG tablet Take 1 tablet (1,000 mg total) by mouth 3 (three) times daily for 7 days. 03/25/23 04/01/23 Yes Banessa Mao, [place] N, FNP     Family History Family History  Problem Relation Age of Onset   Stroke Mother    Rectal cancer Mother    Colon polyps Neg Hx    Colon cancer Neg Hx    Esophageal cancer Neg Hx    Stomach cancer Neg Hx     Social History Social History   Tobacco Use   Smoking status: Never   Smokeless tobacco: Current    Types: Snuff  Vaping Use   Vaping status: Never Used  Substance Use Topics   Alcohol use: No    Comment: glass of wine at special occasion   Drug use: No     Allergies   Patient has no known allergies.   Review of Systems Review of Systems  Per HPI   Physical Exam Triage Vital Signs ED Triage Vitals  Encounter Vitals Group     BP 03/25/23 1239 (!) 161/69     Systolic BP Percentile --      Diastolic BP Percentile --      Pulse Rate 03/25/23 1239 77     Resp 03/25/23 1239 18     Temp 03/25/23 1239 98.3 F (36.8 C)     Temp Source 03/25/23 1239 Oral     SpO2 03/25/23 1239 98 %     Weight --      Height --      Head Circumference --      Peak Flow --      Pain Score 03/25/23 1238 4     Pain Loc --      Pain Education --      Exclude from Growth Chart --    No data found.  Updated Vital Signs BP (!) 161/69 (BP Location: Right Arm)   Pulse 77   Temp 98.3 F (36.8 C) (Oral)   Resp 18   SpO2 98%   Visual Acuity Right Eye Distance:   Left Eye Distance:   Bilateral Distance:    Right Eye Near:   Left Eye Near:    Bilateral Near:     Physical Exam Vitals and nursing note reviewed.  Constitutional:      Appearance: Normal appearance.  HENT:     Head: Normocephalic and atraumatic.     Right Ear: External ear normal.     Left Ear: External ear normal.     Nose: Nose normal.     Mouth/Throat:     Mouth: Mucous membranes are moist.  Eyes:     Conjunctiva/sclera: Conjunctivae normal.  Cardiovascular:     Rate and Rhythm: Normal rate.  Pulmonary:     Effort: Pulmonary effort is normal. No respiratory distress.  Musculoskeletal:         General: Tenderness present. Normal range of motion.  Skin:  General: Skin is warm and dry.     Findings: Rash present.  Neurological:     General: No focal deficit present.     Mental Status: She is alert and oriented to person, place, and time.  Psychiatric:        Mood and Affect: Mood normal.        Behavior: Behavior normal. Behavior is cooperative.      UC Treatments / Results  Labs (all labs ordered are listed, but only abnormal results are displayed) Labs Reviewed - No data to display  EKG   Radiology No results found.  Procedures Procedures (including critical care time)  Medications Ordered in UC Medications - No data to display  Initial Impression / Assessment and Plan / UC Course  I have reviewed the triage vital signs and the nursing notes.  Pertinent labs & imaging results that were available during my care of the patient were reviewed by me and considered in my medical decision making (see chart for details).  Vitals and triage reviewed, patient is hemodynamically stable.  Ambulatory.  No injuries since visit on Monday.  Patient does have a rash to her right flank that is blistered and vesicular, consistent with herpes zoster.  Area of pain seems to follow a single dermatome.  Will cover with Valtrex.  Blistered areas have been present since Tuesday, unsure how effective the Valtrex will be at this time.  No other areas of rashes or pain, low concern for disseminated herpes zoster. Plan of care, follow-up care return precautions given, no questions at this time.     Final Clinical Impressions(s) / UC Diagnoses   Final diagnoses:  Herpes zoster without complication     Discharge Instructions      Your rash is consistent with herpes zoster, shingles.  Take the Valtrex as prescribed.  This medication is the most effective when taken as soon as the blisters or vesicles develop.  Please follow-up with your primary care provider if you continue to have  pain after finishing the Valtrex, or have any recurrent outbreaks.     ED Prescriptions     Medication Sig Dispense Auth. Provider   valACYclovir (VALTREX) 1000 MG tablet Take 1 tablet (1,000 mg total) by mouth 3 (three) times daily for 7 days. 21 tablet Michial Disney, [place], [place]      PDMP not reviewed this encounter.   Seaborn Nakama, [place] N, [place] 03/25/23 [phone]

## 2023-03-28 ENCOUNTER — Other Ambulatory Visit: Payer: Self-pay | Admitting: Internal Medicine

## 2023-03-28 DIAGNOSIS — E1159 Type 2 diabetes mellitus with other circulatory complications: Secondary | ICD-10-CM

## 2023-03-28 NOTE — Telephone Encounter (Signed)
Requested medications are due for refill today.  yes  Requested medications are on the active medications list.  yes  Last refill. 02/21/2023 #30 0 rf  Future visit scheduled.   no  Notes to clinic.  Pt is more than 3 months overdue for an ov. Courtesy refill already given.    Requested Prescriptions  Pending Prescriptions Disp Refills   lisinopril-hydrochlorothiazide (ZESTORETIC) 20-12.5 MG tablet [Pharmacy Med Name: LISINOPRIL-HCTZ 20/12.5MG  TABLETS] 30 tablet 0    Sig: TAKE 1 TABLET BY MOUTH DAILY     Cardiovascular:  ACEI + Diuretic Combos Failed - 03/25/2023  5:15 PM      Failed - Na in normal range and within 180 days    Sodium  Date Value Ref Range Status  06/21/2022 140 134 - 144 mmol/L Final         Failed - K in normal range and within 180 days    Potassium  Date Value Ref Range Status  06/21/2022 4.4 3.5 - 5.2 mmol/L Final         Failed - Cr in normal range and within 180 days    Creat  Date Value Ref Range Status  07/29/2015 0.75 0.50 - 0.99 mg/dL Final   Creatinine, Ser  Date Value Ref Range Status  06/21/2022 0.96 0.57 - 1.00 mg/dL Final   Creatinine, POC  Date Value Ref Range Status  12/09/2017 50 mg/dL Final   Creatinine, Urine  Date Value Ref Range Status  11/16/2012 110.4 mg/dL Final         Failed - eGFR is 30 or above and within 180 days    GFR, Est African American  Date Value Ref Range Status  12/13/2014 71 >=60 mL/min Final   GFR calc Af Amer  Date Value Ref Range Status  09/20/2019 68 >59 mL/min/1.73 Final    Comment:    **[facility] currently reports eGFR in compliance with the current**   recommendations of the [employer]. [facility] will   update reporting as new guidelines are published from the NKF-ASN   Task force.    GFR, Est Non African American  Date Value Ref Range Status  12/13/2014 61 >=60 mL/min Final    Comment:      The estimated GFR is a calculation valid for adults (>=71 years old) that uses the  CKD-EPI algorithm to adjust for age and sex. It is   not to be used for children, pregnant women, hospitalized patients,    patients on dialysis, or with rapidly changing kidney function. According to the NKDEP, eGFR >89 is normal, 60-89 shows mild impairment, 30-59 shows moderate impairment, 15-29 shows severe impairment and <15 is ESRD.      GFR, Estimated  Date Value Ref Range Status  12/30/2020 >60 >60 mL/min Final    Comment:    (NOTE) Calculated using the CKD-EPI Creatinine Equation (2021)    eGFR  Date Value Ref Range Status  06/21/2022 62 >59 mL/min/1.73 Final         Failed - Last BP in normal range    BP Readings from Last 1 Encounters:  03/25/23 (!) 161/69         Passed - Patient is not pregnant      Passed - Valid encounter within last 6 months    Recent Outpatient Visits           9 months ago Type 2 diabetes mellitus with microalbuminuria, with long-term current use of insulin (HCC)   [place]  [facility] Of [place]. [facility] Jonah Blue B, MD   1 year ago Type 2 diabetes mellitus with microalbuminuria, with long-term current use of insulin [facility])   [place] [facility] [place] Jonah Blue B, MD   1 year ago Type 2 diabetes mellitus without complication, with long-term current use of insulin (HCC)   [place] [facility] Of [place]. [facility] [place] [place], [place]   1 year ago Type 2 diabetes mellitus with microalbuminuria, with long-term current use of insulin [facility])   [place] [facility] Dept Of [place] Jonah Blue B, MD   2 years ago Type 2 diabetes mellitus without complication, with long-term current use of insulin (HCC)   [place] [facility] Dept Of [place]. [facility] Marcine Matar, MD

## 2023-03-29 ENCOUNTER — Other Ambulatory Visit: Payer: Self-pay | Admitting: Internal Medicine

## 2023-03-29 DIAGNOSIS — E1159 Type 2 diabetes mellitus with other circulatory complications: Secondary | ICD-10-CM

## 2023-03-30 ENCOUNTER — Telehealth: Payer: Self-pay | Admitting: Internal Medicine

## 2023-03-30 NOTE — Telephone Encounter (Signed)
Patient scheduled for 03/31/2023

## 2023-03-30 NOTE — Telephone Encounter (Signed)
Pt is calling in because she needs an appointment with Dr. Laural Benes or another provider due to her having Shingles. Earliest appointment is in January and pt says she cannot be out of work that long. Pt says when she went to Urgent Care they only gave her a note for 2 days and if she is going to be out longer she needs a note to cover that. Pt is requesting someone give her a call.

## 2023-03-31 ENCOUNTER — Other Ambulatory Visit: Payer: Self-pay

## 2023-03-31 ENCOUNTER — Emergency Department (HOSPITAL_COMMUNITY)
Admission: EM | Admit: 2023-03-31 | Discharge: 2023-03-31 | Disposition: A | Discharge status: Home / Self Care | Payer: Medicare Other | Attending: Emergency Medicine | Admitting: Emergency Medicine

## 2023-03-31 ENCOUNTER — Ambulatory Visit: Payer: Medicare Other | Attending: Internal Medicine | Admitting: Internal Medicine

## 2023-03-31 ENCOUNTER — Encounter (HOSPITAL_COMMUNITY): Payer: Self-pay

## 2023-03-31 ENCOUNTER — Encounter: Payer: Self-pay | Admitting: Internal Medicine

## 2023-03-31 VITALS — BP 101/66 | HR 80 | Temp 98.2°F | Ht 64.0 in | Wt 127.0 lb

## 2023-03-31 DIAGNOSIS — N390 Urinary tract infection, site not specified: Secondary | ICD-10-CM | POA: Diagnosis not present

## 2023-03-31 DIAGNOSIS — E1129 Type 2 diabetes mellitus with other diabetic kidney complication: Secondary | ICD-10-CM

## 2023-03-31 DIAGNOSIS — E119 Type 2 diabetes mellitus without complications: Secondary | ICD-10-CM | POA: Diagnosis not present

## 2023-03-31 DIAGNOSIS — I10 Essential (primary) hypertension: Secondary | ICD-10-CM | POA: Insufficient documentation

## 2023-03-31 DIAGNOSIS — Z794 Long term (current) use of insulin: Secondary | ICD-10-CM | POA: Insufficient documentation

## 2023-03-31 DIAGNOSIS — Z7982 Long term (current) use of aspirin: Secondary | ICD-10-CM | POA: Insufficient documentation

## 2023-03-31 DIAGNOSIS — R634 Abnormal weight loss: Secondary | ICD-10-CM

## 2023-03-31 DIAGNOSIS — Z5986 Financial insecurity: Secondary | ICD-10-CM | POA: Diagnosis not present

## 2023-03-31 DIAGNOSIS — Z1211 Encounter for screening for malignant neoplasm of colon: Secondary | ICD-10-CM

## 2023-03-31 DIAGNOSIS — I152 Hypertension secondary to endocrine disorders: Secondary | ICD-10-CM | POA: Diagnosis not present

## 2023-03-31 DIAGNOSIS — I951 Orthostatic hypotension: Secondary | ICD-10-CM

## 2023-03-31 DIAGNOSIS — Z7984 Long term (current) use of oral hypoglycemic drugs: Secondary | ICD-10-CM | POA: Diagnosis not present

## 2023-03-31 DIAGNOSIS — Z860100 Personal history of colon polyps, unspecified: Secondary | ICD-10-CM | POA: Diagnosis not present

## 2023-03-31 DIAGNOSIS — N3001 Acute cystitis with hematuria: Secondary | ICD-10-CM | POA: Insufficient documentation

## 2023-03-31 DIAGNOSIS — B029 Zoster without complications: Secondary | ICD-10-CM | POA: Diagnosis not present

## 2023-03-31 DIAGNOSIS — E1159 Type 2 diabetes mellitus with other circulatory complications: Secondary | ICD-10-CM

## 2023-03-31 DIAGNOSIS — R42 Dizziness and giddiness: Secondary | ICD-10-CM

## 2023-03-31 DIAGNOSIS — R809 Proteinuria, unspecified: Secondary | ICD-10-CM

## 2023-03-31 DIAGNOSIS — Z79899 Other long term (current) drug therapy: Secondary | ICD-10-CM | POA: Diagnosis not present

## 2023-03-31 DIAGNOSIS — E785 Hyperlipidemia, unspecified: Secondary | ICD-10-CM

## 2023-03-31 DIAGNOSIS — Z1231 Encounter for screening mammogram for malignant neoplasm of breast: Secondary | ICD-10-CM

## 2023-03-31 DIAGNOSIS — E1169 Type 2 diabetes mellitus with other specified complication: Secondary | ICD-10-CM

## 2023-03-31 DIAGNOSIS — R531 Weakness: Secondary | ICD-10-CM | POA: Diagnosis not present

## 2023-03-31 DIAGNOSIS — R350 Frequency of micturition: Secondary | ICD-10-CM | POA: Diagnosis present

## 2023-03-31 LAB — CBC WITH DIFFERENTIAL/PLATELET
Abs Immature Granulocytes: 0.01 10*3/uL (ref 0.00–0.07)
Basophils Absolute: 0 10*3/uL (ref 0.0–0.1)
Basophils Relative: 1 %
Eosinophils Absolute: 0.1 10*3/uL (ref 0.0–0.5)
Eosinophils Relative: 1 %
HCT: 42.6 % (ref 36.0–46.0)
Hemoglobin: 13.9 g/dL (ref 12.0–15.0)
Immature Granulocytes: 0 %
Lymphocytes Relative: 56 %
Lymphs Abs: 3.4 10*3/uL (ref 0.7–4.0)
MCH: 26.7 pg (ref 26.0–34.0)
MCHC: 32.6 g/dL (ref 30.0–36.0)
MCV: 81.8 fL (ref 80.0–100.0)
Monocytes Absolute: 0.4 10*3/uL (ref 0.1–1.0)
Monocytes Relative: 7 %
Neutro Abs: 2.2 10*3/uL (ref 1.7–7.7)
Neutrophils Relative %: 35 %
Platelets: 218 10*3/uL (ref 150–400)
RBC: 5.21 MIL/uL — ABNORMAL HIGH (ref 3.87–5.11)
RDW: 13.8 % (ref 11.5–15.5)
WBC: 6.1 10*3/uL (ref 4.0–10.5)
nRBC: 0 % (ref 0.0–0.2)

## 2023-03-31 LAB — URINALYSIS, ROUTINE W REFLEX MICROSCOPIC
Bilirubin Urine: NEGATIVE
Glucose, UA: NEGATIVE mg/dL
Ketones, ur: NEGATIVE mg/dL
Nitrite: NEGATIVE
Protein, ur: NEGATIVE mg/dL
Specific Gravity, Urine: 1.017 (ref 1.005–1.030)
pH: 5 (ref 5.0–8.0)

## 2023-03-31 LAB — COMPREHENSIVE METABOLIC PANEL
ALT: 14 U/L (ref 0–44)
AST: 19 U/L (ref 15–41)
Albumin: 3.9 g/dL (ref 3.5–5.0)
Alkaline Phosphatase: 43 U/L (ref 38–126)
Anion gap: 8 (ref 5–15)
BUN: 17 mg/dL (ref 8–23)
CO2: 26 mmol/L (ref 22–32)
Calcium: 9.5 mg/dL (ref 8.9–10.3)
Chloride: 102 mmol/L (ref 98–111)
Creatinine, Ser: 0.81 mg/dL (ref 0.44–1.00)
GFR, Estimated: >60 mL/min (ref >60)
Glucose, Bld: 95 mg/dL (ref 70–99)
Potassium: 4.1 mmol/L (ref 3.5–5.1)
Sodium: 136 mmol/L (ref 135–145)
Total Bilirubin: 0.5 mg/dL (ref <1.2)
Total Protein: 7.6 g/dL (ref 6.5–8.1)

## 2023-03-31 LAB — GLUCOSE, POCT (MANUAL RESULT ENTRY): POC Glucose: 123 mg/dL — AB (ref 70–99)

## 2023-03-31 LAB — TROPONIN I (HIGH SENSITIVITY)
Troponin I (High Sensitivity): 3 ng/L (ref <18)
Troponin I (High Sensitivity): <2 ng/L (ref <18)

## 2023-03-31 LAB — POCT GLYCOSYLATED HEMOGLOBIN (HGB A1C): HbA1c, POC (controlled diabetic range): 6.3 % (ref 0.0–7.0)

## 2023-03-31 MED ORDER — METFORMIN HCL 1000 MG PO TABS: ORAL_TABLET | ORAL | 1 refills | Status: DC

## 2023-03-31 MED ORDER — SODIUM CHLORIDE 0.9 % IV SOLN
1.0000 g | Freq: Once | INTRAVENOUS | Status: AC
  Administered 2023-03-31: 1 g via INTRAVENOUS
  Filled 2023-03-31: qty 10

## 2023-03-31 MED ORDER — PRAVASTATIN SODIUM 40 MG PO TABS: 40.0000 mg | ORAL_TABLET | Freq: Every day | ORAL | 1 refills | Status: DC

## 2023-03-31 MED ORDER — CEPHALEXIN 500 MG PO CAPS: 500.0000 mg | ORAL_CAPSULE | Freq: Four times a day (QID) | ORAL | 0 refills | Status: AC

## 2023-03-31 MED ORDER — METOPROLOL SUCCINATE ER 50 MG PO TB24: ORAL_TABLET | ORAL | 1 refills | Status: DC

## 2023-03-31 MED ORDER — SODIUM CHLORIDE 0.9 % IV BOLUS
1000.0000 mL | Freq: Once | INTRAVENOUS | Status: AC
Start: 2023-03-31 — End: 2023-03-31
  Administered 2023-03-31: 1000 mL via INTRAVENOUS

## 2023-03-31 MED ORDER — LISINOPRIL-HYDROCHLOROTHIAZIDE 20-12.5 MG PO TABS: ORAL_TABLET | ORAL | 1 refills | Status: DC

## 2023-03-31 NOTE — ED Provider Triage Note (Signed)
Emergency Medicine Provider Triage Evaluation Note  Cindy Taylor , a 76 y.o. female  was evaluated in triage.  Pt complains of dizziness at home when standing upx1 week after being diagnosed with shingles. Had orthostatics done at PCP and was told to come in. Reduced appetitex1 week.  Review of Systems  Positive: dizziness Negative: Chest pain  Physical Exam  BP 138/82 (BP Location: Right Arm)   Pulse 80   Temp 98.1 F (36.7 C) (Oral)   Resp 16   SpO2 100%  Gen:   Awake, no distress   Resp:  Normal effort  MSK:   Moves extremities without difficulty   Medical Decision Making  Medically screening exam initiated at 12:31 PM.  Appropriate orders placed.  Cindy Taylor was informed that the remainder of the evaluation will be completed by another provider, this initial triage assessment does not replace that evaluation, and the importance of remaining in the ED until their evaluation is complete.    Pete Pelt, [place] 03/31/23 1234

## 2023-03-31 NOTE — ED Provider Notes (Signed)
[place] EMERGENCY DEPARTMENT AT [facility] Provider Note   CSN: [id number] Arrival date & time: 03/31/23  1223     History  Chief Complaint  Patient presents with   Dehydration    Cindy Taylor is a 77 y.o. female.  77 year old female with a past medical history of HTN, HLD, T2DM who presents here for concerns of dehydration from her PCPs office.  Patient states that she has had 2 weeks of generalized weakness.  She states that she is only able to stand for short periods of time before becoming presyncopal.  She denies associated chest pain or shortness of breath with this.  At that time, she also endorses having urinary frequency.  She had the symptoms for about a week before they resolved.  She was diagnosed with shingles on her right buttock/right flank area on 12/6 and was started on medication at that time.  She has also had some suprapubic abdominal pain, which was previously attributed to her shingles infection.  She saw her PCP for follow-up appointment today, who was concerned that she may be dehydrated and was sent here for further evaluation.  Denies fevers, chills.  She does report poor appetite since being diagnosed with shingles.  Patient is also being monitored for unintentional weight loss by her PCP.  The history is provided by the patient and medical records.       Home Medications Prior to Admission medications   Medication Sig Start Date End Date Taking? Authorizing Provider  aspirin 81 MG tablet Take 1 tablet (81 mg total) by mouth daily. 10/22/16  Yes Funches, Josalyn, MD  Calcium Citrate-Vitamin D 250-200 MG-UNIT TABS Take 1 tablet by mouth 2 (two) times daily with a meal.   Yes [provider]  cephALEXin (KEFLEX) 500 MG capsule Take 1 capsule (500 mg total) by mouth 4 (four) times daily for 7 days. 03/31/23 04/07/23 Yes Rolla Flatten, MD  ibuprofen (ADVIL) 200 MG tablet Take 200-400 mg by mouth every 6 (six) hours as needed for  mild pain (pain score 1-3) or moderate pain (pain score 4-6).   Yes [provider]  LANTUS SOLOSTAR 100 UNIT/ML Solostar Pen INJECT 10 UNITS UNDER THE SKIN DAILY Patient taking differently: 10 Units daily. 02/04/23  Yes Marcine Matar, MD  lisinopril-hydrochlorothiazide (ZESTORETIC) 20-12.5 MG tablet Take 1/2 tablet daily PO Patient taking differently: Take 0.5 tablets by mouth daily. 03/31/23  Yes Marcine Matar, MD  metFORMIN (GLUCOPHAGE) 1000 MG tablet TAKE 1 TABLET(1000 MG) BY MOUTH in a.m and 1/2 tab in p.m Patient taking differently: Take 500 mg by mouth daily with breakfast. TAKE 1 TABLET(1000 MG) BY MOUTH in a.m and 1/2 tab in p.m 03/31/23  Yes Marcine Matar, MD  valACYclovir (VALTREX) 1000 MG tablet Take 1 tablet (1,000 mg total) by mouth 3 (three) times daily for 7 days. 03/25/23 04/01/23 Yes Rinaldo Ratel, [place] N, FNP  Blood Glucose Monitoring Suppl [facility] VERIO) w/Device KIT Check blood sugars once daily 07/03/18   Marcine Matar, MD  glucose blood (ONETOUCH VERIO) test strip TEST ONCE DAILY. E11.29 08/06/22   Marcine Matar, MD  Insulin Pen Needle (B-D ULTRAFINE III SHORT PEN) 31G X 8 MM MISC USE AS DIRECTED WITH INSULIN PEN 04/05/22   Marcine Matar, MD  metoprolol succinate (TOPROL-XL) 50 MG 24 hr tablet TAKE 1 AND 1/2 TABLETS BY MOUTH ONCE DAILY WITH OR IMMEDIATELY FOLLOWING A MEAL 03/31/23   Marcine Matar, MD  OneTouch Delica Lancets  33G MISC Check blood sugar fasting and before meals and again if pt feels bad (symptoms of hypo). 06/21/22   Marcine Matar, MD  pravastatin (PRAVACHOL) 40 MG tablet Take 1 tablet (40 mg total) by mouth daily. Patient not taking: Reported on 03/31/2023 03/31/23   Marcine Matar, MD      Allergies    Patient has no known allergies.    Review of Systems   As noted in HPI  Physical Exam Updated Vital Signs BP 138/82 (BP Location: Right Arm)   Pulse 80   Temp 98.1 F (36.7 C) (Oral)   Resp 16   Ht 5'  3" (1.6 m)   Wt 57.6 kg   SpO2 100%   BMI 22.50 kg/m  Physical Exam Vitals reviewed.  Constitutional:      General: She is not in acute distress.    Appearance: Normal appearance. She is normal weight. She is not ill-appearing, toxic-appearing or diaphoretic.  HENT:     Head: Normocephalic.     Nose: Nose normal.     Mouth/Throat:     Mouth: Mucous membranes are moist.  Eyes:     General: No visual field deficit or scleral icterus.    Extraocular Movements: Extraocular movements intact.     Conjunctiva/sclera: Conjunctivae normal.     Pupils: Pupils are equal, round, and reactive to light.  Cardiovascular:     Rate and Rhythm: Normal rate and regular rhythm.     Pulses:          Radial pulses are 2+ on the right side and 2+ on the left side.     Heart sounds: Normal heart sounds. No murmur heard.    No friction rub. No gallop.  Pulmonary:     Effort: Pulmonary effort is normal. No respiratory distress.     Breath sounds: Normal breath sounds. No wheezing, rhonchi or rales.  Abdominal:     General: There is no distension.     Palpations: Abdomen is soft.     Tenderness: There is no abdominal tenderness. There is no guarding or rebound.  Musculoskeletal:     Right lower leg: No edema.     Left lower leg: No edema.  Skin:    General: Skin is warm and dry.  Neurological:     Mental Status: She is alert.     Cranial Nerves: Cranial nerves 2-12 are intact. No cranial nerve deficit, dysarthria or facial asymmetry.     Sensory: Sensation is intact. No sensory deficit.     Motor: Motor function is intact. No weakness, tremor, abnormal muscle tone or pronator drift.     Coordination: Coordination is intact. Romberg sign negative. Finger-Nose-Finger Test normal.     Gait: Gait is intact. Gait normal.     ED Results / Procedures / Treatments   Labs (all labs ordered are listed, but only abnormal results are displayed) Labs Reviewed  CBC WITH DIFFERENTIAL/PLATELET - Abnormal;  Notable for the following components:      Result Value   RBC 5.21 (*)    All other components within normal limits  URINALYSIS, ROUTINE W REFLEX MICROSCOPIC - Abnormal; Notable for the following components:   APPearance HAZY (*)    Hgb urine dipstick SMALL (*)    Leukocytes,Ua LARGE (*)    Bacteria, UA RARE (*)    All other components within normal limits  COMPREHENSIVE METABOLIC PANEL  TROPONIN I (HIGH SENSITIVITY)  TROPONIN I (HIGH SENSITIVITY)    EKG  EKG Interpretation Date/Time:  Thursday March 31 2023 12:31:15 EST Ventricular Rate:  77 PR Interval:  158 QRS Duration:  78 QT Interval:  384 QTC Calculation: 434 R Axis:   -7  Text Interpretation: Normal sinus rhythm Normal ECG When compared with ECG of 30-Dec-2020 10:49, PREVIOUS ECG IS PRESENT No significant change since last tracing Confirmed by Richardean Canal [phone]) on 03/31/2023 4:43:12 PM  Radiology No results found.  Procedures Procedures    Medications Ordered in ED Medications  sodium chloride 0.9 % bolus 1,000 mL (0 mLs Intravenous Stopped 03/31/23 1839)  cefTRIAXone (ROCEPHIN) 1 g in sodium chloride 0.9 % 100 mL IVPB (0 g Intravenous Stopped 03/31/23 1829)    ED Course/ Medical Decision Making/ A&P Clinical Course as of 03/31/23 1922  Thu Mar 31, 2023  1920 ED EKG Sinus rhythm.  Rate of 77.  Normal intervals.  No axis deviation.  No ST segment changes.  Nonischemic ECG. [JR]    Clinical Course User Index [JR] Rolla Flatten, MD                                 Medical Decision Making Amount and/or Complexity of Data Reviewed Labs: ordered. ECG/medicine tests: ordered and independent interpretation performed. Decision-making details documented in ED Course.  Risk Prescription drug management. Decision regarding hospitalization.   77 year old female presents here from PCPs office for concerns of dehydration.  Patient endorsing generalized weakness for the last 2 weeks.  Also reporting some  urinary symptoms that started around that time.  She is afebrile and hemodynamically stable on presentation to the emergency department.  On exam, she is well-appearing and in no acute distress.  Neurologic exam without deficits.  Remainder of exam otherwise unremarkable.  Differential diagnosis includes arrhythmia, electrolyte abnormality, UTI, CVA, dehydration, AKI.  I independently interpreted the patient's ECG which is nonischemic on my review.  I independently interpreted the patient's labs, which are notable for UA with leukocytes, bacteria, pyuria.  In the setting of urinary symptoms, this would be consistent with UTI.  Remainder of workup, including CMP, CBC, and serial troponins are unremarkable.  Patient given 1 L fluid bolus due to poor oral intake over the last several days.  Her urinary tract infection was empirically treated with Rocephin.  Urine cultures were reviewed.  No cultures in patient's medical chart for review to guide treatment.  I considered arrhythmia as etiology of patient's symptoms.  However, ECG with NSR.  Considered CVA.  However, patient has no deficits on neurologic exam.  I suspect patient's symptoms are more likely due to heat infection with UTI rather than CVA.  No evidence of dehydration or AKI on laboratory workup.  No significant electrolyte abnormalities noted on laboratory workup.  I considered admission to the hospital.  However, patient has been hemodynamically stable here.  I feel that she is more appropriate for continued outpatient management.  Will plan to discharge the patient with course of Keflex for treatment of UTI.  Recommend follow-up with PCP in 1 week.  Return precautions were discussed with patient at the time of discharge.  Patient discharged in stable condition.  Patient's presentation is most consistent with acute complicated illness / injury requiring diagnostic workup.         Final Clinical Impression(s) / ED Diagnoses Final  diagnoses:  Urinary tract infection with hematuria, site unspecified    Rx / DC Orders ED Discharge Orders  Ordered    cephALEXin (KEFLEX) 500 MG capsule  4 times daily        03/31/23 1918              Rolla Flatten, MD 03/31/23 Dorene Sorrow, MD 04/05/23 [phone]

## 2023-03-31 NOTE — Patient Instructions (Signed)
Decrease Metformin to 1000 mg in a.m and 1/2 tablet in p.m  Stop Amlodipine.  Hold Lisinopril/hydrochlorothiazide for 2 days.  Restart it on Sunday but take only 1/2 tablet daily.  You are dehydrated.  Please be seen in the ER for fluids.

## 2023-03-31 NOTE — ED Notes (Signed)
Loel Ro, pt daughter called for a update  [phone]

## 2023-03-31 NOTE — ED Triage Notes (Signed)
Pt to ED from PCP , told to come to ED d/t dehydration. Reports decrease PO intake. Currently being treated for shingles

## 2023-03-31 NOTE — Discharge Instructions (Signed)
Please return to the emergency department if you have worsening weakness, dizziness, you begin to fall, you develop severe back pain/fever/chills.  Please follow-up with your primary care doctor in 1 week.

## 2023-03-31 NOTE — Progress Notes (Signed)
Patient ID: Cindy Taylor, female    DOB: [birth year]  MRN: [id number]  CC: Herpes Zoster (Shingles f/u - pt diagnosed at UC/Requesting work note excusing time off from work since 03/25/2023/)   Subjective: Cindy Taylor is a 77 y.o. female who presents for chronic ds management. Her concerns today include:  Pt with DM, HTN, HL, Hep C treated with Harvoni, snuff user, CKD 2-3    Discussed the use of AI scribe software for clinical note transcription with the patient, who gave verbal consent to proceed.  History of Present Illness   The patient, with a history of diabetes and hypertension, presents for a follow-up visit after being diagnosed with shingles.  Seen in urgent care for this 03/25/2023 and found to have shingles on the upper right buttock.  Prescribed Valtrex to take 3 times a day for 7 days.  She has bottle with her with several left.  She thinks she has skipped a few doses unintentionally.  She gets intermittent stinging and burning from the rash.  Was offered shingles vaccine in the past but had declined.  She is now wanting to get it if still indicated.  Reports episodes of dizziness when standing for more than a few minutes since being diagnosed with shingles.  She has also had some nausea since being diagnosed with the shingles.  When she stands to cook or at her sink for more than a few minutes, she has to sit down because she feels like she would pass out from the dizziness.  Episode lasts about 2 to 3 minutes.    Unintentional weight loss: Reports unexplained weight loss.  We last saw her 06/2022.  Weight has decreased 11 pounds since then.  The patient's appetite has been inconsistent, often eating only snacks throughout the day and one meal at dinner time.  -She denies any problems swallowing, fever, night sweats, abdominal pain, blood in the urine or stools.  Denies any issues with depression.  Bowel movements are infrequent.  -Referred for colonoscopy on last  visit as she was overdue and has history of precancerous polyps.  [facility] had tried calling her several times unsuccessfully.  Patient states she gets so many telemarketer calls during the day, she does not answer most calls -She is overdue for mammogram. -Does not smoke but chews snuff.  DM:  A1c today is 6.3/blood sugar 123 The patient has been taking Lantus insulin 10 units  and metformin 1 gram BID for diabetes, but reports feeling weak and nervous after taking the afternoon dose of metformin.  Checks blood sugars infrequently about once to twice a week with range being 93-127.  HTN: Reports compliance with taking amlodipine 2.5 mg daily, lisinopril/HCTZ 20/12.5 mg 1 tablet daily and Toprol-XL 75 mg daily.  She has been out of the lisinopril hydrochlorothiazide for several days.  Plans to pick up today.  She took metoprolol and amlodipine already for today.      HL: Reports being out of Pravachol for about a month.   Patient Active Problem List   Diagnosis Date Noted   Skin tag of rectum 07/17/2021   Microalbuminuria 09/30/2020   Unintentional weight loss 10/22/2016   Chews tobacco 01/13/2016   Onychomycosis of multiple toenails with type 2 diabetes mellitus (HCC) 01/13/2016   History of hepatitis C 05/23/2014   DM type 2 (diabetes mellitus, type 2) (HCC) 11/16/2012   Hyperlipidemia associated with type 2 diabetes mellitus (HCC) 11/16/2012   Essential hypertension, benign 12/12/2008  Current Outpatient Medications on File Prior to Visit  Medication Sig Dispense Refill   aspirin 81 MG tablet Take 1 tablet (81 mg total) by mouth daily. 90 tablet 3   Blood Glucose Monitoring Suppl (ONETOUCH VERIO) w/Device KIT Check blood sugars once daily 1 kit 0   Calcium Citrate-Vitamin D 250-200 MG-UNIT TABS Take 1 tablet by mouth 2 (two) times daily with a meal.     ciclopirox (PENLAC) 8 % solution Apply topically at bedtime. Apply over nail and surrounding skin. Apply daily  over previous coat. After seven (7) days, may remove with alcohol and continue cycle. 6.6 mL 0   glucose blood (ONETOUCH VERIO) test strip TEST ONCE DAILY. E11.29 100 strip 3   Insulin Pen Needle (B-D ULTRAFINE III SHORT PEN) 31G X 8 MM MISC USE AS DIRECTED WITH INSULIN PEN 100 each 2   LANTUS SOLOSTAR 100 UNIT/ML Solostar Pen INJECT 10 UNITS UNDER THE SKIN DAILY 15 mL 5   OneTouch Delica Lancets 33G MISC Check blood sugar fasting and before meals and again if pt feels bad (symptoms of hypo). 100 each 12   valACYclovir (VALTREX) 1000 MG tablet Take 1 tablet (1,000 mg total) by mouth 3 (three) times daily for 7 days. 21 tablet 0   No current facility-administered medications on file prior to visit.    No Known Allergies  Social History   Socioeconomic History   Marital status: Single    Spouse name: Not on file   Number of children: Not on file   Years of education: Not on file   Highest education level: Not on file  Occupational History   Not on file  Tobacco Use   Smoking status: Never   Smokeless tobacco: Current    Types: Snuff  Vaping Use   Vaping status: Never Used  Substance and Sexual Activity   Alcohol use: No    Comment: glass of wine at special occasion   Drug use: No   Sexual activity: Not Currently  Other Topics Concern   Not on file  Social History Narrative   Not on file   Social Drivers of Health   Financial Resource Strain: High Risk (03/31/2023)   Overall Financial Resource Strain (CARDIA)    Difficulty of Paying Living Expenses: Hard  Food Insecurity: Food Insecurity Present (03/31/2023)   Hunger Vital Sign    Worried About Running Out of Food in the Last Year: Sometimes true    Ran Out of Food in the Last Year: Sometimes true  Transportation Needs: No Transportation Needs (03/31/2023)   PRAPARE - Administrator, Civil Service (Medical): No    Lack of Transportation (Non-Medical): No  Physical Activity: Inactive (03/31/2023)   Exercise  Vital Sign    Days of Exercise per Week: 0 days    Minutes of Exercise per Session: 0 min  Stress: No Stress Concern Present (03/31/2023)   [employer] of Occupational Health - Occupational Stress Questionnaire    Feeling of Stress : Not at all  Social Connections: Moderately Integrated (03/31/2023)   Social Connection and Isolation Panel [NHANES]    Frequency of Communication with Friends and Family: More than three times a week    Frequency of Social Gatherings with Friends and Family: Once a week    Attends Religious Services: More than 4 times per year    Active Member of [employer] or Organizations: Yes    Attends Banker Meetings: Never    Marital Status: Widowed  Intimate Partner Violence: Not At Risk (03/31/2023)   Humiliation, Afraid, Rape, and Kick questionnaire    Fear of Current or Ex-Partner: No    Emotionally Abused: No    Physically Abused: No    Sexually Abused: No    Family History  Problem Relation Age of Onset   Stroke Mother    Rectal cancer Mother    Colon polyps Neg Hx    Colon cancer Neg Hx    Esophageal cancer Neg Hx    Stomach cancer Neg Hx     Past Surgical History:  Procedure Laterality Date   TOTAL ABDOMINAL HYSTERECTOMY     TUBAL LIGATION      ROS: Review of Systems Negative except as stated above  PHYSICAL EXAM: BP 101/66 (BP Location: Left Arm, Patient Position: Sitting, Cuff Size: Normal)   Pulse 80   Temp 98.2 F (36.8 C) (Oral)   Ht 5\' 4"  (1.626 m)   Wt 127 lb (57.6 kg)   SpO2 99%   BMI 21.80 kg/m   Wt Readings from Last 3 Encounters:  03/31/23 127 lb (57.6 kg)  03/31/23 127 lb (57.6 kg)  03/21/23 134 lb (60.8 kg)  Sitting BP 118/78, pulse 82 Standing: BP 93/63, pulse 94  Physical Exam  General appearance - alert, well appearing, and in no distress Mental status - normal mood, behavior, speech, dress, motor activity, and thought processes Mouth -oral mucosa is mildly dry. Neck - supple, no significant  adenopathy Chest - clear to auscultation, no wheezes, rales or rhonchi, symmetric air entry Heart - normal rate, regular rhythm, normal S1, S2, no murmurs, rubs, clicks or gallops Abdomen - soft, nontender, nondistended, no masses or organomegaly Breasts - breasts appear normal, no suspicious masses, no skin or nipple changes or axillary nodes Extremities - peripheral pulses normal, no pedal edema, no clubbing or cyanosis Skin -skin is dry with tenting She has scabbed over rash on the upper part of the right buttock in a dermatomal pattern     Latest Ref Rng & Units 06/21/2022   11:47 AM 12/25/2021   11:11 AM 12/30/2020   11:47 AM  CMP  Glucose 70 - 99 mg/dL 161  096  045   BUN 8 - 27 mg/dL 16  13  11    Creatinine 0.57 - 1.00 mg/dL 4.09  8.11  9.14   Sodium 134 - 144 mmol/L 140  143  137   Potassium 3.5 - 5.2 mmol/L 4.4  4.6  3.7   Chloride 96 - 106 mmol/L 102  103  102   CO2 20 - 29 mmol/L 23  24  26    Calcium 8.7 - 10.3 mg/dL 9.5  78.2  9.2   Total Protein 6.0 - 8.5 g/dL  7.7    Total Bilirubin 0.0 - 1.2 mg/dL  0.3    Alkaline Phos 44 - 121 IU/L  57    AST 0 - 40 IU/L  18    ALT 0 - 32 IU/L  12     Lipid Panel     Component Value Date/Time   CHOL 158 12/25/2021 1111   TRIG 63 12/25/2021 1111   HDL 67 12/25/2021 1111   CHOLHDL 2.4 12/25/2021 1111   CHOLHDL 2.8 07/29/2015 0918   VLDL 16 07/29/2015 0918   LDLCALC 78 12/25/2021 1111   LDLDIRECT 71 12/03/2009 2014    CBC    Component Value Date/Time   WBC 5.4 12/25/2021 1111   WBC 4.6 12/30/2020 1147  RBC 5.05 12/25/2021 1111   RBC 4.92 12/30/2020 1147   HGB 13.4 12/25/2021 1111   HCT 41.2 12/25/2021 1111   PLT 204 12/25/2021 1111   MCV 82 12/25/2021 1111   MCH 26.5 (L) 12/25/2021 1111   MCH 26.6 12/30/2020 1147   MCHC 32.5 12/25/2021 1111   MCHC 32.4 12/30/2020 1147   RDW 13.8 12/25/2021 1111   LYMPHSABS 3.9 07/24/2013 1741   MONOABS 0.6 07/24/2013 1741   EOSABS 0.1 07/24/2013 1741   BASOSABS 0.0 07/24/2013 1741     ASSESSMENT AND PLAN: 1. Herpes zoster without complication (Primary) Resolving.  Patient would like to get shingles vaccine if still indicated.  I told her that we will give it about a month out once everything is healed and scabbed over.  She is having some stinging/burning pain intermittently.  I told her that we can give a low-dose of gabapentin to take at bedtime for a few weeks. Work excuse given until Monday of next week to return on Tuesday.  2. Dizziness Orthostatic on blood pressure/pulse check.  She also appears dehydrated.  We did not have any fluids to give her here so patient sent to the emergency room to get intravenous fluids. -Stop Norvasc. -Hold lisinopril/HCTZ 20/12.5 for 2 days then restarted taking just half a tablet daily.  Continue Toprol. Will have her follow-up in 1 month to see how she is doing  3. Orthostatic hypotension See #2 above  4. Type 2 diabetes mellitus with microalbuminuria, with long-term current use of insulin (HCC) At goal.  However patient reports low blood sugars in the evenings after taking second dose of metformin.  Advised to continue Lantus insulin 10 units daily.  Decrease metformin to 1 g in the morning and half a tablet in the evening. - CBC - Comprehensive metabolic panel - Lipid panel - Microalbumin / creatinine urine ratio  5. Insulin long-term use (HCC) 6. Diabetes mellitus treated with oral medication (HCC) See #4 above  7. Hyperlipidemia associated with type 2 diabetes mellitus (HCC) Continue pravastatin.  Refill sent.  8. Screening for colon cancer Discussed the importance of getting up-to-date with age-appropriate cancer screenings given her unintentional weight loss.  Will refer to [facility] again.  9. Encounter for screening mammogram for malignant neoplasm of breast See #8 above - MM Digital Screening; Future  10. Unintentional weight loss Encourage to eat smaller but more frequent meals.  She denies  any depression contributing to the decreased appetite and weight loss.  We need to get her up-to-date with age-appropriate cancer screenings. - TSH+T4F+T3Free  11. History of colon polyps Refer to GI for colonoscopy.  I spent 40 minutes dedicated to the care of this patient including previsit review of records, face-to-face time with patient discussing diagnosis and management and post visit entering of orders.  Patient was given the opportunity to ask questions.  Patient verbalized understanding of the plan and was able to repeat key elements of the plan.   This documentation was completed using Paediatric nurse.  Any transcriptional errors are unintentional.  Orders Placed This Encounter  Procedures   MM Digital Screening   CBC   Comprehensive metabolic panel   Lipid panel   Microalbumin / creatinine urine ratio   TSH+T4F+T3Free   Ambulatory referral to Gastroenterology     Requested Prescriptions   Signed Prescriptions Disp Refills   lisinopril-hydrochlorothiazide (ZESTORETIC) 20-12.5 MG tablet 45 tablet 1    Sig: Take 1/2 tablet daily PO   pravastatin (PRAVACHOL)  40 MG tablet 90 tablet 1    Sig: Take 1 tablet (40 mg total) by mouth daily.   metFORMIN (GLUCOPHAGE) 1000 MG tablet 135 tablet 1    Sig: TAKE 1 TABLET(1000 MG) BY MOUTH in a.m and 1/2 tab in p.m   metoprolol succinate (TOPROL-XL) 50 MG 24 hr tablet 135 tablet 1    Sig: TAKE 1 AND 1/2 TABLETS BY MOUTH ONCE DAILY WITH OR IMMEDIATELY FOLLOWING A MEAL    Return in about 4 weeks (around 04/28/2023).  Jonah Blue, MD, FACP

## 2023-04-01 ENCOUNTER — Telehealth: Payer: Self-pay

## 2023-04-01 NOTE — Telephone Encounter (Signed)
Letter ready for pickup.  She is to return to work on the 17th.

## 2023-04-01 NOTE — Telephone Encounter (Signed)
Copied from CRM [id number]. Topic: General - Other >> Apr 01, 2023 10:48 AM Franchot Heidelberg wrote: Reason for CRM: Pt called requesting her doctor's note from yesterday, please advise when she is able to come and retrieve this.

## 2023-04-01 NOTE — Telephone Encounter (Signed)
Called but no answer. LVM informing that letter is ready for pick-up. Letter placed at front office.

## 2023-04-02 LAB — COMPREHENSIVE METABOLIC PANEL
ALT: 11 [IU]/L (ref 0–32)
AST: 19 [IU]/L (ref 0–40)
Albumin: 4.6 g/dL (ref 3.8–4.8)
Alkaline Phosphatase: 63 [IU]/L (ref 44–121)
BUN/Creatinine Ratio: 20 (ref 12–28)
BUN: 18 mg/dL (ref 8–27)
Bilirubin Total: 0.3 mg/dL (ref 0.0–1.2)
CO2: 24 mmol/L (ref 20–29)
Calcium: 9.8 mg/dL (ref 8.7–10.3)
Chloride: 101 mmol/L (ref 96–106)
Creatinine, Ser: 0.89 mg/dL (ref 0.57–1.00)
Globulin, Total: 2.9 g/dL (ref 1.5–4.5)
Glucose: 97 mg/dL (ref 70–99)
Potassium: 4.7 mmol/L (ref 3.5–5.2)
Sodium: 141 mmol/L (ref 134–144)
Total Protein: 7.5 g/dL (ref 6.0–8.5)
eGFR: 68 mL/min/{1.73_m2} (ref >59)

## 2023-04-02 LAB — LIPID PANEL
Chol/HDL Ratio: 4.1 {ratio} (ref 0.0–4.4)
Cholesterol, Total: 231 mg/dL — ABNORMAL HIGH (ref 100–199)
HDL: 56 mg/dL (ref >39)
LDL Chol Calc (NIH): 160 mg/dL — ABNORMAL HIGH (ref 0–99)
Triglycerides: 87 mg/dL (ref 0–149)
VLDL Cholesterol Cal: 15 mg/dL (ref 5–40)

## 2023-04-02 LAB — CBC
Hematocrit: 45.9 % (ref 34.0–46.6)
Hemoglobin: 14.1 g/dL (ref 11.1–15.9)
MCH: 25.7 pg — ABNORMAL LOW (ref 26.6–33.0)
MCHC: 30.7 g/dL — ABNORMAL LOW (ref 31.5–35.7)
MCV: 84 fL (ref 79–97)
Platelets: 200 10*3/uL (ref 150–450)
RBC: 5.49 x10E6/uL — ABNORMAL HIGH (ref 3.77–5.28)
RDW: 13.5 % (ref 11.7–15.4)
WBC: 5.5 10*3/uL (ref 3.4–10.8)

## 2023-04-02 LAB — TSH+T4F+T3FREE
Free T4: 1.28 ng/dL (ref 0.82–1.77)
T3, Free: 2.6 pg/mL (ref 2.0–4.4)
TSH: 1.85 u[IU]/mL (ref 0.450–4.500)

## 2023-04-02 LAB — MICROALBUMIN / CREATININE URINE RATIO
Creatinine, Urine: 126.6 mg/dL
Microalb/Creat Ratio: 8 mg/g{creat} (ref 0–29)
Microalbumin, Urine: 10.5 ug/mL

## 2023-04-04 ENCOUNTER — Other Ambulatory Visit: Payer: Self-pay | Admitting: Internal Medicine

## 2023-04-04 DIAGNOSIS — Z1231 Encounter for screening mammogram for malignant neoplasm of breast: Secondary | ICD-10-CM

## 2023-04-04 NOTE — Telephone Encounter (Signed)
Called & spoke to the patient. Verified name & DOB. Informed that letter is ready for pick-up. Patient expressed verbal understanding.

## 2023-04-05 ENCOUNTER — Telehealth: Payer: Self-pay

## 2023-04-05 NOTE — Telephone Encounter (Signed)
Pt called and results of lab work given. Pt is needing a hospital f/u appt from an ED visit on 03/31/23. No appts seen. Pt unable to come to office today. Pt has an appt for January. Can pt be worked in.

## 2023-04-05 NOTE — Telephone Encounter (Signed)
-----   Message from Jonah Blue sent at 04/02/2023  6:27 PM EST ----- Let patient know that her blood cell counts are good. Kidney and liver function tests are okay. Thyroid hormone levels normal. No abnormal amounts of protein in the urine. Cholesterol levels are elevated.  She reported being out of Pravachol on recent visit.  I have sent refill to her pharmacy.

## 2023-04-05 NOTE — Telephone Encounter (Signed)
Pt was called and vm was left, Information has been sent to nurse pool.

## 2023-04-05 NOTE — Telephone Encounter (Signed)
No sooner appointments noted on the schedule.

## 2023-05-02 ENCOUNTER — Ambulatory Visit: Payer: Medicare Other | Admitting: Internal Medicine

## 2023-05-02 ENCOUNTER — Ambulatory Visit: Payer: Medicare Other

## 2023-05-03 ENCOUNTER — Ambulatory Visit
Admission: RE | Admit: 2023-05-03 | Discharge: 2023-05-03 | Disposition: A | Discharge status: Home / Self Care | Payer: Medicare Other | Source: Ambulatory Visit | Attending: Internal Medicine | Admitting: Internal Medicine

## 2023-05-03 ENCOUNTER — Encounter: Payer: Self-pay | Admitting: Internal Medicine

## 2023-05-03 ENCOUNTER — Ambulatory Visit: Payer: Medicare Other | Attending: Internal Medicine | Admitting: Internal Medicine

## 2023-05-03 VITALS — BP 120/64 | HR 67 | Temp 97.8°F | Ht 63.0 in | Wt 132.0 lb

## 2023-05-03 DIAGNOSIS — R634 Abnormal weight loss: Secondary | ICD-10-CM

## 2023-05-03 DIAGNOSIS — Z23 Encounter for immunization: Secondary | ICD-10-CM | POA: Diagnosis not present

## 2023-05-03 DIAGNOSIS — Z1231 Encounter for screening mammogram for malignant neoplasm of breast: Secondary | ICD-10-CM

## 2023-05-03 DIAGNOSIS — I152 Hypertension secondary to endocrine disorders: Secondary | ICD-10-CM | POA: Diagnosis not present

## 2023-05-03 DIAGNOSIS — E1159 Type 2 diabetes mellitus with other circulatory complications: Secondary | ICD-10-CM | POA: Diagnosis not present

## 2023-05-03 DIAGNOSIS — Z8619 Personal history of other infectious and parasitic diseases: Secondary | ICD-10-CM

## 2023-05-03 MED ORDER — ZOSTER VAC RECOMB ADJUVANTED 50 MCG/0.5ML IM SUSR: 0.5000 mL | Freq: Once | INTRAMUSCULAR | 0 refills | Status: AC

## 2023-05-03 NOTE — Patient Instructions (Signed)
 Please call [facility] to schedule your colonoscopy with Dr. Rhea Belton.  We submitted the referral in December.  [address] [place], [place] [address] PH# [phone]

## 2023-05-03 NOTE — Progress Notes (Signed)
 Patient ID: Cindy Taylor, female    DOB: 04/13/1946  MRN: [id number]  CC: Follow-up (Follow-up. Med refill. /No questions / concerns/Yes to flu & shingles vax.)   Subjective: Cindy Taylor is a 78 y.o. female who presents for 1 mth f/u from last visit. Her concerns today include:  Pt with DM, HTN, HL, Hep C treated with Harvoni, snuff user, CKD 2-3, shingles 03/2023   On last visit, patient had complained of dizziness and near fainting episodes that were occurring in association with recently diagnosed shingles.  She was orthostatic on blood pressure check.  She was sent to the emergency room where she was given intravenous fluids.  She was also found to have UTI and discharged with antibiotics which she has completed. -We had stopped amlodipine  and told her to hold the lisinopril /HCTZ 20/12.5 mg for 2 days then restarted at half a tablet a day.  She was to continue the Toprol . -Her A1c was 6.3.  She had reported feeling weak and nervous when taking the afternoon dose of metformin .  We had her change metformin  to 1 g in the morning and 500 mg in the evening.  She was told to continue Lantus  insulin  10 units daily. -There was also concern about unintentional weight loss.  TSH was normal.  Referred from mammography that is being scheduled.  Also referred to gastroenterology for colonoscopy.  Today: Feeling a lot better.  No further near syncope or dizziness. Completed abx for UTI. Gained 5 lbs since last visit.  Reports appetite much better. Had MMG today; I await the report. No call as yet from [facility] for scheduling c-scope Tolerating lower dose of Metformin  in the evening.    HTN: she was suppose to stop the Norvasc ; she thinks she stopped it but not sure.  Does not have meds with her today. She did decrease the dose of Lis/hydrochlorothiazide  to 1/2 tab. No device to check BP. Continued Toprol   Shingles has resolved. Wants to get shingles vaccine.  She wants flu  vaccine. Patient Active Problem List   Diagnosis Date Noted   Skin tag of rectum 07/17/2021   Microalbuminuria 09/30/2020   Unintentional weight loss 10/22/2016   Chews tobacco 01/13/2016   Onychomycosis of multiple toenails with type 2 diabetes mellitus (HCC) 01/13/2016   History of hepatitis C 05/23/2014   DM type 2 (diabetes mellitus, type 2) (HCC) 11/16/2012   Hyperlipidemia associated with type 2 diabetes mellitus (HCC) 11/16/2012   Essential hypertension, benign 12/12/2008     Current Outpatient Medications on File Prior to Visit  Medication Sig Dispense Refill   aspirin  81 MG tablet Take 1 tablet (81 mg total) by mouth daily. 90 tablet 3   Blood Glucose Monitoring Suppl (ONETOUCH VERIO) w/Device KIT Check blood sugars once daily 1 kit 0   Calcium  Citrate-Vitamin D 250-200 MG-UNIT TABS Take 1 tablet by mouth 2 (two) times daily with a meal.     glucose blood (ONETOUCH VERIO) test strip TEST ONCE DAILY. E11.29 100 strip 3   Insulin  Pen Needle (B-D ULTRAFINE III SHORT PEN) 31G X 8 MM MISC USE AS DIRECTED WITH INSULIN  PEN 100 each 2   LANTUS  SOLOSTAR 100 UNIT/ML Solostar Pen INJECT 10 UNITS UNDER THE SKIN DAILY (Patient taking differently: 10 Units daily.) 15 mL 5   lisinopril -hydrochlorothiazide  (ZESTORETIC ) 20-12.5 MG tablet Take 1/2 tablet daily PO (Patient taking differently: Take 0.5 tablets by mouth daily.) 45 tablet 1   metFORMIN  (GLUCOPHAGE ) 1000 MG tablet TAKE 1  TABLET(1000 MG) BY MOUTH in a.m and 1/2 tab in p.m (Patient taking differently: Take 500 mg by mouth daily with breakfast. TAKE 1 TABLET(1000 MG) BY MOUTH in a.m and 1/2 tab in p.m) 135 tablet 1   metoprolol  succinate (TOPROL -XL) 50 MG 24 hr tablet TAKE 1 AND 1/2 TABLETS BY MOUTH ONCE DAILY WITH OR IMMEDIATELY FOLLOWING A MEAL 135 tablet 1   OneTouch Delica Lancets 33G MISC Check blood sugar fasting and before meals and again if pt feels bad (symptoms of hypo). 100 each 12   pravastatin  (PRAVACHOL ) 40 MG tablet Take 1  tablet (40 mg total) by mouth daily. 90 tablet 1   ibuprofen  (ADVIL ) 200 MG tablet Take 200-400 mg by mouth every 6 (six) hours as needed for mild pain (pain score 1-3) or moderate pain (pain score 4-6). (Patient not taking: Reported on 05/03/2023)     No current facility-administered medications on file prior to visit.    No Known Allergies  Social History   Socioeconomic History   Marital status: Single    Spouse name: Not on file   Number of children: Not on file   Years of education: Not on file   Highest education level: Not on file  Occupational History   Not on file  Tobacco Use   Smoking status: Never   Smokeless tobacco: Current    Types: Snuff  Vaping Use   Vaping status: Never Used  Substance and Sexual Activity   Alcohol use: No    Comment: glass of wine at special occasion   Drug use: No   Sexual activity: Not Currently  Other Topics Concern   Not on file  Social History Narrative   Not on file   Social Drivers of Health   Financial Resource Strain: High Risk (03/31/2023)   Overall Financial Resource Strain (CARDIA)    Difficulty of Paying Living Expenses: Hard  Food Insecurity: Food Insecurity Present (03/31/2023)   Hunger Vital Sign    Worried About Running Out of Food in the Last Year: Sometimes true    Ran Out of Food in the Last Year: Sometimes true  Transportation Needs: No Transportation Needs (03/31/2023)   PRAPARE - Administrator, Civil Service (Medical): No    Lack of Transportation (Non-Medical): No  Physical Activity: Inactive (03/31/2023)   Exercise Vital Sign    Days of Exercise per Week: 0 days    Minutes of Exercise per Session: 0 min  Stress: No Stress Concern Present (03/31/2023)   [employer] of Occupational Health - Occupational Stress Questionnaire    Feeling of Stress : Not at all  Social Connections: Moderately Integrated (03/31/2023)   Social Connection and Isolation Panel [NHANES]    Frequency of  Communication with Friends and Family: More than three times a week    Frequency of Social Gatherings with Friends and Family: Once a week    Attends Religious Services: More than 4 times per year    Active Member of [employer] or Organizations: Yes    Attends Banker Meetings: Never    Marital Status: Widowed  Intimate Partner Violence: Not At Risk (03/31/2023)   Humiliation, Afraid, Rape, and Kick questionnaire    Fear of Current or Ex-Partner: No    Emotionally Abused: No    Physically Abused: No    Sexually Abused: No    Family History  Problem Relation Age of Onset   Stroke Mother    Rectal cancer Mother  Colon polyps Neg Hx    Colon cancer Neg Hx    Esophageal cancer Neg Hx    Stomach cancer Neg Hx     Past Surgical History:  Procedure Laterality Date   TOTAL ABDOMINAL HYSTERECTOMY     TUBAL LIGATION      ROS: Review of Systems Negative except as stated above  PHYSICAL EXAM: BP 120/64 (BP Location: Left Arm, Patient Position: Sitting, Cuff Size: Normal)   Pulse 67   Temp 97.8 F (36.6 C) (Oral)   Ht 5' 3 (1.6 m)   Wt 132 lb (59.9 kg)   SpO2 99%   BMI 23.38 kg/m   Wt Readings from Last 3 Encounters:  05/03/23 132 lb (59.9 kg)  03/31/23 127 lb (57.6 kg)  03/31/23 127 lb (57.6 kg)    Physical Exam  General appearance - alert, well appearing, older female and in no distress Mental status - normal mood, behavior, speech, dress, motor activity, and thought processes Mouth - mucous membranes moist, pharynx normal without lesions Chest - clear to auscultation, no wheezes, rales or rhonchi, symmetric air entry Heart - normal rate, regular rhythm, normal S1, S2, no murmurs, rubs, clicks or gallops      Latest Ref Rng & Units 03/31/2023    1:43 PM 03/31/2023   12:39 PM 06/21/2022   11:47 AM  CMP  Glucose 70 - 99 mg/dL 97  95  895   BUN 8 - 27 mg/dL 18  17  16    Creatinine 0.57 - 1.00 mg/dL 9.10  9.18  9.03   Sodium 134 - 144 mmol/L 141  136  140    Potassium 3.5 - 5.2 mmol/L 4.7  4.1  4.4   Chloride 96 - 106 mmol/L 101  102  102   CO2 20 - 29 mmol/L 24  26  23    Calcium  8.7 - 10.3 mg/dL 9.8  9.5  9.5   Total Protein 6.0 - 8.5 g/dL 7.5  7.6    Total Bilirubin 0.0 - 1.2 mg/dL 0.3  0.5    Alkaline Phos 44 - 121 IU/L 63  43    AST 0 - 40 IU/L 19  19    ALT 0 - 32 IU/L 11  14     Lipid Panel     Component Value Date/Time   CHOL 231 (H) 03/31/2023 1343   TRIG 87 03/31/2023 1343   HDL 56 03/31/2023 1343   CHOLHDL 4.1 03/31/2023 1343   CHOLHDL 2.8 07/29/2015 0918   VLDL 16 07/29/2015 0918   LDLCALC 160 (H) 03/31/2023 1343   LDLDIRECT 71 12/03/2009 2014    CBC    Component Value Date/Time   WBC 5.5 03/31/2023 1343   WBC 6.1 03/31/2023 1239   RBC 5.49 (H) 03/31/2023 1343   RBC 5.21 (H) 03/31/2023 1239   HGB 14.1 03/31/2023 1343   HGB 13.9 03/31/2023 1239   HCT 45.9 03/31/2023 1343   HCT 42.6 03/31/2023 1239   PLT 200 03/31/2023 1343   PLT 218 03/31/2023 1239   MCV 84 03/31/2023 1343   MCV 81.8 03/31/2023 1239   MCH 25.7 (L) 03/31/2023 1343   MCH 26.7 03/31/2023 1239   MCHC 30.7 (L) 03/31/2023 1343   MCHC 32.6 03/31/2023 1239   RDW 13.5 03/31/2023 1343   RDW 13.8 03/31/2023 1239   LYMPHSABS 3.4 03/31/2023 1239   MONOABS 0.4 03/31/2023 1239   EOSABS 0.1 03/31/2023 1239   BASOSABS 0.0 03/31/2023 1239    ASSESSMENT AND  PLAN: 1. Unintentional weight loss (Primary) Up 5 lbs since last visit.  She reports that her appetite has returned.  Encouraged her to continue trying to eat her 3 meals a day.  I will await the results of her mammogram report.  I have given her the phone number for [facility] gastroenterology so that she can call and schedule appointment with Dr. Albertus for her colonoscopy.  Referral was submitted on last visit -Let me know if her weight loss starts to occur again as she would need imaging studies of chest abdomen and pelvis.  She expressed understanding.  2. Hypertension associated with diabetes  (HCC) At goal.  Continue lisinopril /HCTZ half a tablet daily and Toprol .  3. History of shingles This has resolved.  She would like to pursue getting the shingles vaccine series.  Prescription given today for the first Shingrix shot.  Advised to take it to any outside pharmacy.  Will need to have the second shot in 2 to 6 months.  4. Need for influenza vaccination Flu vaccine given today  5. Need for shingles vaccine - Zoster Vaccine Adjuvanted [facility]) injection; Inject 0.5 mLs into the muscle once for 1 dose.  Dispense: 0.5 mL; Refill: 0    Patient was given the opportunity to ask questions.  Patient verbalized understanding of the plan and was able to repeat key elements of the plan.   This documentation was completed using Paediatric nurse.  Any transcriptional errors are unintentional.  Orders Placed This Encounter  Procedures   Flu Vaccine Trivalent High Dose (Fluad)     Requested Prescriptions   Signed Prescriptions Disp Refills   Zoster Vaccine Adjuvanted [facility]) injection 0.5 mL 0    Sig: Inject 0.5 mLs into the muscle once for 1 dose.    Return in about 4 months (around 08/31/2023).  Barnie Louder, MD, FACP

## 2023-05-10 ENCOUNTER — Other Ambulatory Visit: Payer: Self-pay | Admitting: Internal Medicine

## 2023-05-10 DIAGNOSIS — E1169 Type 2 diabetes mellitus with other specified complication: Secondary | ICD-10-CM

## 2023-05-10 DIAGNOSIS — E1159 Type 2 diabetes mellitus with other circulatory complications: Secondary | ICD-10-CM

## 2023-05-11 ENCOUNTER — Other Ambulatory Visit: Payer: Self-pay | Admitting: Internal Medicine

## 2023-05-11 NOTE — Telephone Encounter (Signed)
Unable to refill per protocol, Rx expired.  Amlodipine discontinued. Pravastatin is too soon for refill.  Requested Prescriptions  Pending Prescriptions Disp Refills   amLODipine (NORVASC) 2.5 MG tablet [Pharmacy Med Name: AMLODIPINE BESYLATE 2.5MG  TABLETS] 30 tablet 0    Sig: TAKE 1 TABLET(2.5 MG) BY MOUTH DAILY     Cardiovascular: Calcium Channel Blockers 2 Passed - 05/11/2023  9:14 AM      Passed - Last BP in normal range    BP Readings from Last 1 Encounters:  05/03/23 120/64         Passed - Last Heart Rate in normal range    Pulse Readings from Last 1 Encounters:  05/03/23 67         Passed - Valid encounter within last 6 months    Recent Outpatient Visits           1 week ago Unintentional weight loss   [facility] [facility] [place] - A Dept Of [place]. [facility] Marcine Matar, MD   1 month ago Herpes zoster without complication   [facility] [facility] - A Dept Of [place]. [facility] Jonah Blue B, MD   10 months ago Type 2 diabetes mellitus with microalbuminuria, with long-term current use of insulin [facility])   [place] [place] [facility] - A Dept Of [place]. [facility] Jonah Blue B, MD   1 year ago Type 2 diabetes mellitus with microalbuminuria, with long-term current use of insulin [facility])   [place] [facility] - A Dept Of [place]. [facility] Jonah Blue B, MD   1 year ago Type 2 diabetes mellitus without complication, with long-term current use of insulin (HCC)   [place] [facility] - A Dept Of [place]. [facility], Marzella Schlein, [place]       Future Appointments             In 3 months Laural Benes, Binnie Rail, MD [facility] - A Dept Of Eligha Bridegroom. [facility]             pravastatin (PRAVACHOL) 40 MG tablet [Pharmacy Med Name: PRAVASTATIN 40MG  TABLETS] 90 tablet 1    Sig: TAKE 1 TABLET BY MOUTH EVERY DAY      Cardiovascular:  Antilipid - Statins Failed - 05/11/2023  9:14 AM      Failed - Lipid Panel in normal range within the last 12 months    Cholesterol, Total  Date Value Ref Range Status  03/31/2023 231 (H) 100 - 199 mg/dL Final   LDL Chol Calc (NIH)  Date Value Ref Range Status  03/31/2023 160 (H) 0 - 99 mg/dL Final   Direct LDL  Date Value Ref Range Status  12/03/2009 71 mg/dL Final    Comment:    See lab report for associated comment(s)   HDL  Date Value Ref Range Status  03/31/2023 56 >39 mg/dL Final   Triglycerides  Date Value Ref Range Status  03/31/2023 87 0 - 149 mg/dL Final         Passed - Patient is not pregnant      Passed - Valid encounter within last 12 months    Recent Outpatient Visits           1 week ago Unintentional weight loss   [place] [facility] [place] - A Dept Of . [facility] Antoine, [place] B,  MD   1 month ago Herpes zoster without complication   [place] [facility] [place] - A Dept Of [place]. [facility] Jonah Blue B, MD   10 months ago Type 2 diabetes mellitus with microalbuminuria, with long-term current use of insulin [facility])   [place] [facility] - A Dept Of [place]. [facility] Jonah Blue B, MD   1 year ago Type 2 diabetes mellitus with microalbuminuria, with long-term current use of insulin [facility])   [place] [facility] - A Dept Of [place]. [facility] Jonah Blue B, MD   1 year ago Type 2 diabetes mellitus without complication, with long-term current use of insulin (HCC)   [place] [facility] - A Dept Of [place]. [facility] [place], Marzella Schlein, [place]       Future Appointments             In 3 months Laural Benes, Binnie Rail, MD [facility] - A Dept Of Eligha Bridegroom. [facility]

## 2023-05-11 NOTE — Telephone Encounter (Signed)
Request is too soon for refill, last refill 03/31/23 for 90 and 1 refill.  Requested Prescriptions  Pending Prescriptions Disp Refills   lisinopril-hydrochlorothiazide (ZESTORETIC) 20-12.5 MG tablet [Pharmacy Med Name: LISINOPRIL-HCTZ 20/12.5MG  TABLETS] 30 tablet     Sig: TAKE 1 TABLET BY MOUTH DAILY     Cardiovascular:  ACEI + Diuretic Combos Passed - 05/11/2023 10:12 AM      Passed - Na in normal range and within 180 days    Sodium  Date Value Ref Range Status  03/31/2023 141 134 - 144 mmol/L Final  03/31/2023 136 135 - 145 mmol/L Final         Passed - K in normal range and within 180 days    Potassium  Date Value Ref Range Status  03/31/2023 4.7 3.5 - 5.2 mmol/L Final         Passed - Cr in normal range and within 180 days    Creat  Date Value Ref Range Status  07/29/2015 0.75 0.50 - 0.99 mg/dL Final   Creatinine, Ser  Date Value Ref Range Status  03/31/2023 0.89 0.57 - 1.00 mg/dL Final   Creatinine, POC  Date Value Ref Range Status  12/09/2017 50 mg/dL Final   Creatinine, Urine  Date Value Ref Range Status  11/16/2012 110.4 mg/dL Final         Passed - eGFR is 30 or above and within 180 days    GFR, Est African American  Date Value Ref Range Status  12/13/2014 71 >=60 mL/min Final   GFR calc Af Amer  Date Value Ref Range Status  09/20/2019 68 >59 mL/min/1.73 Final    Comment:    **[facility] currently reports eGFR in compliance with the current**   recommendations of the [employer]. [facility] will   update reporting as new guidelines are published from the NKF-ASN   Task force.    GFR, Est Non African American  Date Value Ref Range Status  12/13/2014 61 >=60 mL/min Final    Comment:      The estimated GFR is a calculation valid for adults (>=54 years old) that uses the CKD-EPI algorithm to adjust for age and sex. It is   not to be used for children, pregnant women, hospitalized patients,    patients on dialysis, or with rapidly changing  kidney function. According to the NKDEP, eGFR >89 is normal, 60-89 shows mild impairment, 30-59 shows moderate impairment, 15-29 shows severe impairment and <15 is ESRD.      GFR, Estimated  Date Value Ref Range Status  03/31/2023 >60 >60 mL/min Final    Comment:    (NOTE) Calculated using the CKD-EPI Creatinine Equation (2021)    eGFR  Date Value Ref Range Status  03/31/2023 68 >59 mL/min/1.73 Final         Passed - Patient is not pregnant      Passed - Last BP in normal range    BP Readings from Last 1 Encounters:  05/03/23 120/64         Passed - Valid encounter within last 6 months    Recent Outpatient Visits           1 week ago Unintentional weight loss   [place] [facility] [place] - [facility] Dept Of [place]. [facility] Marcine Matar, MD   1 month ago Herpes zoster without complication   [place] [facility] - A Dept Of [place]. [facility] Marcine Matar, MD  10 months ago Type 2 diabetes mellitus with microalbuminuria, with long-term current use of insulin (HCC)   [place] [facility] [place] - A Dept Of [place]. [facility] Jonah Blue B, MD   1 year ago Type 2 diabetes mellitus with microalbuminuria, with long-term current use of insulin [facility])   [place] [facility] - A Dept Of [place]. [facility] Jonah Blue B, MD   1 year ago Type 2 diabetes mellitus without complication, with long-term current use of insulin (HCC)   [place] [facility] - A Dept Of [place]. [facility] [place], Marzella Schlein, [place]       Future Appointments             In 3 months Laural Benes, Binnie Rail, MD [facility] - A Dept Of Eligha Bridegroom. [facility]

## 2023-05-20 NOTE — Progress Notes (Unsigned)
Chief Complaint: Screening for colon cancer, history of colon polyps Primary GI Doctor: Dr. Rhea Belton  Cindy Taylor is a 78 year old female patient with past medical history of arthritis, DM, hypertension, Hepatitis C treated with Harvoni, and hyperlipidemia, who was referred to me by Marcine Matar, MD on 03/31/2023 for screening for colon cancer, history of colon polyps.  On 03/31/23 patient seen in ED for generalized weakness and concerns for dehydration.  UA with leukocytes bacteria and pyuria.  CMP, CBC, and troponins are unremarkable.  Patient given 1 L fluid bolus.  UTI treated with Rocephin.  ECG with normal sinus rhythm.  Patient hemodynamically stable and discharged home.  05/20/2017 colonoscopy with Dr. Rhea Belton, recall 05/2022 Impression:  - One 5 mm polyp in the proximal transverse colon, removed with a cold snare. Resected and retrieved.  - One 2 mm polyp in the sigmoid colon, removed with a cold snare. Resected and retrieved. - Moderate diverticulosis from cecum to sigmoid colon.  - Small internal hemorrhoids. Path: Diagnosis 1. Surgical [P], transverse, polyp - TUBULAR ADENOMA. - NEGATIVE FOR HIGH GRADE DYSPLASIA OR MALIGNANCY. 2. Surgical [P], sigmoid, polyp - HYPERPLASTIC POLYP  Interval History    Patient presents for colon surveillance for history of colonic polyps. Patient denies altered bowel habits, abdominal pain, or rectal bleeding. Patient denies GERD or dysphagia. She recently had bout of Herpes zoster on her back and UTI back in December. She states since then her appetite has decreased and she has lost about 7 lbs. She also has mild nausea, no vomiting. She has small snacks throughout day depending on her appetite. Today she tells me she had a banana and a orange. She does report stress at home with her sons who live with her, one was diagnosed with colon CA with ostomy and her other son had leg amputated. She is still working full time as Programmer, applications. No alcohol.  Dips tobacco. Patient is on daily ASA 81 mg po daily.   Wt Readings from Last 3 Encounters:  05/23/23 125 lb (56.7 kg)  05/03/23 132 lb (59.9 kg)  03/31/23 127 lb (57.6 kg)    Past Medical History:  Diagnosis Date   Arthritis    Combined senile cataract    Diabetes mellitus    mellitus? x 9 years   Hepatitis C    HTN (hypertension)    x8 years   Hyperlipidemia    Hypertensive retinopathy of both eyes    Past Surgical History:  Procedure Laterality Date   TOTAL ABDOMINAL HYSTERECTOMY     TUBAL LIGATION     Current Outpatient Medications  Medication Sig Dispense Refill   aspirin 81 MG tablet Take 1 tablet (81 mg total) by mouth daily. 90 tablet 3   Blood Glucose Monitoring Suppl (ONETOUCH VERIO) w/Device KIT Check blood sugars once daily 1 kit 0   Calcium Citrate-Vitamin D 250-200 MG-UNIT TABS Take 1 tablet by mouth 2 (two) times daily with a meal.     glucose blood (ONETOUCH VERIO) test strip TEST ONCE DAILY. E11.29 100 strip 3   ibuprofen (ADVIL) 200 MG tablet Take 200-400 mg by mouth every 6 (six) hours as needed for mild pain (pain score 1-3) or moderate pain (pain score 4-6).     Insulin Pen Needle (B-D ULTRAFINE III SHORT PEN) 31G X 8 MM MISC USE AS DIRECTED WITH INSULIN PEN 100 each 2   LANTUS SOLOSTAR 100 UNIT/ML Solostar Pen INJECT 10 UNITS UNDER THE SKIN DAILY (Patient taking differently:  10 Units daily.) 15 mL 5   lisinopril-hydrochlorothiazide (ZESTORETIC) 20-12.5 MG tablet Take 1/2 tablet daily PO (Patient taking differently: Take 0.5 tablets by mouth daily.) 45 tablet 1   metFORMIN (GLUCOPHAGE) 1000 MG tablet TAKE 1 TABLET(1000 MG) BY MOUTH in a.m and 1/2 tab in p.m (Patient taking differently: Take 500 mg by mouth daily with breakfast. TAKE 1 TABLET(1000 MG) BY MOUTH in a.m and 1/2 tab in p.m) 135 tablet 1   metoprolol succinate (TOPROL-XL) 50 MG 24 hr tablet TAKE 1 AND 1/2 TABLETS BY MOUTH ONCE DAILY WITH OR IMMEDIATELY FOLLOWING A MEAL 135 tablet 1   OneTouch Delica  Lancets 33G MISC Check blood sugar fasting and before meals and again if pt feels bad (symptoms of hypo). 100 each 12   pravastatin (PRAVACHOL) 40 MG tablet Take 1 tablet (40 mg total) by mouth daily. 90 tablet 1   SUFLAVE 178.7 g SOLR Take 1 kit by mouth once for 1 dose. 1 each 0   No current facility-administered medications for this visit.   Allergies as of 05/23/2023   (No Known Allergies)   Family History  Problem Relation Age of Onset   Stroke Mother    Rectal cancer Mother    Colon polyps Neg Hx    Colon cancer Neg Hx    Esophageal cancer Neg Hx    Stomach cancer Neg Hx    Review of Systems:    Constitutional: No weight loss, fever, chills, weakness or fatigue HEENT: Eyes: No change in vision               Ears, Nose, Throat:  No change in hearing or congestion Skin: No rash or itching Cardiovascular: No chest pain, chest pressure or palpitations   Respiratory: No SOB or cough Gastrointestinal: See HPI and otherwise negative Genitourinary: No dysuria or change in urinary frequency Neurological: No headache, dizziness or syncope Musculoskeletal: No new muscle or joint pain Hematologic: No bleeding or bruising Psychiatric: No history of depression or anxiety    Physical Exam:  Vital signs: BP (!) 98/54   Pulse 65   Ht 5\' 3"  (1.6 m)   Wt 125 lb (56.7 kg)   BMI 22.14 kg/m   Constitutional: Pleasant African American female appears to be in NAD, Well developed, Well nourished, alert and cooperative Throat: Oral cavity and pharynx without inflammation, swelling or lesion.  Respiratory: Respirations even and unlabored. Lungs clear to auscultation bilaterally.   No wheezes, crackles, or rhonchi.  Cardiovascular: Normal S1, S2. Regular rate and rhythm. No peripheral edema, cyanosis or pallor.  Gastrointestinal:  Soft, nondistended, nontender. No rebound or guarding. Normal bowel sounds. No appreciable masses or hepatomegaly. Rectal:  Not performed.  Msk:  Symmetrical  without gross deformities. Without edema, no deformity or joint abnormality.  Neurologic:  Alert and  oriented x4;  grossly normal neurologically.  Skin:   Dry and intact without significant lesions or rashes. Psychiatric: Oriented to person, place and time. Demonstrates good judgement and reason without abnormal affect or behaviors.  RELEVANT LABS AND IMAGING: CBC    Latest Ref Rng & Units 03/31/2023    1:43 PM 03/31/2023   12:39 PM 12/25/2021   11:11 AM  CBC  WBC 3.4 - 10.8 x10E3/uL 5.5  6.1  5.4   Hemoglobin 11.1 - 15.9 g/dL 13.2  44.0  10.2   Hematocrit 34.0 - 46.6 % 45.9  42.6  41.2   Platelets 150 - 450 x10E3/uL 200  218  204  CMP     Latest Ref Rng & Units 03/31/2023    1:43 PM 03/31/2023   12:39 PM 06/21/2022   11:47 AM  CMP  Glucose 70 - 99 mg/dL 97  95  161   BUN 8 - 27 mg/dL 18  17  16    Creatinine 0.57 - 1.00 mg/dL 0.96  0.45  4.09   Sodium 134 - 144 mmol/L 141  136  140   Potassium 3.5 - 5.2 mmol/L 4.7  4.1  4.4   Chloride 96 - 106 mmol/L 101  102  102   CO2 20 - 29 mmol/L 24  26  23    Calcium 8.7 - 10.3 mg/dL 9.8  9.5  9.5   Total Protein 6.0 - 8.5 g/dL 7.5  7.6    Total Bilirubin 0.0 - 1.2 mg/dL 0.3  0.5    Alkaline Phos 44 - 121 IU/L 63  43    AST 0 - 40 IU/L 19  19    ALT 0 - 32 IU/L 11  14       Lab Results  Component Value Date   TSH 1.850 03/31/2023     Assessment: Encounter Diagnoses  Name Primary?   Unintentional weight loss Yes   Poor appetite    History of colonic polyps    Family history of colon cancer    History of hepatitis C     78 year old African [place] female patient that presents for colon surveillance for history of colonic polyps. We will go ahead and schedule colonoscopy. She also has concerns with recent unintentional weight loss over the course of one month. I will go ahead and order upper GI endoscopy. I will also order RUQ abd U/s for [facility] screening for history of hepatitis C.TSH was normal.  We discussed incorporating high  protein shakes BID-TID daily.   Plan: - Recommend protein shakes BID-TID -Order RUQ Abd u/s (HCC screening) -Schedule EGD in [facility] with Dr. Rhea Belton. The risks and benefits of EGD with possible biopsies and esophageal dilation were discussed with the patient who agrees to proceed.  -Schedule a colonoscopy. The risks and benefits of colonoscopy with possible polypectomy / biopsies were discussed and the patient agrees to proceed.    Thank you for the courtesy of this consult. Please call me with any questions or concerns.   Cindy Simao, FNP-C [facility] 05/23/2023, 4:29 PM  Cc: Marcine Matar, MD

## 2023-05-23 ENCOUNTER — Encounter: Payer: Self-pay | Admitting: Gastroenterology

## 2023-05-23 ENCOUNTER — Ambulatory Visit: Payer: Medicare Other | Admitting: Gastroenterology

## 2023-05-23 VITALS — BP 98/54 | HR 65 | Ht 63.0 in | Wt 125.0 lb

## 2023-05-23 DIAGNOSIS — Z8619 Personal history of other infectious and parasitic diseases: Secondary | ICD-10-CM

## 2023-05-23 DIAGNOSIS — Z800 Family history of malignant neoplasm of digestive organs: Secondary | ICD-10-CM

## 2023-05-23 DIAGNOSIS — R630 Anorexia: Secondary | ICD-10-CM | POA: Diagnosis not present

## 2023-05-23 DIAGNOSIS — Z860100 Personal history of colon polyps, unspecified: Secondary | ICD-10-CM

## 2023-05-23 DIAGNOSIS — R634 Abnormal weight loss: Secondary | ICD-10-CM

## 2023-05-23 MED ORDER — SUFLAVE 178.7 G PO SOLR: 1.0000 | Freq: Once | ORAL | 0 refills | Status: AC

## 2023-05-23 NOTE — Patient Instructions (Addendum)
Please purchase the following medications over the counter and take as directed: Boost or Ensure three times a day.  You have been scheduled for an abdominal ultrasound at [facility] (1st floor of hospital) on 05/31/23 at 10:30am. Please arrive 30 minutes prior to your appointment for registration. Make certain not to have anything to eat or drink 6 hours prior to your appointment. Should you need to reschedule your appointment, please contact radiology at [phone]. This test typically takes about 30 minutes to perform.  You have been scheduled for an endoscopy and colonoscopy. Please follow the written instructions given to you at your visit today.  Please pick up your prep supplies at the pharmacy within the next 1-3 days.  If you use inhalers (even only as needed), please bring them with you on the day of your procedure.  DO NOT TAKE 7 DAYS PRIOR TO TEST- Trulicity (dulaglutide) Ozempic, Wegovy (semaglutide) Mounjaro (tirzepatide) Bydureon Bcise (exanatide extended release)  DO NOT TAKE 1 DAY PRIOR TO YOUR TEST Rybelsus (semaglutide) Adlyxin (lixisenatide) Victoza (liraglutide) Byetta (exanatide) _____________________________________________________________________ Bonita Quin will receive your bowel preparation through [facility], which ensures the lowest copay and home delivery, with outreach via text or call from an [phone] number. Please respond promptly to avoid rescheduling. If you are interested in alternative options or have any questions please contact them at [phone]  Your Provider Has Sent Your Bowel Prep Regimen To [facility] What to expect. [facility] will contact you to verify your information and collect your copay, if applicable. Enjoy the comfort of your home while we deliver your prescription to you, free of any shipping charges. Fast, FREE delivery or shipping. [facility] accepts all major insurance benefits and applies discounts & coupons  Have additional  questions? [facility]'s patient care team is always here to help.  Chat: [email or web address] Call: [phone] Email: [email or web address] NCPDP: [id number] How will we contact you? Welcome Phone call  a Welcome text and a Checkout link in a text Texts you receive from [phone] Are Not Spam.   *To set up delivery, you must complete the checkout process via link or speak to one of our patient care representatives. If we are unable to reach you, your prescription may be delayed.  To avoid waiting on hold if you call. Utilize the secure chat feature and request [facility] call you to complete the transaction or expedite your concerns.   If your blood pressure at your visit was 140/90 or greater, please contact your primary care physician to follow up on this.  _______________________________________________________  If you are age 68 or older, your body mass index should be between 23-30. Your Body mass index is 22.14 kg/m. If this is out of the aforementioned range listed, please consider follow up with your Primary Care Provider.  If you are age 7 or younger, your body mass index should be between 19-25. Your Body mass index is 22.14 kg/m. If this is out of the aformentioned range listed, please consider follow up with your Primary Care Provider.   ________________________________________________________  The [facility] providers would like to encourage you to use [facility] to communicate with providers for non-urgent requests or questions.  Due to long hold times on the telephone, sending your provider a message by [facility] may be a faster and more efficient way to get a response.  Please allow 48 business hours for a response.  Please remember that this is for non-urgent requests.  _______________________________________________________

## 2023-05-31 ENCOUNTER — Ambulatory Visit (HOSPITAL_COMMUNITY)
Admission: RE | Admit: 2023-05-31 | Discharge: 2023-05-31 | Disposition: A | Discharge status: Home / Self Care | Payer: Medicare Other | Source: Ambulatory Visit | Attending: Gastroenterology | Admitting: Gastroenterology

## 2023-05-31 DIAGNOSIS — Z8619 Personal history of other infectious and parasitic diseases: Secondary | ICD-10-CM | POA: Insufficient documentation

## 2023-07-01 ENCOUNTER — Other Ambulatory Visit: Payer: Self-pay | Admitting: Internal Medicine

## 2023-07-05 ENCOUNTER — Encounter: Payer: Self-pay | Admitting: Certified Registered Nurse Anesthetist

## 2023-07-12 ENCOUNTER — Encounter: Payer: Self-pay | Admitting: Internal Medicine

## 2023-07-12 ENCOUNTER — Ambulatory Visit (AMBULATORY_SURGERY_CENTER): Payer: Medicare Other | Admitting: Internal Medicine

## 2023-07-12 VITALS — BP 160/86 | HR 81 | Temp 97.5°F | Resp 16 | Ht 63.0 in | Wt 125.0 lb

## 2023-07-12 DIAGNOSIS — K269 Duodenal ulcer, unspecified as acute or chronic, without hemorrhage or perforation: Secondary | ICD-10-CM

## 2023-07-12 DIAGNOSIS — E119 Type 2 diabetes mellitus without complications: Secondary | ICD-10-CM | POA: Diagnosis not present

## 2023-07-12 DIAGNOSIS — Z860101 Personal history of adenomatous and serrated colon polyps: Secondary | ICD-10-CM

## 2023-07-12 DIAGNOSIS — B9681 Helicobacter pylori [H. pylori] as the cause of diseases classified elsewhere: Secondary | ICD-10-CM

## 2023-07-12 DIAGNOSIS — Z1211 Encounter for screening for malignant neoplasm of colon: Secondary | ICD-10-CM

## 2023-07-12 DIAGNOSIS — Z860100 Personal history of colon polyps, unspecified: Secondary | ICD-10-CM

## 2023-07-12 DIAGNOSIS — R634 Abnormal weight loss: Secondary | ICD-10-CM

## 2023-07-12 DIAGNOSIS — I10 Essential (primary) hypertension: Secondary | ICD-10-CM | POA: Diagnosis not present

## 2023-07-12 DIAGNOSIS — K5730 Diverticulosis of large intestine without perforation or abscess without bleeding: Secondary | ICD-10-CM

## 2023-07-12 DIAGNOSIS — K259 Gastric ulcer, unspecified as acute or chronic, without hemorrhage or perforation: Secondary | ICD-10-CM | POA: Diagnosis not present

## 2023-07-12 DIAGNOSIS — K2970 Gastritis, unspecified, without bleeding: Secondary | ICD-10-CM

## 2023-07-12 MED ORDER — SODIUM CHLORIDE 0.9 % IV SOLN: 500.0000 mL | INTRAVENOUS | Status: DC

## 2023-07-12 NOTE — Progress Notes (Signed)
0906 Robinul 0.1 mg IV given due large amount of secretions upon assessment.  MD made aware, vss

## 2023-07-12 NOTE — Op Note (Signed)
 [facility] Patient Name: Cindy Taylor Procedure Date: 07/12/2023 9:05 AM MRN: [id number] Endoscopist: Beverley Fiedler , MD, [id number] Age: 78 Referring MD:  Date of Birth: June 23, 1945 Gender: Female Account #: [id number] Procedure:                Upper GI endoscopy Indications:              Weight loss Medicines:                Monitored Anesthesia Care Procedure:                Pre-Anesthesia Assessment:                           - Prior to the procedure, a History and Physical                            was performed, and patient medications and                            allergies were reviewed. The patient's tolerance of                            previous anesthesia was also reviewed. The risks                            and benefits of the procedure and the sedation                            options and risks were discussed with the patient.                            All questions were answered, and informed consent                            was obtained. Prior Anticoagulants: The patient has                            taken no anticoagulant or antiplatelet agents. ASA                            Grade Assessment: II - A patient with mild systemic                            disease. After reviewing the risks and benefits,                            the patient was deemed in satisfactory condition to                            undergo the procedure.                           After obtaining informed consent, the endoscope was  passed under direct vision. Throughout the                            procedure, the patient's blood pressure, pulse, and                            oxygen saturations were monitored continuously. The                            GIF [id number] #[id number] was introduced through the                            mouth, and advanced to the second part of duodenum.                            The upper GI endoscopy was accomplished  without                            difficulty. The patient tolerated the procedure                            well. Scope In: Scope Out: Findings:                 The examined esophagus was normal.                           A few erosions with no bleeding and no stigmata of                            recent bleeding were found in the gastric antrum.                            Biopsies were taken with a cold forceps for                            Helicobacter pylori testing.                           One non-bleeding superficial duodenal ulcer was                            found in the duodenal bulb. The lesion was 3 mm in                            largest dimension.                           The exam of the duodenum was otherwise normal. Complications:            No immediate complications. Estimated Blood Loss:     Estimated blood loss: none. Impression:               - Normal esophagus.                           -  Gastric erosions with no bleeding and no stigmata                            of recent bleeding. Biopsied.                           - Non-bleeding duodenal ulcer. Recommendation:           - Patient has a contact number available for                            emergencies. The signs and symptoms of potential                            delayed complications were discussed with the                            patient. Return to normal activities tomorrow.                            Written discharge instructions were provided to the                            patient.                           - Resume previous diet.                           - Continue present medications. Avoid NSAIDs.                           - Await pathology results for exclusion of H.                            Pylori.                           - See the other procedure note for documentation of                            additional recommendations. Beverley Fiedler, MD 07/12/2023 9:42:39 AM This  report has been signed electronically.

## 2023-07-12 NOTE — Patient Instructions (Addendum)
 Resume previous diet and medications - avoid NSAIDS.  Education attached on ulcers.  Handout provided on diverticulosis.  No repeat colonoscopy due to age.  Biopsy results will be sent through MyChart.    YOU HAD AN ENDOSCOPIC PROCEDURE TODAY AT THE [facility]:   Refer to the procedure report that was given to you for any specific questions about what was found during the examination.  If the procedure report does not answer your questions, please call your gastroenterologist to clarify.  If you requested that your care partner not be given the details of your procedure findings, then the procedure report has been included in a sealed envelope for you to review at your convenience later.  YOU SHOULD EXPECT: Some feelings of bloating in the abdomen. Passage of more gas than usual.  Walking can help get rid of the air that was put into your GI tract during the procedure and reduce the bloating. If you had a lower endoscopy (such as a colonoscopy or flexible sigmoidoscopy) you may notice spotting of blood in your stool or on the toilet paper. If you underwent a bowel prep for your procedure, you may not have a normal bowel movement for a few days.  Please Note:  You might notice some irritation and congestion in your nose or some drainage.  This is from the oxygen used during your procedure.  There is no need for concern and it should clear up in a day or so.  SYMPTOMS TO REPORT IMMEDIATELY:  Following lower endoscopy (colonoscopy or flexible sigmoidoscopy):  Excessive amounts of blood in the stool  Significant tenderness or worsening of abdominal pains  Swelling of the abdomen that is new, acute  Fever of 100F or higher  Following upper endoscopy (EGD)  Vomiting of blood or coffee ground material  New chest pain or pain under the shoulder blades  Painful or persistently difficult swallowing  New shortness of breath  Fever of 100F or higher  Black, tarry-looking stools  For  urgent or emergent issues, a gastroenterologist can be reached at any hour by calling [phone]. Do not use MyChart messaging for urgent concerns.    DIET:  We do recommend a small meal at first, but then you may proceed to your regular diet.  Drink plenty of fluids but you should avoid alcoholic beverages for 24 hours.  ACTIVITY:  You should plan to take it easy for the rest of today and you should NOT DRIVE or use heavy machinery until tomorrow (because of the sedation medicines used during the test).    FOLLOW UP: Our staff will call the number listed on your records the next business day following your procedure.  We will call around 7:15- 8:00 am to check on you and address any questions or concerns that you may have regarding the information given to you following your procedure. If we do not reach you, we will leave a message.     If any biopsies were taken you will be contacted by phone or by letter within the next 1-3 weeks.  Please call us at [phone] if you have not heard about the biopsies in 3 weeks.    SIGNATURES/CONFIDENTIALITY: You and/or your care partner have signed paperwork which will be entered into your electronic medical record.  These signatures attest to the fact that that the information above on your After Visit Summary has been reviewed and is understood.  Full responsibility of the confidentiality of this discharge information lies  with you and/or your care-partner.

## 2023-07-12 NOTE — Progress Notes (Signed)
 Report given to PACU, vss

## 2023-07-12 NOTE — Progress Notes (Signed)
 Called to room to assist during endoscopic procedure.  Patient ID and intended procedure confirmed with present staff. Received instructions for my participation in the procedure from the performing physician.

## 2023-07-12 NOTE — Progress Notes (Signed)
 Pt's states no medical or surgical changes since previsit or office visit.

## 2023-07-12 NOTE — Op Note (Signed)
 [facility] Patient Name: Cindy Taylor Procedure Date: 07/12/2023 9:04 AM MRN: [id number] Endoscopist: Beverley Fiedler , MD, [id number] Age: 78 Referring MD:  Date of Birth: January 06, 1946 Gender: Female Account #: [id number] Procedure:                Colonoscopy Indications:              High risk colon cancer surveillance: Personal                            history of non-advanced adenoma, Last colonoscopy:                            February 2019 (TA x 1) Medicines:                Monitored Anesthesia Care Procedure:                Pre-Anesthesia Assessment:                           - Prior to the procedure, a History and Physical                            was performed, and patient medications and                            allergies were reviewed. The patient's tolerance of                            previous anesthesia was also reviewed. The risks                            and benefits of the procedure and the sedation                            options and risks were discussed with the patient.                            All questions were answered, and informed consent                            was obtained. Prior Anticoagulants: The patient has                            taken no anticoagulant or antiplatelet agents. ASA                            Grade Assessment: II - A patient with mild systemic                            disease. After reviewing the risks and benefits,                            the patient was deemed in satisfactory condition to  undergo the procedure.                           After obtaining informed consent, the colonoscope                            was passed under direct vision. Throughout the                            procedure, the patient's blood pressure, pulse, and                            oxygen saturations were monitored continuously. The                            Olympus Scope [id number] was introduced  through the                            anus and advanced to the cecum, identified by                            appendiceal orifice and ileocecal valve. The                            colonoscopy was performed without difficulty. The                            patient tolerated the procedure well. The quality                            of the bowel preparation was good. The ileocecal                            valve, appendiceal orifice, and rectum were                            photographed. Scope In: 9:25:39 AM Scope Out: 9:37:29 AM Scope Withdrawal Time: 0 hours 8 minutes 42 seconds  Total Procedure Duration: 0 hours 11 minutes 50 seconds  Findings:                 The digital rectal exam was normal.                           Many large-mouthed, medium-mouthed and                            small-mouthed diverticula were found from cecum to                            sigmoid colon.                           The exam was otherwise without abnormality on  direct and retroflexion views. Complications:            No immediate complications. Estimated Blood Loss:     Estimated blood loss: none. Impression:               - Moderate diverticulosis from cecum to sigmoid                            colon.                           - The examination was otherwise normal on direct                            and retroflexion views.                           - No specimens collected. Recommendation:           - Patient has a contact number available for                            emergencies. The signs and symptoms of potential                            delayed complications were discussed with the                            patient. Return to normal activities tomorrow.                            Written discharge instructions were provided to the                            patient.                           - Resume previous diet.                           -  Continue present medications.                           - No repeat colonoscopy due to age and the absence                            of colonic polyps on today's exam. Beverley Fiedler, MD 07/12/2023 9:44:42 AM This report has been signed electronically.

## 2023-07-12 NOTE — Progress Notes (Signed)
 GASTROENTEROLOGY PROCEDURE H&P NOTE   Primary Care Physician: Marcine Matar, MD    Reason for Procedure:   Loss of weight, hx of polyps  Plan:    EGD/colon  Patient is appropriate for endoscopic procedure(s) in the ambulatory ([facility]) setting.  The nature of the procedure, as well as the risks, benefits, and alternatives were carefully and thoroughly reviewed with the patient. Ample time for discussion and questions allowed. The patient understood, was satisfied, and agreed to proceed.     HPI: Cindy Taylor is a 78 y.o. female who presents for EGD/colon.  Medical history as below.  Tolerated the prep.  No recent chest pain or shortness of breath.  No abdominal pain today.  Past Medical History:  Diagnosis Date   Arthritis    Combined senile cataract    Diabetes mellitus    mellitus? x 9 years   Hepatitis C    HTN (hypertension)    x8 years   Hyperlipidemia    Hypertensive retinopathy of both eyes     Past Surgical History:  Procedure Laterality Date   TOTAL ABDOMINAL HYSTERECTOMY     TUBAL LIGATION      Prior to Admission medications   Medication Sig Start Date End Date Taking? Authorizing Provider  aspirin 81 MG tablet Take 1 tablet (81 mg total) by mouth daily. 10/22/16  Yes Funches, Josalyn, MD  Blood Glucose Monitoring Suppl (ONETOUCH VERIO) w/Device KIT Check blood sugars once daily 07/03/18  Yes Marcine Matar, MD  glucose blood (ONETOUCH VERIO) test strip TEST ONCE DAILY. E11.29 08/06/22  Yes Marcine Matar, MD  Insulin Pen Needle (B-D ULTRAFINE III SHORT PEN) 31G X 8 MM MISC USE AS DIRECTED WITH INSULIN PEN 04/05/22  Yes Marcine Matar, MD  LANTUS SOLOSTAR 100 UNIT/ML Solostar Pen INJECT 10 UNITS UNDER THE SKIN DAILY Patient taking differently: 10 Units daily. 02/04/23  Yes Marcine Matar, MD  metFORMIN (GLUCOPHAGE) 1000 MG tablet TAKE 1 TABLET(1000 MG) BY MOUTH in a.m and 1/2 tab in p.m Patient taking differently: Take 500 mg by  mouth daily with breakfast. TAKE 1 TABLET(1000 MG) BY MOUTH in a.m and 1/2 tab in p.m 03/31/23  Yes Marcine Matar, MD  metoprolol succinate (TOPROL-XL) 50 MG 24 hr tablet TAKE 1 AND 1/2 TABLETS BY MOUTH ONCE DAILY WITH OR IMMEDIATELY FOLLOWING A MEAL 03/31/23  Yes Marcine Matar, MD  OneTouch Delica Lancets 33G MISC Check blood sugar fasting and before meals and again if pt feels bad (symptoms of hypo). 06/21/22  Yes Marcine Matar, MD  pravastatin (PRAVACHOL) 40 MG tablet Take 1 tablet (40 mg total) by mouth daily. 03/31/23  Yes Marcine Matar, MD  Calcium Citrate-Vitamin D 250-200 MG-UNIT TABS Take 1 tablet by mouth 2 (two) times daily with a meal. Patient not taking: Reported on 07/12/2023    [provider]  ibuprofen (ADVIL) 200 MG tablet Take 200-400 mg by mouth every 6 (six) hours as needed for mild pain (pain score 1-3) or moderate pain (pain score 4-6).    [provider]  lisinopril-hydrochlorothiazide (ZESTORETIC) 20-12.5 MG tablet Take 1/2 tablet daily PO Patient not taking: Reported on 07/12/2023 03/31/23   Marcine Matar, MD    Current Outpatient Medications  Medication Sig Dispense Refill   aspirin 81 MG tablet Take 1 tablet (81 mg total) by mouth daily. 90 tablet 3   Blood Glucose Monitoring Suppl (ONETOUCH VERIO) w/Device KIT Check blood sugars once daily 1  kit 0   glucose blood (ONETOUCH VERIO) test strip TEST ONCE DAILY. E11.29 100 strip 3   Insulin Pen Needle (B-D ULTRAFINE III SHORT PEN) 31G X 8 MM MISC USE AS DIRECTED WITH INSULIN PEN 100 each 2   LANTUS SOLOSTAR 100 UNIT/ML Solostar Pen INJECT 10 UNITS UNDER THE SKIN DAILY (Patient taking differently: 10 Units daily.) 15 mL 5   metFORMIN (GLUCOPHAGE) 1000 MG tablet TAKE 1 TABLET(1000 MG) BY MOUTH in a.m and 1/2 tab in p.m (Patient taking differently: Take 500 mg by mouth daily with breakfast. TAKE 1 TABLET(1000 MG) BY MOUTH in a.m and 1/2 tab in p.m) 135 tablet 1   metoprolol succinate  (TOPROL-XL) 50 MG 24 hr tablet TAKE 1 AND 1/2 TABLETS BY MOUTH ONCE DAILY WITH OR IMMEDIATELY FOLLOWING A MEAL 135 tablet 1   OneTouch Delica Lancets 33G MISC Check blood sugar fasting and before meals and again if pt feels bad (symptoms of hypo). 100 each 12   pravastatin (PRAVACHOL) 40 MG tablet Take 1 tablet (40 mg total) by mouth daily. 90 tablet 1   Calcium Citrate-Vitamin D 250-200 MG-UNIT TABS Take 1 tablet by mouth 2 (two) times daily with a meal. (Patient not taking: Reported on 07/12/2023)     ibuprofen (ADVIL) 200 MG tablet Take 200-400 mg by mouth every 6 (six) hours as needed for mild pain (pain score 1-3) or moderate pain (pain score 4-6).     lisinopril-hydrochlorothiazide (ZESTORETIC) 20-12.5 MG tablet Take 1/2 tablet daily PO (Patient not taking: Reported on 07/12/2023) 45 tablet 1   Current Facility-Administered Medications  Medication Dose Route Frequency Provider Last Rate Last Admin   0.9 %  sodium chloride infusion  500 mL Intravenous Continuous Caton Popowski, Carie Caddy, MD        Allergies as of 07/12/2023   (No Known Allergies)    Family History  Problem Relation Age of Onset   Stroke Mother    Rectal cancer Mother    Colon polyps Neg Hx    Colon cancer Neg Hx    Esophageal cancer Neg Hx    Stomach cancer Neg Hx     Social History   Socioeconomic History   Marital status: Single    Spouse name: Not on file   Number of children: Not on file   Years of education: Not on file   Highest education level: Not on file  Occupational History   Not on file  Tobacco Use   Smoking status: Never   Smokeless tobacco: Current    Types: Snuff  Vaping Use   Vaping status: Never Used  Substance and Sexual Activity   Alcohol use: No    Comment: glass of wine at special occasion   Drug use: No   Sexual activity: Not Currently  Other Topics Concern   Not on file  Social History Narrative   Not on file   Social Drivers of Health   Financial Resource Strain: High Risk  (03/31/2023)   Overall Financial Resource Strain (CARDIA)    Difficulty of Paying Living Expenses: Hard  Food Insecurity: Food Insecurity Present (05/03/2023)   Hunger Vital Sign    Worried About Running Out of Food in the Last Year: Sometimes true    Ran Out of Food in the Last Year: Sometimes true  Transportation Needs: No Transportation Needs (03/31/2023)   PRAPARE - Administrator, Civil Service (Medical): No    Lack of Transportation (Non-Medical): No  Physical Activity: Inactive (03/31/2023)  Exercise Vital Sign    Days of Exercise per Week: 0 days    Minutes of Exercise per Session: 0 min  Stress: No Stress Concern Present (03/31/2023)   [employer] of Occupational Health - Occupational Stress Questionnaire    Feeling of Stress : Not at all  Social Connections: Moderately Integrated (03/31/2023)   Social Connection and Isolation Panel [NHANES]    Frequency of Communication with Friends and Family: More than three times a week    Frequency of Social Gatherings with Friends and Family: Once a week    Attends Religious Services: More than 4 times per year    Active Member of [employer] or Organizations: Yes    Attends Banker Meetings: Never    Marital Status: Widowed  Intimate Partner Violence: Not At Risk (03/31/2023)   Humiliation, Afraid, Rape, and Kick questionnaire    Fear of Current or Ex-Partner: No    Emotionally Abused: No    Physically Abused: No    Sexually Abused: No    Physical Exam: Vital signs in last 24 hours: @BP  (!) 182/88   Pulse 71   Temp (!) 97.5 F (36.4 C)   Resp 16   Ht 5\' 3"  (1.6 m)   Wt 125 lb (56.7 kg)   SpO2 98%   BMI 22.14 kg/m  GEN: NAD EYE: Sclerae anicteric ENT: MMM CV: Non-tachycardic Pulm: CTA b/l GI: Soft, NT/ND NEURO:  Alert & Oriented x 3   Erick Blinks, MD [facility]  07/12/2023 9:11 AM

## 2023-07-13 ENCOUNTER — Telehealth: Payer: Self-pay

## 2023-07-13 NOTE — Telephone Encounter (Signed)
  Follow up Call-     07/12/2023    8:05 AM  Call back number  Post procedure Call Back phone  # [phone],  Permission to leave phone message Yes     Patient questions:  Do you have a fever, pain , or abdominal swelling? No. Pain Score  0 *  Have you tolerated food without any problems? Yes.    Have you been able to return to your normal activities? Yes.    Do you have any questions about your discharge instructions: Diet   No. Medications  No. Follow up visit  No.  Do you have questions or concerns about your Care? No.  Actions: * If pain score is 4 or above: No action needed, pain <4.

## 2023-07-14 LAB — SURGICAL PATHOLOGY

## 2023-07-15 ENCOUNTER — Other Ambulatory Visit: Payer: Self-pay

## 2023-07-15 MED ORDER — OMEPRAZOLE 20 MG PO CPDR: 20.0000 mg | DELAYED_RELEASE_CAPSULE | Freq: Two times a day (BID) | ORAL | 0 refills | Status: AC

## 2023-07-15 MED ORDER — METRONIDAZOLE 250 MG PO TABS: 250.0000 mg | ORAL_TABLET | Freq: Four times a day (QID) | ORAL | 0 refills | Status: DC

## 2023-07-15 MED ORDER — DOXYCYCLINE HYCLATE 100 MG PO TABS: 100.0000 mg | ORAL_TABLET | Freq: Two times a day (BID) | ORAL | 0 refills | Status: DC

## 2023-07-15 MED ORDER — BISMUTH 262 MG PO CHEW: 524.0000 mg | CHEWABLE_TABLET | Freq: Four times a day (QID) | ORAL | 0 refills | Status: AC

## 2023-07-26 ENCOUNTER — Other Ambulatory Visit: Payer: Self-pay | Admitting: Podiatry

## 2023-07-26 ENCOUNTER — Other Ambulatory Visit: Payer: Self-pay | Admitting: Internal Medicine

## 2023-07-27 ENCOUNTER — Telehealth: Payer: Self-pay | Admitting: Internal Medicine

## 2023-07-27 NOTE — Telephone Encounter (Signed)
 Inbound call from patient stating [facility] has not received medication that were called in after 3/25 procedure. Requesting a call back. Please advise, thank you.

## 2023-07-27 NOTE — Telephone Encounter (Signed)
 Patient called [facility] back and was told that the prescription was put back in stock as patient did not come get the medication in the allotted time frame. They indicate that will re-fill and have it ready for pick up today. Patient says she does not need anything further at this time.

## 2023-08-01 ENCOUNTER — Emergency Department (HOSPITAL_COMMUNITY)

## 2023-08-01 ENCOUNTER — Emergency Department (HOSPITAL_COMMUNITY)
Admission: EM | Admit: 2023-08-01 | Discharge: 2023-08-02 | Disposition: A | Discharge status: Home / Self Care | Attending: Emergency Medicine | Admitting: Emergency Medicine

## 2023-08-01 ENCOUNTER — Encounter (HOSPITAL_COMMUNITY): Payer: Self-pay | Admitting: Emergency Medicine

## 2023-08-01 ENCOUNTER — Other Ambulatory Visit: Payer: Self-pay

## 2023-08-01 DIAGNOSIS — E119 Type 2 diabetes mellitus without complications: Secondary | ICD-10-CM | POA: Diagnosis not present

## 2023-08-01 DIAGNOSIS — S0990XA Unspecified injury of head, initial encounter: Secondary | ICD-10-CM | POA: Diagnosis not present

## 2023-08-01 DIAGNOSIS — R109 Unspecified abdominal pain: Secondary | ICD-10-CM | POA: Insufficient documentation

## 2023-08-01 DIAGNOSIS — Z794 Long term (current) use of insulin: Secondary | ICD-10-CM | POA: Diagnosis not present

## 2023-08-01 DIAGNOSIS — Z7982 Long term (current) use of aspirin: Secondary | ICD-10-CM | POA: Diagnosis not present

## 2023-08-01 DIAGNOSIS — I10 Essential (primary) hypertension: Secondary | ICD-10-CM | POA: Diagnosis not present

## 2023-08-01 DIAGNOSIS — R0789 Other chest pain: Secondary | ICD-10-CM | POA: Diagnosis not present

## 2023-08-01 DIAGNOSIS — N281 Cyst of kidney, acquired: Secondary | ICD-10-CM | POA: Diagnosis not present

## 2023-08-01 DIAGNOSIS — S20212A Contusion of left front wall of thorax, initial encounter: Secondary | ICD-10-CM | POA: Diagnosis not present

## 2023-08-01 DIAGNOSIS — R918 Other nonspecific abnormal finding of lung field: Secondary | ICD-10-CM | POA: Diagnosis not present

## 2023-08-01 DIAGNOSIS — Z7984 Long term (current) use of oral hypoglycemic drugs: Secondary | ICD-10-CM | POA: Diagnosis not present

## 2023-08-01 DIAGNOSIS — Y92410 Unspecified street and highway as the place of occurrence of the external cause: Secondary | ICD-10-CM | POA: Diagnosis not present

## 2023-08-01 DIAGNOSIS — R1084 Generalized abdominal pain: Secondary | ICD-10-CM | POA: Diagnosis not present

## 2023-08-01 DIAGNOSIS — R079 Chest pain, unspecified: Secondary | ICD-10-CM | POA: Diagnosis not present

## 2023-08-01 DIAGNOSIS — Z79899 Other long term (current) drug therapy: Secondary | ICD-10-CM | POA: Insufficient documentation

## 2023-08-01 DIAGNOSIS — K5730 Diverticulosis of large intestine without perforation or abscess without bleeding: Secondary | ICD-10-CM | POA: Diagnosis not present

## 2023-08-01 DIAGNOSIS — S3991XA Unspecified injury of abdomen, initial encounter: Secondary | ICD-10-CM | POA: Diagnosis not present

## 2023-08-01 DIAGNOSIS — R519 Headache, unspecified: Secondary | ICD-10-CM | POA: Diagnosis not present

## 2023-08-01 DIAGNOSIS — J439 Emphysema, unspecified: Secondary | ICD-10-CM | POA: Diagnosis not present

## 2023-08-01 DIAGNOSIS — R9089 Other abnormal findings on diagnostic imaging of central nervous system: Secondary | ICD-10-CM | POA: Diagnosis not present

## 2023-08-01 DIAGNOSIS — S299XXA Unspecified injury of thorax, initial encounter: Secondary | ICD-10-CM | POA: Diagnosis not present

## 2023-08-01 DIAGNOSIS — S199XXA Unspecified injury of neck, initial encounter: Secondary | ICD-10-CM | POA: Diagnosis not present
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
## 2023-08-02 ENCOUNTER — Emergency Department (HOSPITAL_COMMUNITY)

## 2023-08-02 DIAGNOSIS — R9089 Other abnormal findings on diagnostic imaging of central nervous system: Secondary | ICD-10-CM | POA: Diagnosis not present

## 2023-08-02 DIAGNOSIS — S0990XA Unspecified injury of head, initial encounter: Secondary | ICD-10-CM | POA: Diagnosis not present

## 2023-08-02 DIAGNOSIS — K5730 Diverticulosis of large intestine without perforation or abscess without bleeding: Secondary | ICD-10-CM | POA: Diagnosis not present

## 2023-08-02 DIAGNOSIS — S3991XA Unspecified injury of abdomen, initial encounter: Secondary | ICD-10-CM | POA: Diagnosis not present

## 2023-08-02 DIAGNOSIS — S199XXA Unspecified injury of neck, initial encounter: Secondary | ICD-10-CM | POA: Diagnosis not present

## 2023-08-02 DIAGNOSIS — N281 Cyst of kidney, acquired: Secondary | ICD-10-CM | POA: Diagnosis not present

## 2023-08-02 LAB — COMPREHENSIVE METABOLIC PANEL WITH GFR
ALT: 17 U/L (ref 0–44)
AST: 23 U/L (ref 15–41)
Albumin: 3.8 g/dL (ref 3.5–5.0)
Alkaline Phosphatase: 47 U/L (ref 38–126)
Anion gap: 10 (ref 5–15)
BUN: 14 mg/dL (ref 8–23)
CO2: 26 mmol/L (ref 22–32)
Calcium: 9.9 mg/dL (ref 8.9–10.3)
Chloride: 104 mmol/L (ref 98–111)
Creatinine, Ser: 0.83 mg/dL (ref 0.44–1.00)
GFR, Estimated: >60 mL/min (ref >60)
Glucose, Bld: 96 mg/dL (ref 70–99)
Potassium: 4.1 mmol/L (ref 3.5–5.1)
Sodium: 140 mmol/L (ref 135–145)
Total Bilirubin: 0.9 mg/dL (ref 0.0–1.2)
Total Protein: 7.8 g/dL (ref 6.5–8.1)

## 2023-08-02 LAB — CBC WITH DIFFERENTIAL/PLATELET
Abs Immature Granulocytes: 0.01 10*3/uL (ref 0.00–0.07)
Basophils Absolute: 0.1 10*3/uL (ref 0.0–0.1)
Basophils Relative: 1 %
Eosinophils Absolute: 0 10*3/uL (ref 0.0–0.5)
Eosinophils Relative: 0 %
HCT: 39.6 % (ref 36.0–46.0)
Hemoglobin: 12.9 g/dL (ref 12.0–15.0)
Immature Granulocytes: 0 %
Lymphocytes Relative: 49 %
Lymphs Abs: 3 10*3/uL (ref 0.7–4.0)
MCH: 26.2 pg (ref 26.0–34.0)
MCHC: 32.6 g/dL (ref 30.0–36.0)
MCV: 80.5 fL (ref 80.0–100.0)
Monocytes Absolute: 0.6 10*3/uL (ref 0.1–1.0)
Monocytes Relative: 9 %
Neutro Abs: 2.5 10*3/uL (ref 1.7–7.7)
Neutrophils Relative %: 41 %
Platelets: 228 10*3/uL (ref 150–400)
RBC: 4.92 MIL/uL (ref 3.87–5.11)
RDW: 14.9 % (ref 11.5–15.5)
WBC: 6.2 10*3/uL (ref 4.0–10.5)
nRBC: 0 % (ref 0.0–0.2)

## 2023-08-02 LAB — TROPONIN I (HIGH SENSITIVITY)
Troponin I (High Sensitivity): 40 ng/L — ABNORMAL HIGH (ref <18)
Troponin I (High Sensitivity): 17 ng/L (ref <18)

## 2023-08-02 MED ORDER — IOHEXOL 350 MG/ML SOLN
60.0000 mL | Freq: Once | INTRAVENOUS | Status: AC | PRN
  Administered 2023-08-02: 60 mL via INTRAVENOUS

## 2023-08-02 MED ORDER — ACETAMINOPHEN 325 MG PO TABS
650.0000 mg | ORAL_TABLET | Freq: Once | ORAL | Status: AC
  Administered 2023-08-02: 650 mg via ORAL
  Filled 2023-08-02: qty 2

## 2023-08-02 NOTE — ED Notes (Signed)
 Lab called to stick for repeat trop
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
## 2023-08-02 NOTE — ED Notes (Signed)
 Patient transported to CT

## 2023-08-02 NOTE — Progress Notes (Signed)
 Provided emotional  and spiritual support to pt. Pt alert and talking on phone. Chaplain available as needed.  Anton Baton, [place], [facility], Pager  [phone]

## 2023-08-02 NOTE — ED Provider Notes (Signed)
 Patient care signed out to follow-up CT scan results of abdomen pelvis.  Results independently reviewed after calling radiology due to delay in patient needing repeat IV placed.  No evidence of significant internal organ injury.  Patient has no signs or symptoms on recheck.  Patient well-appearing and comfortable with outpatient follow-up.  Reasons to return discussed.   Filiberto Hug, MD 08/02/23 1344

## 2023-08-02 NOTE — Discharge Instructions (Addendum)
 Use Tylenol every 4 hours as needed for pain in addition to ice as needed.  Follow-up with your doctor and return for new concerns.
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
## 2023-08-03 ENCOUNTER — Other Ambulatory Visit: Payer: Self-pay | Admitting: Internal Medicine

## 2023-08-08 ENCOUNTER — Telehealth: Payer: Self-pay

## 2023-08-08 ENCOUNTER — Telehealth: Payer: Self-pay | Admitting: *Deleted

## 2023-08-08 DIAGNOSIS — I10 Essential (primary) hypertension: Secondary | ICD-10-CM

## 2023-08-08 NOTE — Progress Notes (Unsigned)
 Complex Care Management Note Care Guide Note  08/08/2023 Name: Brayah Urquilla MRN: [id number] DOB: [birth year]   Complex Care Management Outreach Attempts: An unsuccessful telephone outreach was attempted today to offer the patient information about available complex care management services.  Follow Up Plan:  Additional outreach attempts will be made to offer the patient complex care management information and services.   Encounter Outcome:  No Answer  Barnie Bora  [facility]  [facility], [facility] Guide  Direct Dial: [phone]  Fax [phone]

## 2023-08-09 NOTE — Progress Notes (Signed)
 Complex Care Management Note  Care Guide Note 08/09/2023 Name: Cindy Taylor MRN: [id number] DOB: 17-Nov-1945  Cindy Taylor is a 78 y.o. year old female who sees Lawrance Presume, MD for primary care. I reached out to Yalena Moore Uselton by phone today to offer complex care management services.  [name]   The Complex Care Management services include support from the care team which includes your Nurse Care Manager, Clinical Social Worker, or Pharmacist.  The Complex Care Management team is here to help remove barriers to the health concerns and goals most important to you. Complex Care Management services are voluntary, and the patient may decline or stop services at any time by request to their care team member.   Complex Care Management Consent Status: Patient agreed to services and verbal consent obtained.  Patient was not able to complete appointment sooner as she is working.   Follow up plan:  Telephone appointment with complex care management team member scheduled for:  5/1  Encounter Outcome:  Patient Scheduled  Barnie Bora  [facility]  [facility], [facility] Guide  Direct Dial: [phone]  Fax [phone]

## 2023-08-17 ENCOUNTER — Inpatient Hospital Stay: Admitting: Physician Assistant

## 2023-08-18 ENCOUNTER — Other Ambulatory Visit: Payer: Self-pay | Admitting: Licensed Clinical Social Worker
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
## 2023-08-24 ENCOUNTER — Ambulatory Visit: Attending: Physician Assistant | Admitting: Physician Assistant

## 2023-08-24 VITALS — BP 134/69 | HR 75 | Temp 98.1°F | Ht 63.0 in | Wt 130.0 lb

## 2023-08-24 DIAGNOSIS — R809 Proteinuria, unspecified: Secondary | ICD-10-CM

## 2023-08-24 DIAGNOSIS — Z794 Long term (current) use of insulin: Secondary | ICD-10-CM | POA: Diagnosis not present

## 2023-08-24 DIAGNOSIS — R634 Abnormal weight loss: Secondary | ICD-10-CM

## 2023-08-24 DIAGNOSIS — I8390 Asymptomatic varicose veins of unspecified lower extremity: Secondary | ICD-10-CM

## 2023-08-24 DIAGNOSIS — R9389 Abnormal findings on diagnostic imaging of other specified body structures: Secondary | ICD-10-CM

## 2023-08-24 DIAGNOSIS — E119 Type 2 diabetes mellitus without complications: Secondary | ICD-10-CM | POA: Diagnosis not present

## 2023-08-24 DIAGNOSIS — E1129 Type 2 diabetes mellitus with other diabetic kidney complication: Secondary | ICD-10-CM

## 2023-08-24 DIAGNOSIS — Z09 Encounter for follow-up examination after completed treatment for conditions other than malignant neoplasm: Secondary | ICD-10-CM

## 2023-08-24 LAB — GLUCOSE, POCT (MANUAL RESULT ENTRY): POC Glucose: 134 mg/dL — AB (ref 70–99)
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
## 2023-08-28 ENCOUNTER — Other Ambulatory Visit: Payer: Self-pay | Admitting: Internal Medicine

## 2023-09-01 ENCOUNTER — Ambulatory Visit: Payer: Medicare Other | Attending: Internal Medicine | Admitting: Internal Medicine

## 2023-09-01 ENCOUNTER — Encounter: Payer: Self-pay | Admitting: Internal Medicine

## 2023-09-01 VITALS — BP 107/69 | HR 70 | Temp 98.2°F | Ht 63.0 in | Wt 127.0 lb

## 2023-09-01 DIAGNOSIS — R9389 Abnormal findings on diagnostic imaging of other specified body structures: Secondary | ICD-10-CM | POA: Diagnosis not present

## 2023-09-01 DIAGNOSIS — R0789 Other chest pain: Secondary | ICD-10-CM

## 2023-09-01 DIAGNOSIS — I152 Hypertension secondary to endocrine disorders: Secondary | ICD-10-CM

## 2023-09-01 DIAGNOSIS — Z794 Long term (current) use of insulin: Secondary | ICD-10-CM | POA: Diagnosis not present

## 2023-09-01 DIAGNOSIS — Z7984 Long term (current) use of oral hypoglycemic drugs: Secondary | ICD-10-CM | POA: Diagnosis not present

## 2023-09-01 DIAGNOSIS — E119 Type 2 diabetes mellitus without complications: Secondary | ICD-10-CM

## 2023-09-01 DIAGNOSIS — K269 Duodenal ulcer, unspecified as acute or chronic, without hemorrhage or perforation: Secondary | ICD-10-CM

## 2023-09-01 DIAGNOSIS — R6889 Other general symptoms and signs: Secondary | ICD-10-CM

## 2023-09-01 DIAGNOSIS — E785 Hyperlipidemia, unspecified: Secondary | ICD-10-CM | POA: Diagnosis not present

## 2023-09-01 DIAGNOSIS — E1169 Type 2 diabetes mellitus with other specified complication: Secondary | ICD-10-CM

## 2023-09-01 DIAGNOSIS — I10 Essential (primary) hypertension: Secondary | ICD-10-CM | POA: Diagnosis not present

## 2023-09-01 LAB — POCT GLYCOSYLATED HEMOGLOBIN (HGB A1C): HbA1c, POC (controlled diabetic range): 6 % (ref 0.0–7.0)

## 2023-09-01 LAB — GLUCOSE, POCT (MANUAL RESULT ENTRY): POC Glucose: 113 mg/dL — AB (ref 70–99)
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
## 2023-09-23 ENCOUNTER — Other Ambulatory Visit: Payer: Self-pay | Admitting: Internal Medicine

## 2023-09-23 ENCOUNTER — Other Ambulatory Visit: Payer: Self-pay

## 2023-09-23 DIAGNOSIS — A048 Other specified bacterial intestinal infections: Secondary | ICD-10-CM

## 2023-09-23 DIAGNOSIS — E1165 Type 2 diabetes mellitus with hyperglycemia: Secondary | ICD-10-CM

## 2023-09-23 DIAGNOSIS — Z794 Long term (current) use of insulin: Secondary | ICD-10-CM

## 2023-09-23 NOTE — Progress Notes (Signed)
 Cindy Taylor

## 2023-09-26 ENCOUNTER — Other Ambulatory Visit

## 2023-09-27 ENCOUNTER — Ambulatory Visit
Admission: RE | Admit: 2023-09-27 | Discharge: 2023-09-27 | Disposition: A | Discharge status: Home / Self Care | Source: Ambulatory Visit | Attending: Physician Assistant | Admitting: Physician Assistant

## 2023-09-27 DIAGNOSIS — R634 Abnormal weight loss: Secondary | ICD-10-CM

## 2023-09-27 DIAGNOSIS — R9389 Abnormal findings on diagnostic imaging of other specified body structures: Secondary | ICD-10-CM

## 2023-09-29 ENCOUNTER — Other Ambulatory Visit: Payer: Self-pay | Admitting: Internal Medicine

## 2023-09-29 DIAGNOSIS — E1169 Type 2 diabetes mellitus with other specified complication: Secondary | ICD-10-CM

## 2023-10-05 ENCOUNTER — Ambulatory Visit: Payer: Self-pay | Admitting: Internal Medicine

## 2023-10-05 NOTE — Telephone Encounter (Signed)
-----   Message from Dulce Gibbs sent at 10/03/2023 11:49 AM EDT ----- Clive Dancer you mind making the follow up recommendations for this patient of yours I saw?  Thanks, Dulce Gibbs, PA-C ----- Message ----- From: Interface, Rad Results In Sent: 10/03/2023  12:13 AM EDT To: Hassie Lint, PA-C

## 2023-10-05 NOTE — Telephone Encounter (Signed)
 Phone call placed to patient this morning to go over the results of recent CAT scan of the chest.  I left a voicemail message informing of who I am and that I was calling to go over imaging results. Please let her know that the CAT scan of her chest showed that the changes seen in the left lung on previous imaging study have just about resolved.  Showed 2 small spots/nodule in the right lung that are stable in size.  We will plan to repeat CAT scan in 1 year to make sure they remain stable. Also showed mild enlargement of the heart.  I would like to order a study on the heart called an echocardiogram to evaluate further.  Please let me know if she agrees to proceed then I can place order.

## 2023-10-06 ENCOUNTER — Telehealth: Payer: Self-pay | Admitting: Internal Medicine

## 2023-10-06 NOTE — Telephone Encounter (Signed)
 Copied from CRM [id number]. Topic: General - Other >> Oct 06, 2023 12:58 PM Cindy Taylor wrote:  Reason for CRM: Pls call patient she is okay w/study on the heart -echocardiogram to evaluate .Cindy Taylor Also never heard about her endoscopy.. she finished her 30 day medicine and wants to know if she is okay.. this was from March

## 2023-10-06 NOTE — Telephone Encounter (Signed)
 Duplicate, Please see recent encounter. ?

## 2023-10-06 NOTE — Telephone Encounter (Signed)
 Copied from CRM [id number]. Topic: General - Other >> Oct 06, 2023 12:58 PM Emylou G wrote:  Reason for CRM: Pls call patient she is okay w/study on the heart -echocardiogram to evaluate .Aaron Aas Also never heard about her endoscopy.. she finished her 30 day medicine and wants to know if she is okay.. this was from March

## 2023-10-07 ENCOUNTER — Other Ambulatory Visit: Payer: Self-pay | Admitting: Internal Medicine

## 2023-10-07 DIAGNOSIS — I517 Cardiomegaly: Secondary | ICD-10-CM

## 2023-10-07 NOTE — Telephone Encounter (Signed)
 Noted. Please see labs for original message

## 2023-10-08 ENCOUNTER — Telehealth: Payer: Self-pay | Admitting: Internal Medicine

## 2023-10-08 NOTE — Telephone Encounter (Signed)
 Please call and schedule Echo for her.  Let her know the gastroenterologist who performed her endoscopy and colonoscopy has ordered stool study on her to check to see if the infection in her stomach has cleared.  She can call his office for info on how to collect the stool study.

## 2023-10-19 ENCOUNTER — Other Ambulatory Visit: Payer: Self-pay | Admitting: *Deleted

## 2023-10-19 DIAGNOSIS — I8393 Asymptomatic varicose veins of bilateral lower extremities: Secondary | ICD-10-CM

## 2023-10-19 NOTE — Telephone Encounter (Signed)
 Called & spoke to Cindy P. at [facility] She stated that a pre-determination is required due to diagnosis code. Request started, awaiting for case manager to be assigned for further instruction if documentation is needed.  Pre-determination: [phone] Call ref #: [id number] Phone #: [phone] C.A.

## 2023-10-20 ENCOUNTER — Encounter: Payer: Self-pay | Admitting: Internal Medicine

## 2023-10-20 ENCOUNTER — Other Ambulatory Visit: Payer: Self-pay | Admitting: Internal Medicine

## 2023-10-24 NOTE — Telephone Encounter (Signed)
 Called & spoke to Cindy Taylor at [facility].  Pre-determination is still pending. Turn around time is up to 15 days.  Call ref #: [id number] C.A.

## 2023-10-28 NOTE — Telephone Encounter (Signed)
 Called & spoke to Cindy Taylor, from [facility].  No prior authorization needed for CPT codes 06029 & [phone] Call reference #: [id number]  Prior auth approved for CPT code 06693 Pre-determination case #: [id number] Call ref #: [id number] Phone #: [phone] C.A.

## 2023-10-31 NOTE — Progress Notes (Unsigned)
 [facility] [place]  ASSESSMENT / PLAN: 78 y.o. female with prominent superficial veins of the calves bilaterally.  Given the absence of reflux on duplex exam, I counseled the patient that any kind of intervention for these would not likely be supported by insurance.  I counseled her to do compression and elevation.  She can follow-up with me as needed.  CHIEF COMPLAINT: Prominent veins in legs  HISTORY OF PRESENT ILLNESS: Cindy Taylor is a 78 y.o. female referred to clinic for evaluation of possible chronic venous insufficiency.  The patient reports bulging veins about her anterior calves bilaterally.  These do not terribly bothersome to her.  She does report some mild swelling in the ankle.  We reviewed her duplex in detail.  I counseled her about the benign nature of these findings.  Past Medical History:  Diagnosis Date   Arthritis    Combined senile cataract    Diabetes mellitus    mellitus? x 9 years   Hepatitis C    HTN (hypertension)    x8 years   Hyperlipidemia    Hypertensive retinopathy of both eyes     Past Surgical History:  Procedure Laterality Date   TOTAL ABDOMINAL HYSTERECTOMY     TUBAL LIGATION      Family History  Problem Relation Age of Onset   Stroke Mother    Rectal cancer Mother    Colon polyps Neg Hx    Colon cancer Neg Hx    Esophageal cancer Neg Hx    Stomach cancer Neg Hx     Social History   Socioeconomic History   Marital status: Single    Spouse name: Not on file   Number of children: Not on file   Years of education: Not on file   Highest education level: Not on file  Occupational History   Not on file  Tobacco Use   Smoking status: Never   Smokeless tobacco: Current    Types: Snuff  Vaping Use   Vaping status: Never Used  Substance and Sexual Activity   Alcohol use: No    Comment: glass of wine at special occasion   Drug use: No   Sexual activity: Not Currently  Other Topics Concern   Not  on file  Social History Narrative   Not on file   Social Drivers of Health   Financial Resource Strain: High Risk (03/31/2023)   Overall Financial Resource Strain (CARDIA)    Difficulty of Paying Living Expenses: Hard  Food Insecurity: Food Insecurity Present (08/18/2023)   Hunger Vital Sign    Worried About Running Out of Food in the Last Year: Sometimes true    Ran Out of Food in the Last Year: Sometimes true  Transportation Needs: No Transportation Needs (08/18/2023)   PRAPARE - Administrator, Civil Service (Medical): No    Lack of Transportation (Non-Medical): No  Physical Activity: Inactive (03/31/2023)   Exercise Vital Sign    Days of Exercise per Week: 0 days    Minutes of Exercise per Session: 0 min  Stress: No Stress Concern Present (03/31/2023)   [employer] of Occupational Health - Occupational Stress Questionnaire    Feeling of Stress : Not at all  Social Connections: Moderately Integrated (03/31/2023)   Social Connection and Isolation Panel    Frequency of Communication with Friends and Family: More than three times a week    Frequency of Social Gatherings with Friends and Family: Once a week  Attends Religious Services: More than 4 times per year    Active Member of Clubs or Organizations: Yes    Attends Banker Meetings: Never    Marital Status: Widowed  Intimate Partner Violence: Not At Risk (08/18/2023)   Humiliation, Afraid, Rape, and Kick questionnaire    Fear of Current or Ex-Partner: No    Emotionally Abused: No    Physically Abused: No    Sexually Abused: No    No Known Allergies  Current Outpatient Medications  Medication Sig Dispense Refill   aspirin  81 MG tablet Take 1 tablet (81 mg total) by mouth daily. 90 tablet 3   BD PEN NEEDLE SHORT ULTRAFINE 31G X 8 MM MISC USE AS DIRECTED WITH INSULIN  PEN 100 each 2   Bismuth  262 MG CHEW Chew 524 mg by mouth in the morning, at noon, in the evening, and at bedtime. (Patient not  taking: Reported on 09/01/2023) 112 tablet 0   Blood Glucose Monitoring Suppl (ONETOUCH VERIO) w/Device KIT Check blood sugars once daily 1 kit 0   Calcium  Citrate-Vitamin D 250-200 MG-UNIT TABS Take 1 tablet by mouth 2 (two) times daily with a meal.     ibuprofen  (ADVIL ) 200 MG tablet Take 200-400 mg by mouth every 6 (six) hours as needed for mild pain (pain score 1-3) or moderate pain (pain score 4-6). (Patient not taking: Reported on 08/24/2023)     LANTUS  SOLOSTAR 100 UNIT/ML Solostar Pen INJECT 10 UNITS UNDER THE SKIN DAILY (Patient taking differently: 10 Units daily.) 15 mL 5   lisinopril -hydrochlorothiazide  (ZESTORETIC ) 20-12.5 MG tablet TAKE 1/2 TABLET BY MOUTH DAILY 45 tablet 1   metFORMIN  (GLUCOPHAGE ) 1000 MG tablet TAKE 1 TABLET(1000 MG) BY MOUTH in a.m and 1/2 tab in p.m (Patient taking differently: Take 500 mg by mouth daily with breakfast. TAKE 1 TABLET(1000 MG) BY MOUTH in a.m and 1/2 tab in p.m) 135 tablet 1   metoprolol  succinate (TOPROL -XL) 50 MG 24 hr tablet TAKE 1 AND 1/2 TABLETS BY MOUTH DAILY WITH OR IMMEDIATELY FOLLOWING A MEAL 135 tablet 0   omeprazole  (PRILOSEC) 20 MG capsule Take 1 capsule (20 mg total) by mouth 2 (two) times daily before a meal. (Patient not taking: Reported on 09/01/2023) 28 capsule 0   OneTouch Delica Lancets 33G MISC Check blood sugar fasting and before meals and again if pt feels bad (symptoms of hypo). 100 each 12   ONETOUCH VERIO test strip TEST ONCE DAILY 100 strip 3   pravastatin  (PRAVACHOL ) 40 MG tablet TAKE 1 TABLET(40 MG) BY MOUTH DAILY 90 tablet 1   No current facility-administered medications for this visit.    PHYSICAL EXAM Vitals:   11/01/23 1012  BP: (!) 151/80  Pulse: 60  Temp: 97.9 F (36.6 C)  SpO2: 96%  Weight: 131 lb (59.4 kg)  Height: 5' 3 (1.6 m)    Elderly woman in no distress on regular rate and rhythm Unlabored breathing Palpable radial pulses bilaterally Palpable dorsalis pedis pulses bilaterally Mild prominence of  superficial veins of the calves bilaterally  PERTINENT LABORATORY AND RADIOLOGIC DATA  Most recent CBC    Latest Ref Rng & Units 08/02/2023    6:44 AM 03/31/2023    1:43 PM 03/31/2023   12:39 PM  CBC  WBC 4.0 - 10.5 K/uL 6.2  5.5  6.1   Hemoglobin 12.0 - 15.0 g/dL 87.0  85.8  86.0   Hematocrit 36.0 - 46.0 % 39.6  45.9  42.6   Platelets 150 - 400  K/uL 228  200  218      Most recent CMP    Latest Ref Rng & Units 08/02/2023    6:44 AM 03/31/2023    1:43 PM 03/31/2023   12:39 PM  CMP  Glucose 70 - 99 mg/dL 96  97  95   BUN 8 - 23 mg/dL 14  18  17    Creatinine 0.44 - 1.00 mg/dL 9.16  9.10  9.18   Sodium 135 - 145 mmol/L 140  141  136   Potassium 3.5 - 5.1 mmol/L 4.1  4.7  4.1   Chloride 98 - 111 mmol/L 104  101  102   CO2 22 - 32 mmol/L 26  24  26    Calcium  8.9 - 10.3 mg/dL 9.9  9.8  9.5   Total Protein 6.5 - 8.1 g/dL 7.8  7.5  7.6   Total Bilirubin 0.0 - 1.2 mg/dL 0.9  0.3  0.5   Alkaline Phos 38 - 126 U/L 47  63  43   AST 15 - 41 U/L 23  19  19    ALT 0 - 44 U/L 17  11  14      Renal function CrCl cannot be calculated (Patient's most recent lab result is older than the maximum 21 days allowed.).  HbA1c, POC (prediabetic range) (%)  Date Value  12/09/2017 6.2   HbA1c, POC (controlled diabetic range) (%)  Date Value  09/01/2023 6.0    LDL Chol Calc (NIH)  Date Value Ref Range Status  03/31/2023 160 (H) 0 - 99 mg/dL Final   Direct LDL  Date Value Ref Range Status  12/03/2009 71 mg/dL Final    Comment:    See lab report for associated comment(s)     Left:  - No evidence of deep vein thrombosis from the common femoral through the  popliteal veins.  - No evidence of superficial venous thrombosis.  - The deep venous system is competent.  - The great and small saphenous veins are competent.       Debby SAILOR. Magda, MD [facility] Phone Number: [phone] 10/31/2023 9:11 PM   Total time spent on preparing this  encounter including chart review, data review, collecting history, examining the patient, and coordinating care: 45 minutes  Portions of this report may have been transcribed using voice recognition software.  Every effort has been made to ensure accuracy; however, inadvertent computerized transcription errors may still be present.

## 2023-11-01 ENCOUNTER — Encounter: Payer: Self-pay | Admitting: Vascular Surgery

## 2023-11-01 ENCOUNTER — Ambulatory Visit (HOSPITAL_COMMUNITY)
Admission: RE | Admit: 2023-11-01 | Discharge: 2023-11-01 | Disposition: A | Discharge status: Home / Self Care | Source: Ambulatory Visit | Attending: Internal Medicine | Admitting: Internal Medicine

## 2023-11-01 ENCOUNTER — Ambulatory Visit: Payer: Self-pay | Admitting: Internal Medicine

## 2023-11-01 ENCOUNTER — Ambulatory Visit (INDEPENDENT_AMBULATORY_CARE_PROVIDER_SITE_OTHER): Admitting: Vascular Surgery

## 2023-11-01 VITALS — BP 151/80 | HR 60 | Temp 97.9°F | Ht 63.0 in | Wt 131.0 lb

## 2023-11-01 DIAGNOSIS — I517 Cardiomegaly: Secondary | ICD-10-CM | POA: Diagnosis not present

## 2023-11-01 DIAGNOSIS — I8393 Asymptomatic varicose veins of bilateral lower extremities: Secondary | ICD-10-CM | POA: Insufficient documentation

## 2023-11-01 LAB — ECHOCARDIOGRAM COMPLETE
Area-P 1/2: 2.37 cm2
S' Lateral: 2.4 cm

## 2023-11-01 NOTE — Progress Notes (Signed)
 Echocardiogram 2D Echocardiogram has been performed.  Cindy Taylor, RDCS 11/01/2023, 8:35 AM

## 2023-11-03 ENCOUNTER — Other Ambulatory Visit: Payer: Self-pay | Admitting: Internal Medicine

## 2023-12-21 ENCOUNTER — Telehealth: Payer: Self-pay | Admitting: Internal Medicine

## 2023-12-21 NOTE — Telephone Encounter (Signed)
 Patient confirmed appointment for 12/23/2023, through volunteer call.

## 2023-12-23 ENCOUNTER — Encounter: Payer: Self-pay | Admitting: Internal Medicine

## 2023-12-23 ENCOUNTER — Ambulatory Visit: Attending: Internal Medicine | Admitting: Internal Medicine

## 2023-12-23 VITALS — BP 174/79 | HR 66 | Temp 98.0°F | Ht 63.0 in | Wt 136.0 lb

## 2023-12-23 DIAGNOSIS — Z794 Long term (current) use of insulin: Secondary | ICD-10-CM

## 2023-12-23 DIAGNOSIS — Z23 Encounter for immunization: Secondary | ICD-10-CM

## 2023-12-23 DIAGNOSIS — E119 Type 2 diabetes mellitus without complications: Secondary | ICD-10-CM

## 2023-12-23 DIAGNOSIS — R918 Other nonspecific abnormal finding of lung field: Secondary | ICD-10-CM

## 2023-12-23 DIAGNOSIS — L84 Corns and callosities: Secondary | ICD-10-CM

## 2023-12-23 DIAGNOSIS — E1159 Type 2 diabetes mellitus with other circulatory complications: Secondary | ICD-10-CM

## 2023-12-23 DIAGNOSIS — Z8619 Personal history of other infectious and parasitic diseases: Secondary | ICD-10-CM

## 2023-12-23 DIAGNOSIS — I152 Hypertension secondary to endocrine disorders: Secondary | ICD-10-CM

## 2023-12-23 DIAGNOSIS — E1169 Type 2 diabetes mellitus with other specified complication: Secondary | ICD-10-CM | POA: Diagnosis not present

## 2023-12-23 DIAGNOSIS — Z9181 History of falling: Secondary | ICD-10-CM

## 2023-12-23 DIAGNOSIS — Z7984 Long term (current) use of oral hypoglycemic drugs: Secondary | ICD-10-CM

## 2023-12-23 DIAGNOSIS — J3489 Other specified disorders of nose and nasal sinuses: Secondary | ICD-10-CM

## 2023-12-23 DIAGNOSIS — E785 Hyperlipidemia, unspecified: Secondary | ICD-10-CM

## 2023-12-23 LAB — POCT GLYCOSYLATED HEMOGLOBIN (HGB A1C): HbA1c, POC (controlled diabetic range): 6.4 % (ref 0.0–7.0)

## 2023-12-23 LAB — GLUCOSE, POCT (MANUAL RESULT ENTRY): POC Glucose: 138 mg/dL — AB (ref 70–99)

## 2023-12-23 MED ORDER — LISINOPRIL-HYDROCHLOROTHIAZIDE 20-12.5 MG PO TABS: 0.5000 | ORAL_TABLET | Freq: Every day | ORAL | 1 refills | Status: DC

## 2023-12-23 MED ORDER — ACCU-CHEK GUIDE TEST VI STRP: ORAL_STRIP | 12 refills | Status: AC

## 2023-12-23 MED ORDER — ACCU-CHEK SOFTCLIX LANCETS MISC: 12 refills | Status: AC

## 2023-12-23 MED ORDER — METOPROLOL SUCCINATE ER 50 MG PO TB24: ORAL_TABLET | ORAL | 0 refills | Status: DC

## 2023-12-23 MED ORDER — METFORMIN HCL 1000 MG PO TABS: ORAL_TABLET | ORAL | 1 refills | Status: AC

## 2023-12-23 MED ORDER — ACCU-CHEK GUIDE W/DEVICE KIT
PACK | 0 refills | Status: AC
Start: 2023-12-23 — End: ?

## 2023-12-23 MED ORDER — PRAVASTATIN SODIUM 40 MG PO TABS: 40.0000 mg | ORAL_TABLET | Freq: Every day | ORAL | 1 refills | Status: DC

## 2023-12-23 NOTE — Patient Instructions (Signed)
  VISIT SUMMARY: Today, you had a routine physical exam and medication review. We discussed your blood pressure, diabetes, cholesterol, weight gain, recent fall, nasal sore, and foot care. We also reviewed your general health maintenance needs, including vaccines and eye care.  YOUR PLAN: -HYPERTENSION: Hypertension means high blood pressure. Your blood pressure is not well-controlled because you haven't received your lisinopril  and hydrochlorothiazide . We will resend the prescription to the pharmacy, and you should pick it up today. Please limit your salt intake to help manage your blood pressure.  -TYPE 2 DIABETES MELLITUS: Type 2 diabetes is a condition where your body doesn't use insulin  properly. Your diabetes is well-controlled with your current medications. We will prescribe an AccuCheck guide me kit, strips, and lancets for you to check your blood sugar once daily. Continue with your current diabetes medications.  -HYPERLIPIDEMIA: Hyperlipidemia means high cholesterol levels. Your LDL cholesterol is higher than the target level. We will check your cholesterol levels today to monitor this.  -WEIGHT GAIN IN CONTEXT OF OBESITY: You have gained 9 pounds since your last visit. Continue snacking on fruits and monitor your weight to prevent further gain.  -RIGHT MIDDLE LOBE PULMONARY NODULES: You have stable ground glass nodules in your right lung. We will repeat the CT scan in one year to monitor these nodules.  -NASAL SORE, LEFT SIDE: You have a sore in your left nostril that has been present for about a month. We will refer you to an ENT specialist for further evaluation.  -CALLUS AND CORN OF FEET: You have a callus on your big toes and a corn on your little toe. We will refer you to a podiatrist, Dr. Silva, for management.  -GENERAL HEALTH MAINTENANCE: You are due for your second shingles vaccine, flu vaccine, and an eye exam report. We will give you the flu vaccine today, and you should  get the second shingles vaccine at [facility]. We will request the eye exam report from [facility].  INSTRUCTIONS: Please schedule a Medicare wellness call on September 9th at 8:30 AM. Plan to follow up with us  in four months or sooner if needed.                      Contains text generated by Abridge.                                 Contains text generated by Abridge.

## 2023-12-23 NOTE — Progress Notes (Signed)
 Patient ID: Cindy Taylor, female    DOB: May 01, 1945  MRN: [id number]  CC: Chronic Disease Management (Chronic Disease Management/Discuss Lisinopril  and if continuation is needed/Insurance no longer covers OneTouch test strips - needs refill. /Fall at work on 12/02/23/Flu vax administered on 12/23/2023 - C.A.)   Subjective: Cindy Taylor is a 78 y.o. female who presents for chronic ds management. Her concerns today include:  Pt with DM, HTN, HL, Hep C treated with Harvoni, snuff user, CKD 2-3, shingles 03/2023, duodenal ulcer   Discussed the use of AI scribe software for clinical note transcription with the patient, who gave verbal consent to proceed.  History of Present Illness   Cindy Taylor is a 78 year old female with hypertension and diabetes who presents for a routine physical exam and medication review.  HTN: Patient should be on lisinopril /hydrochlorothiazide  20/12.5 mg half a tablet daily and metoprolol  50 mg 1-1/2 tablets daily .confirms taking the metoprolol  but states she has been out of the lisinopril /HCTZ citing some issue with the pharmacy.  She still has refills on current prescription.  She has not been able to check her blood pressure at home due to lack of a device. No chest pain, shortness of breath, or leg swelling.  DM: Results for orders placed or performed in visit on 12/23/23  POCT glucose (manual entry)   Collection Time: 12/23/23  8:50 AM  Result Value Ref Range   POC Glucose 138 (A) 70 - 99 mg/dl  POCT glycosylated hemoglobin (Hb A1C)   Collection Time: 12/23/23  8:51 AM  Result Value Ref Range   Hemoglobin A1C     HbA1c POC (<> result, manual entry)     HbA1c, POC (prediabetic range)     HbA1c, POC (controlled diabetic range) 6.4 0.0 - 7.0 %  Her diabetes management includes metformin  1000 mg in the morning and 500 mg in the evening, along with Lantus  insulin  10 units daily. She checks her blood sugar two to three times a week, with  readings typically between 118 and 125 mg/dL. No episodes of hypoglycemia.  HL: She is on pravastatin  40 mg daily for cholesterol management. Her last LDL cholesterol level was 839 mg/dL in December of the previous year.  On last visit, she was concerned about weight fluctuation in particular weight loss.   Since then she has experienced weight gain, currently weighing 136 pounds, up from 127 pounds in May. She reports eating a lot of fruits but not using Glucerna due to its expense.  She had a fall at work on August 15th, where she slipped while mopping and bumped her head twice against a door, briefly losing consciousness for less than a minute. She was seen by a company doctor who advised using ice for the head injury which sound like a contusion and wrapping her left wrist for mild sprain. The wrist returned to normal the next day. Denies any headaches at this time.  Hx of duodenal ulcer with positive H. pylori: She received a stool kit from her gastroenterologist to return stool sample to be checked for H. pylori antigen.  She has it at home but has not used as yet.  She had a CT scan of the chest in April that showed some ground glass nodular densities in the left lower lobe.  Had repeat scan in June that showed resolution.  2 nodules seen in the right middle lobe lungs were stable in size.  She was previously referred to a pulmonologist  prior to the second CAT scan but they were unable to reach her.  She reports a sore in her left nostril that she first noticed about a month ago, which she had experienced a couple of years ago as well. It is not bothersome but she is aware of its presence.   HM: She received her first shingles vaccine on May 15th at [facility] and is awaiting the second dose. She had an eye exam two weeks ago.      Patient Active Problem List   Diagnosis Date Noted   Skin tag of rectum 07/17/2021   Microalbuminuria 09/30/2020   Unintentional weight loss 10/22/2016    Chews tobacco 01/13/2016   Onychomycosis of multiple toenails with type 2 diabetes mellitus (HCC) 01/13/2016   History of hepatitis C 05/23/2014   DM type 2 (diabetes mellitus, type 2) (HCC) 11/16/2012   Hyperlipidemia associated with type 2 diabetes mellitus (HCC) 11/16/2012   Essential hypertension, benign 12/12/2008     Current Outpatient Medications on File Prior to Visit  Medication Sig Dispense Refill   aspirin  81 MG tablet Take 1 tablet (81 mg total) by mouth daily. 90 tablet 3   BD PEN NEEDLE SHORT ULTRAFINE 31G X 8 MM MISC USE AS DIRECTED WITH INSULIN  PEN 100 each 2   Bismuth  262 MG CHEW Chew 524 mg by mouth in the morning, at noon, in the evening, and at bedtime. 112 tablet 0   Calcium  Citrate-Vitamin D 250-200 MG-UNIT TABS Take 1 tablet by mouth 2 (two) times daily with a meal.     ibuprofen  (ADVIL ) 200 MG tablet Take 200-400 mg by mouth every 6 (six) hours as needed for mild pain (pain score 1-3) or moderate pain (pain score 4-6).     LANTUS  SOLOSTAR 100 UNIT/ML Solostar Pen INJECT 10 UNITS UNDER THE SKIN DAILY 15 mL 5   omeprazole  (PRILOSEC) 20 MG capsule Take 1 capsule (20 mg total) by mouth 2 (two) times daily before a meal. 28 capsule 0   No current facility-administered medications on file prior to visit.    No Known Allergies  Social History   Socioeconomic History   Marital status: Single    Spouse name: Not on file   Number of children: Not on file   Years of education: Not on file   Highest education level: Not on file  Occupational History   Not on file  Tobacco Use   Smoking status: Never   Smokeless tobacco: Current    Types: Snuff  Vaping Use   Vaping status: Never Used  Substance and Sexual Activity   Alcohol use: No    Comment: glass of wine at special occasion   Drug use: No   Sexual activity: Not Currently  Other Topics Concern   Not on file  Social History Narrative   Not on file   Social Drivers of Health   Financial Resource Strain:  High Risk (03/31/2023)   Overall Financial Resource Strain (CARDIA)    Difficulty of Paying Living Expenses: Hard  Food Insecurity: Food Insecurity Present (08/18/2023)   Hunger Vital Sign    Worried About Running Out of Food in the Last Year: Sometimes true    Ran Out of Food in the Last Year: Sometimes true  Transportation Needs: No Transportation Needs (08/18/2023)   PRAPARE - Administrator, Civil Service (Medical): No    Lack of Transportation (Non-Medical): No  Physical Activity: Inactive (03/31/2023)   Exercise Vital Sign  Days of Exercise per Week: 0 days    Minutes of Exercise per Session: 0 min  Stress: No Stress Concern Present (03/31/2023)   [employer] of Occupational Health - Occupational Stress Questionnaire    Feeling of Stress : Not at all  Social Connections: Moderately Integrated (03/31/2023)   Social Connection and Isolation Panel    Frequency of Communication with Friends and Family: More than three times a week    Frequency of Social Gatherings with Friends and Family: Once a week    Attends Religious Services: More than 4 times per year    Active Member of [employer] or Organizations: Yes    Attends Banker Meetings: Never    Marital Status: Widowed  Intimate Partner Violence: Not At Risk (08/18/2023)   Humiliation, Afraid, Rape, and Kick questionnaire    Fear of Current or Ex-Partner: No    Emotionally Abused: No    Physically Abused: No    Sexually Abused: No    Family History  Problem Relation Age of Onset   Stroke Mother    Rectal cancer Mother    Colon polyps Neg Hx    Colon cancer Neg Hx    Esophageal cancer Neg Hx    Stomach cancer Neg Hx     Past Surgical History:  Procedure Laterality Date   TOTAL ABDOMINAL HYSTERECTOMY     TUBAL LIGATION      ROS: Review of Systems Negative except as stated above  PHYSICAL EXAM: BP (!) 174/79 (BP Location: Left Arm, Patient Position: Sitting, Cuff Size: Normal)   Pulse 66    Temp 98 F (36.7 C) (Oral)   Ht 5' 3 (1.6 m)   Wt 136 lb (61.7 kg)   SpO2 98%   BMI 24.09 kg/m   Wt Readings from Last 3 Encounters:  12/23/23 136 lb (61.7 kg)  11/01/23 131 lb (59.4 kg)  09/01/23 127 lb (57.6 kg)    Physical Exam  General appearance - alert, well appearing, elderly AAF and in no distress Mental status - normal mood, behavior, speech, dress, motor activity, and thought processes Ears - bilateral TM's and external ear canals normal Nose - no lesion seen inside LT nostril.  No enlargement of nasal turbinates. Mouth -oral mucosa is moist.  She is edentulous. Chest - clear to auscultation, no wheezes, rales or rhonchi, symmetric air entry Heart - normal rate, regular rhythm, normal S1, S2, no murmurs, rubs, clicks or gallops Extremities - peripheral pulses normal, no pedal edema, no clubbing or cyanosis Diabetic Foot Exam - Simple   Simple Foot Form Diabetic Foot exam was performed with the following findings: Yes 12/23/2023  9:26 AM  Visual Inspection See comments: Yes Sensation Testing Intact to touch and monofilament testing bilaterally: Yes Pulse Check Posterior Tibialis and Dorsalis pulse intact bilaterally: Yes Comments Toenails on the big toes are thick and discolored.  Calluses on the ball of both big toes and on the lateral aspect of the left fifth toe.         Latest Ref Rng & Units 08/02/2023    6:44 AM 03/31/2023    1:43 PM 03/31/2023   12:39 PM  CMP  Glucose 70 - 99 mg/dL 96  97  95   BUN 8 - 23 mg/dL 14  18  17    Creatinine 0.44 - 1.00 mg/dL 9.16  9.10  9.18   Sodium 135 - 145 mmol/L 140  141  136   Potassium 3.5 - 5.1 mmol/L 4.1  4.7  4.1   Chloride 98 - 111 mmol/L 104  101  102   CO2 22 - 32 mmol/L 26  24  26    Calcium  8.9 - 10.3 mg/dL 9.9  9.8  9.5   Total Protein 6.5 - 8.1 g/dL 7.8  7.5  7.6   Total Bilirubin 0.0 - 1.2 mg/dL 0.9  0.3  0.5   Alkaline Phos 38 - 126 U/L 47  63  43   AST 15 - 41 U/L 23  19  19    ALT 0 - 44 U/L 17  11   14     Lipid Panel     Component Value Date/Time   CHOL 231 (H) 03/31/2023 1343   TRIG 87 03/31/2023 1343   HDL 56 03/31/2023 1343   CHOLHDL 4.1 03/31/2023 1343   CHOLHDL 2.8 07/29/2015 0918   VLDL 16 07/29/2015 0918   LDLCALC 160 (H) 03/31/2023 1343   LDLDIRECT 71 12/03/2009 2014    CBC    Component Value Date/Time   WBC 6.2 08/02/2023 0644   RBC 4.92 08/02/2023 0644   HGB 12.9 08/02/2023 0644   HGB 14.1 03/31/2023 1343   HCT 39.6 08/02/2023 0644   HCT 45.9 03/31/2023 1343   PLT 228 08/02/2023 0644   PLT 200 03/31/2023 1343   MCV 80.5 08/02/2023 0644   MCV 84 03/31/2023 1343   MCH 26.2 08/02/2023 0644   MCHC 32.6 08/02/2023 0644   RDW 14.9 08/02/2023 0644   RDW 13.5 03/31/2023 1343   LYMPHSABS 3.0 08/02/2023 0644   MONOABS 0.6 08/02/2023 0644   EOSABS 0.0 08/02/2023 0644   BASOSABS 0.1 08/02/2023 0644    ASSESSMENT AND PLAN: 1. Type 2 diabetes mellitus with other specified complication, with long-term current use of insulin  (HCC) (Primary) At goal. Continue metformin  and Lantus  insulin .  Continue healthy eating habits. - metFORMIN  (GLUCOPHAGE ) 1000 MG tablet; TAKE 1 TABLET(1000 MG) BY MOUTH in a.m and 1/2 tab in p.m  Dispense: 135 tablet; Refill: 1 - POCT glycosylated hemoglobin (Hb A1C) - POCT glucose (manual entry) - glucose blood (ACCU-CHEK GUIDE TEST) test strip; UAD to check blood sugars once a day  Dispense: 100 each; Refill: 12 - Blood Glucose Monitoring Suppl (ACCU-CHEK GUIDE) w/Device KIT; UAD to check blood sugars once a day  Dispense: 1 kit; Refill: 0 - Accu-Chek Softclix Lancets lancets; UAD to check blood sugars once a day  Dispense: 100 each; Refill: 12 - Ambulatory referral to Podiatry  2. Diabetes mellitus treated with oral medication (HCC) See #1 above.  3. Hypertension associated with diabetes (HCC) Not at goal.  She has been out of lisinopril /HCTZ even though prescription that was sent in July had refills on it.  New prescription sent.   Continue metoprolol . - metoprolol  succinate (TOPROL -XL) 50 MG 24 hr tablet; TAKE 1 AND 1/2 TABLETS BY MOUTH DAILY WITH OR IMMEDIATELY FOLLOWING A MEAL  Dispense: 135 tablet; Refill: 0 - lisinopril -hydrochlorothiazide  (ZESTORETIC ) 20-12.5 MG tablet; Take 0.5 tablets by mouth daily.  Dispense: 45 tablet; Refill: 1  4. Hyperlipidemia associated with type 2 diabetes mellitus (HCC) - pravastatin  (PRAVACHOL ) 40 MG tablet; Take 1 tablet (40 mg total) by mouth daily.  Dispense: 90 tablet; Refill: 1 - Lipid panel  5. Pre-ulcerative corn or callous - Ambulatory referral to Podiatry  6. Lesion of nose I do not appreciate a lesion on exam, but since patient cannot feel it and it has been there for a month will refer to ENT - Ambulatory referral to ENT  7. History of recent fall Advised to be careful at work on slippery floors.  Luckily she did not suffer serious injury.  8. History of Helicobacter pylori infection Advise to use the stool kit given to her by GI and return it to their office/lab  9. Lung nodules Stable in size per radiology report.   10. Need for influenza vaccination Given today   Patient was given the opportunity to ask questions.  Patient verbalized understanding of the plan and was able to repeat key elements of the plan.   This documentation was completed using Paediatric nurse.  Any transcriptional errors are unintentional.  Orders Placed This Encounter  Procedures   Flu vaccine HIGH DOSE PF(Fluzone Trivalent)   Lipid panel   Ambulatory referral to Podiatry   Ambulatory referral to ENT   POCT glycosylated hemoglobin (Hb A1C)   POCT glucose (manual entry)     Requested Prescriptions   Signed Prescriptions Disp Refills   metoprolol  succinate (TOPROL -XL) 50 MG 24 hr tablet 135 tablet 0    Sig: TAKE 1 AND 1/2 TABLETS BY MOUTH DAILY WITH OR IMMEDIATELY FOLLOWING A MEAL   pravastatin  (PRAVACHOL ) 40 MG tablet 90 tablet 1    Sig: Take 1 tablet (40  mg total) by mouth daily.   metFORMIN  (GLUCOPHAGE ) 1000 MG tablet 135 tablet 1    Sig: TAKE 1 TABLET(1000 MG) BY MOUTH in a.m and 1/2 tab in p.m   lisinopril -hydrochlorothiazide  (ZESTORETIC ) 20-12.5 MG tablet 45 tablet 1    Sig: Take 0.5 tablets by mouth daily.   glucose blood (ACCU-CHEK GUIDE TEST) test strip 100 each 12    Sig: UAD to check blood sugars once a day   Blood Glucose Monitoring Suppl (ACCU-CHEK GUIDE) w/Device KIT 1 kit 0    Sig: UAD to check blood sugars once a day   Accu-Chek Softclix Lancets lancets 100 each 12    Sig: UAD to check blood sugars once a day    Return in about 4 months (around 04/23/2024).  Barnie Louder, MD, FACP

## 2023-12-24 ENCOUNTER — Ambulatory Visit: Payer: Self-pay | Admitting: Internal Medicine

## 2023-12-24 LAB — LIPID PANEL
Chol/HDL Ratio: 2.3 ratio (ref 0.0–4.4)
Cholesterol, Total: 149 mg/dL (ref 100–199)
HDL: 65 mg/dL (ref >39)
LDL Chol Calc (NIH): 72 mg/dL (ref 0–99)
Triglycerides: 60 mg/dL (ref 0–149)
VLDL Cholesterol Cal: 12 mg/dL (ref 5–40)

## 2023-12-26 ENCOUNTER — Encounter (INDEPENDENT_AMBULATORY_CARE_PROVIDER_SITE_OTHER): Payer: Self-pay

## 2023-12-27 ENCOUNTER — Ambulatory Visit: Payer: Medicare Other | Attending: Internal Medicine

## 2023-12-27 VITALS — Ht 63.0 in | Wt 136.0 lb

## 2023-12-27 DIAGNOSIS — Z0000 Encounter for general adult medical examination without abnormal findings: Secondary | ICD-10-CM | POA: Diagnosis not present

## 2023-12-27 NOTE — Progress Notes (Signed)
 Subjective:   Cindy Taylor is a 78 y.o. who presents for a Medicare Wellness preventive visit.  As a reminder, Annual Wellness Visits don't include a physical exam, and some assessments may be limited, especially if this visit is performed virtually. We may recommend an in-person follow-up visit with your provider if needed.  Visit Complete: Virtual I connected with  [name] on 12/27/23 by a audio enabled telemedicine application and verified that I am speaking with the correct person using two identifiers.  Patient Location: Home  Provider Location: Office/Clinic  I discussed the limitations of evaluation and management by telemedicine. The patient expressed understanding and agreed to proceed.  Vital Signs: Because this visit was a virtual/telehealth visit, some criteria may be missing or patient reported. Any vitals not documented were not able to be obtained and vitals that have been documented are patient reported.  VideoDeclined- This patient declined Librarian, academic. Therefore the visit was completed with audio only.  Persons Participating in Visit: Patient.  AWV Questionnaire: No: Patient Medicare AWV questionnaire was not completed prior to this visit.  Cardiac Risk Factors include: advanced age (>18men, >2 women);diabetes mellitus;dyslipidemia;hypertension;family history of premature cardiovascular disease     Objective:    Today's Vitals   12/27/23 0846  Weight: 136 lb (61.7 kg)  Height: 5' 3 (1.6 m)  PainSc: 0-No pain   Body mass index is 24.09 kg/m.     12/27/2023    8:50 AM 08/01/2023    7:43 PM 03/31/2023   12:50 PM 12/21/2022    8:38 AM 11/17/2021   10:08 AM 12/30/2020   10:53 AM 11/17/2020    4:48 PM  Advanced Directives  Does Patient Have a Medical Advance Directive? No No No No No No No  Would patient like information on creating a medical advance directive? No - Patient declined No - Patient  declined No - Patient declined Yes (MAU/Ambulatory/Procedural Areas - Information given) No - Patient declined No - Patient declined No - Patient declined    Current Medications (verified) Outpatient Encounter Medications as of 12/27/2023  Medication Sig   Accu-Chek Softclix Lancets lancets UAD to check blood sugars once a day   aspirin  81 MG tablet Take 1 tablet (81 mg total) by mouth daily.   BD PEN NEEDLE SHORT ULTRAFINE 31G X 8 MM MISC USE AS DIRECTED WITH INSULIN  PEN   Bismuth  262 MG CHEW Chew 524 mg by mouth in the morning, at noon, in the evening, and at bedtime.   Blood Glucose Monitoring Suppl (ACCU-CHEK GUIDE) w/Device KIT UAD to check blood sugars once a day   Calcium  Citrate-Vitamin D 250-200 MG-UNIT TABS Take 1 tablet by mouth 2 (two) times daily with a meal.   glucose blood (ACCU-CHEK GUIDE TEST) test strip UAD to check blood sugars once a day   ibuprofen  (ADVIL ) 200 MG tablet Take 200-400 mg by mouth every 6 (six) hours as needed for mild pain (pain score 1-3) or moderate pain (pain score 4-6).   LANTUS  SOLOSTAR 100 UNIT/ML Solostar Pen INJECT 10 UNITS UNDER THE SKIN DAILY   lisinopril -hydrochlorothiazide  (ZESTORETIC ) 20-12.5 MG tablet Take 0.5 tablets by mouth daily.   metFORMIN  (GLUCOPHAGE ) 1000 MG tablet TAKE 1 TABLET(1000 MG) BY MOUTH in a.m and 1/2 tab in p.m   metoprolol  succinate (TOPROL -XL) 50 MG 24 hr tablet TAKE 1 AND 1/2 TABLETS BY MOUTH DAILY WITH OR IMMEDIATELY FOLLOWING A MEAL   omeprazole  (PRILOSEC) 20 MG capsule Take 1 capsule (20  mg total) by mouth 2 (two) times daily before a meal.   pravastatin  (PRAVACHOL ) 40 MG tablet Take 1 tablet (40 mg total) by mouth daily.   No facility-administered encounter medications on file as of 12/27/2023.    Allergies (verified) Patient has no known allergies.   History: Past Medical History:  Diagnosis Date   Arthritis    Combined senile cataract    Diabetes mellitus    mellitus? x 9 years   Hepatitis C    HTN  (hypertension)    x8 years   Hyperlipidemia    Hypertensive retinopathy of both eyes    Past Surgical History:  Procedure Laterality Date   TOTAL ABDOMINAL HYSTERECTOMY     TUBAL LIGATION     Family History  Problem Relation Age of Onset   Stroke Mother    Rectal cancer Mother    Colon polyps Neg Hx    Colon cancer Neg Hx    Esophageal cancer Neg Hx    Stomach cancer Neg Hx    Social History   Socioeconomic History   Marital status: Single    Spouse name: Not on file   Number of children: Not on file   Years of education: Not on file   Highest education level: Not on file  Occupational History   Not on file  Tobacco Use   Smoking status: Never   Smokeless tobacco: Current    Types: Snuff  Vaping Use   Vaping status: Never Used  Substance and Sexual Activity   Alcohol use: No    Comment: glass of wine at special occasion   Drug use: No   Sexual activity: Not Currently  Other Topics Concern   Not on file  Social History Narrative   Not on file   Social Drivers of Health   Financial Resource Strain: Medium Risk (12/27/2023)   Overall Financial Resource Strain (CARDIA)    Difficulty of Paying Living Expenses: Somewhat hard  Food Insecurity: Food Insecurity Present (12/27/2023)   Hunger Vital Sign    Worried About Running Out of Food in the Last Year: Sometimes true    Ran Out of Food in the Last Year: Sometimes true  Transportation Needs: No Transportation Needs (12/27/2023)   PRAPARE - Administrator, Civil Service (Medical): No    Lack of Transportation (Non-Medical): No  Physical Activity: Sufficiently Active (12/27/2023)   Exercise Vital Sign    Days of Exercise per Week: 5 days    Minutes of Exercise per Session: 30 min  Stress: No Stress Concern Present (12/27/2023)   [employer] of Occupational Health - Occupational Stress Questionnaire    Feeling of Stress: Not at all  Social Connections: Moderately Integrated (12/27/2023)   Social Connection  and Isolation Panel    Frequency of Communication with Friends and Family: More than three times a week    Frequency of Social Gatherings with Friends and Family: Once a week    Attends Religious Services: More than 4 times per year    Active Member of [employer] or Organizations: Yes    Attends Banker Meetings: Never    Marital Status: Widowed    Tobacco Counseling Ready to quit: Not Answered Counseling given: Not Answered    Clinical Intake:  Pre-visit preparation completed: Yes  Pain : No/denies pain Pain Score: 0-No pain     BMI - recorded: 24.09 Nutritional Status: BMI of 19-24  Normal Nutritional Risks: None Diabetes: Yes CBG done?: No Did  pt. bring in CBG monitor from home?: No  Lab Results  Component Value Date   HGBA1C 6.4 12/23/2023   HGBA1C 6.0 09/01/2023   HGBA1C 6.3 03/31/2023     How often do you need to have someone help you when you read instructions, pamphlets, or other written materials from your doctor or pharmacy?: 1 - Never  Interpreter Needed?: No  Information entered by :: Andreya Lacks N. Porshea Janowski, LPN.   Activities of Daily Living     12/27/2023    8:50 AM  In your present state of health, do you have any difficulty performing the following activities:  Hearing? 0  Vision? 0  Difficulty concentrating or making decisions? 1  Comment MEMORY; HAVE GOOD RECALL  Walking or climbing stairs? 0  Dressing or bathing? 0  Doing errands, shopping? 0  Preparing Food and eating ? N  Using the Toilet? N  In the past six months, have you accidently leaked urine? N  Do you have problems with loss of bowel control? N  Managing your Medications? N  Managing your Finances? N  Housekeeping or managing your Housekeeping? N    Patient Care Team: Vicci Barnie NOVAK, MD as PCP - General (Internal Medicine) Silva Juliene SAUNDERS, DPM as Consulting Physician (Podiatry) CLEM BRANDS, [place] as Physician Assistant (Ophthalmology) Octavia, Charlie Hamilton, MD  as Consulting Physician (Ophthalmology)  I have updated your Care Teams any recent Medical Services you may have received from other providers in the past year.     Assessment:   This is a routine wellness examination for Haile.  Hearing/Vision screen Hearing Screening - Comments:: Denies hearing difficulties.  Vision Screening - Comments:: Wears rx glasses - up to date with routine eye exams with [facility]    Goals Addressed             This Visit's Progress    12/27/2023: To maintain, stay independent and active on my job.         Depression Screen     12/27/2023    8:57 AM 12/23/2023    8:48 AM 09/01/2023   10:51 AM 08/24/2023    4:30 PM 08/18/2023    9:21 AM 05/03/2023   10:25 AM 03/31/2023   11:26 AM  PHQ 2/9 Scores  PHQ - 2 Score 2 2 1 2 1 2 3   PHQ- 9 Score 5 5 3 6  5 5     Fall Risk     12/27/2023    8:55 AM 12/23/2023    8:48 AM 09/01/2023   10:51 AM 08/24/2023    4:30 PM 12/21/2022    8:36 AM  Fall Risk   Falls in the past year? 1 1 0 0 1  Number falls in past yr: 0 0 0 0 0  Injury with Fall? 1 1 0 0 0  Risk for fall due to :  No Fall Risks No Fall Risks No Fall Risks History of fall(s)  Follow up Falls evaluation completed;Education provided Falls evaluation completed Falls evaluation completed Falls evaluation completed Falls prevention discussed;Education provided;Falls evaluation completed    MEDICARE RISK AT HOME:  Medicare Risk at Home Any stairs in or around the home?: No If so, are there any without handrails?: No Home free of loose throw rugs in walkways, pet beds, electrical cords, etc?: Yes Adequate lighting in your home to reduce risk of falls?: Yes Life alert?: No Use of a cane, walker or w/c?: No Grab bars in the bathroom?: Yes Shower chair  or bench in shower?: No Elevated toilet seat or a handicapped toilet?: No  TIMED UP AND GO:  Was the test performed?  No  Cognitive Function: Declined/Normal: No cognitive concerns noted by patient  or family. Patient alert, oriented, able to answer questions appropriately and recall recent events. No signs of memory loss or confusion.    12/27/2023    8:50 AM 11/17/2021   10:13 AM 11/17/2020    4:49 PM  MMSE - Mini Mental State Exam  Not completed: Unable to complete    Orientation to time  5 5  Orientation to Place  5 5  Registration  3 3  Attention/ Calculation  5 5  Recall  3 1  Language- name 2 objects  2 2  Language- repeat  1 1  Language- follow 3 step command  3 3  Language- read & follow direction  1 1  Write a sentence  1 1  Copy design  1 1  Total score  30 28        12/27/2023    8:55 AM 12/21/2022    8:38 AM  6CIT Screen  What Year? 0 points 0 points  What month? 0 points 0 points  What time? 0 points 0 points  Count back from 20 0 points 0 points  Months in reverse 0 points 0 points  Repeat phrase 0 points 0 points  Total Score 0 points 0 points    Immunizations Immunization History  Administered Date(s) Administered   Fluad Trivalent(High Dose 65+) 05/03/2023   INFLUENZA, HIGH DOSE SEASONAL PF 12/23/2023   Influenza Split 05/10/2012   Influenza,inj,Quad PF,6+ Mos 02/16/2013, 01/17/2014, 01/13/2016, 01/25/2017, 12/09/2017, 02/15/2019, 12/06/2019, 03/17/2021, 12/25/2021   Pneumococcal Conjugate-13 01/17/2014   Pneumococcal Polysaccharide-23 07/24/2013   Tdap 01/13/2016   Zoster Recombinant(Shingrix) 09/01/2023    Screening Tests Health Maintenance  Topic Date Due   COVID-19 Vaccine (1) Never done   OPHTHALMOLOGY EXAM  07/28/2023   Zoster Vaccines- Shingrix (2 of 2) 10/27/2023   Diabetic kidney evaluation - Urine ACR  03/30/2024   HEMOGLOBIN A1C  06/21/2024   Diabetic kidney evaluation - eGFR measurement  08/01/2024   FOOT EXAM  12/22/2024   Medicare Annual Wellness (AWV)  12/26/2024   DTaP/Tdap/Td (2 - Td or Tdap) 01/12/2026   Pneumococcal Vaccine: 50+ Years  Completed   Influenza Vaccine  Completed   DEXA SCAN  Completed   Hepatitis C Screening   Completed   HPV VACCINES  Aged Out   Meningococcal B Vaccine  Aged Out   Colonoscopy  Discontinued    Health Maintenance Items Addressed: Yes Patient is due for the following: Shingrix vaccine (which she has scheduled with pharmacy).  Additional Screening:  Vision Screening: Recommended annual ophthalmology exams for early detection of glaucoma and other disorders of the eye. Is the patient up to date with their annual eye exam?  Yes  Who is the provider or what is the name of the office in which the patient attends annual eye exams? Clem Brands, PA at [facility]  Dental Screening: Recommended annual dental exams for proper oral hygiene  Community Resource Referral / Chronic Care Management: CRR required this visit?  No   CCM required this visit?  No   Plan:    I have personally reviewed and noted the following in the patient's chart:   Medical and social history Use of alcohol, tobacco or illicit drugs  Current medications and supplements including opioid prescriptions. Patient is not currently  taking opioid prescriptions. Functional ability and status Nutritional status Physical activity Advanced directives List of other physicians Hospitalizations, surgeries, and ER visits in previous 12 months Vitals Screenings to include cognitive, depression, and falls Referrals and appointments  In addition, I have reviewed and discussed with patient certain preventive protocols, quality metrics, and best practice recommendations. A written personalized care plan for preventive services as well as general preventive health recommendations were provided to patient.   Roz LOISE Fuller, LPN   [id number]   After Visit Summary: (MyChart) Due to this being a telephonic visit, the after visit summary with patients personalized plan was offered to patient via MyChart   Notes: Nothing significant to report at this time.SABRA

## 2023-12-27 NOTE — Patient Instructions (Signed)
 Cindy Taylor,  Thank you for taking the time for your Medicare Wellness Visit. I appreciate your continued commitment to your health goals. Please review the care plan we discussed, and feel free to reach out if I can assist you further.  Medicare recommends these wellness visits once per year to help you and your care team stay ahead of potential health issues. These visits are designed to focus on prevention, allowing your provider to concentrate on managing your acute and chronic conditions during your regular appointments.  Please note that Annual Wellness Visits do not include a physical exam. Some assessments may be limited, especially if the visit was conducted virtually. If needed, we may recommend a separate in-person follow-up with your provider.  Ongoing Care Seeing your primary care provider every 3 to 6 months helps us  monitor your health and provide consistent, personalized care.   Referrals If a referral was made during today's visit and you haven't received any updates within two weeks, please contact the referred provider directly to check on the status.  Recommended Screenings:  Health Maintenance  Topic Date Due   COVID-19 Vaccine (1) Never done   Eye exam for diabetics  07/28/2023   Zoster (Shingles) Vaccine (2 of 2) 10/27/2023   Yearly kidney health urinalysis for diabetes  03/30/2024   Hemoglobin A1C  06/21/2024   Yearly kidney function blood test for diabetes  08/01/2024   Complete foot exam   12/22/2024   Medicare Annual Wellness Visit  12/26/2024   DTaP/Tdap/Td vaccine (2 - Td or Tdap) 01/12/2026   Pneumococcal Vaccine for age over 70  Completed   Flu Shot  Completed   DEXA scan (bone density measurement)  Completed   Hepatitis C Screening  Completed   HPV Vaccine  Aged Out   Meningitis B Vaccine  Aged Out   Colon Cancer Screening  Discontinued       12/27/2023    8:50 AM  Advanced Directives  Does Patient Have a Medical Advance Directive? No  Would  patient like information on creating a medical advance directive? No - Patient declined   Advance Care Planning is important because it: Ensures you receive medical care that aligns with your values, goals, and preferences. Provides guidance to your family and loved ones, reducing the emotional burden of decision-making during critical moments.  Vision: Annual vision screenings are recommended for early detection of glaucoma, cataracts, and diabetic retinopathy. These exams can also reveal signs of chronic conditions such as diabetes and high blood pressure.  Dental: Annual dental screenings help detect early signs of oral cancer, gum disease, and other conditions linked to overall health, including heart disease and diabetes.  Please see the attached documents for additional preventive care recommendations.

## 2024-01-02 ENCOUNTER — Ambulatory Visit: Admitting: Internal Medicine

## 2024-01-23 ENCOUNTER — Ambulatory Visit: Admitting: Podiatry

## 2024-01-23 VITALS — Ht 63.0 in | Wt 136.0 lb

## 2024-01-23 DIAGNOSIS — E1129 Type 2 diabetes mellitus with other diabetic kidney complication: Secondary | ICD-10-CM | POA: Diagnosis not present

## 2024-01-23 DIAGNOSIS — B351 Tinea unguium: Secondary | ICD-10-CM | POA: Diagnosis not present

## 2024-01-23 DIAGNOSIS — M79674 Pain in right toe(s): Secondary | ICD-10-CM

## 2024-01-23 DIAGNOSIS — L84 Corns and callosities: Secondary | ICD-10-CM

## 2024-01-23 DIAGNOSIS — M79675 Pain in left toe(s): Secondary | ICD-10-CM

## 2024-01-23 MED ORDER — TERBINAFINE HCL 250 MG PO TABS: 250.0000 mg | ORAL_TABLET | Freq: Every day | ORAL | 0 refills | Status: AC

## 2024-01-23 NOTE — Progress Notes (Signed)
  Subjective:  Patient ID: Cindy Taylor, female    DOB: [birth year],  MRN: [id number]  Chief Complaint  Patient presents with   Diabetes    Rm 10 DFC (A1C 6.0).    78 y.o. female presents with the above complaint. History confirmed with patient.  She presents for follow-up for diabetes she says is well-controlled, A1c is 6%.  Her nails are thick and brown discolored they cause discomfort in shoe gear.  She also has multiple calluses that are painful for her.  These are recurrent.  She would also like to be treated for her onychomycosis  Objective:  Physical Exam: warm, good capillary refill, no trophic changes or ulcerative lesions, normal DP and PT pulses, and normal sensory exam. Left Foot: dystrophic yellowed discolored nail plates with subungual debris and painful callus submetatarsal 1, medial hallux, lateral fifth toe Right Foot: dystrophic yellowed discolored nail plates with subungual debris and painful callus right lateral fifth toe and medial hallux   Metabolic panel April 2025 normal  Assessment:   1. Pain due to onychomycosis of toenails of both feet   2. Callus of foot   3. Type 2 diabetes mellitus with microalbuminuria, with long-term current use of insulin  (HCC)      Plan:  Patient was evaluated and treated and all questions answered.   Patient educated on diabetes. Discussed proper diabetic foot care and discussed risks and complications of disease. Educated patient in depth on reasons to return to the office immediately should he/she discover anything concerning or new on the feet. All questions answered. Discussed proper shoes as well.   Discussed the etiology and treatment options for the condition in detail with the patient. Educated patient on the topical and oral treatment options for mycotic nails. Recommended debridement of the nails today. Sharp and mechanical debridement performed of all painful and mycotic nails today. Nails debrided in length and  thickness using a nail nipper to level of comfort. Discussed treatment options including appropriate shoe gear. Follow up as needed for painful nails.  Discussed treatment of the onychomycosis including oral therapy with Lamisil.  Discussed risk benefits and potential side effects.  Last metabolic panel in April was reviewed.  She has no other contraindications.  Rx sent to pharmacy for 90-day course.  All symptomatic hyperkeratoses were safely debrided with a sterile #15 blade to patient's level of comfort without incident. We discussed preventative and palliative care of these lesions including supportive and accommodative shoegear, padding, prefabricated and custom molded accommodative orthoses, use of a pumice stone and lotions/creams daily.   Return in about 3 months (around 04/24/2024) for at risk diabetic foot care.

## 2024-02-08 ENCOUNTER — Other Ambulatory Visit: Payer: Self-pay | Admitting: Internal Medicine

## 2024-02-08 DIAGNOSIS — E1169 Type 2 diabetes mellitus with other specified complication: Secondary | ICD-10-CM

## 2024-03-02 ENCOUNTER — Other Ambulatory Visit: Payer: Self-pay | Admitting: Internal Medicine

## 2024-03-02 DIAGNOSIS — I152 Hypertension secondary to endocrine disorders: Secondary | ICD-10-CM

## 2024-03-05 ENCOUNTER — Telehealth: Payer: Self-pay | Admitting: Internal Medicine

## 2024-03-05 DIAGNOSIS — I152 Hypertension secondary to endocrine disorders: Secondary | ICD-10-CM

## 2024-03-05 NOTE — Telephone Encounter (Unsigned)
 Copied from CRM #[id number]. Topic: Clinical - Medication Refill >> Mar 05, 2024  3:42 PM Amber H wrote: Medication: lisinopril -hydrochlorothiazide  (ZESTORETIC ) 20-12.5 MG tablet, metoprolol  succinate (TOPROL -XL) 50 MG 24 hr tablet [[id number]]  Has the patient contacted their pharmacy? Yes (Agent: If no, request that the patient contact the pharmacy for the refill. If patient does not wish to contact the pharmacy document the reason why and proceed with request.) (Agent: If yes, when and what did the pharmacy advise?)  This is the patient's preferred pharmacy:  [facility] [phone] - [place], [place] - [address] AT [facility] [address] [place] [address] Phone: [phone] Fax: [phone]    Is this the correct pharmacy for this prescription? Yes If no, delete pharmacy and type the correct one.   Has the prescription been filled recently? Yes  Is the patient out of the medication? Yes  Has the patient been seen for an appointment in the last year OR does the patient have an upcoming appointment? Yes  Can we respond through MyChart? Yes  Agent: Please be advised that Rx refills may take up to 3 business days. We ask that you follow-up with your pharmacy.

## 2024-03-08 NOTE — Telephone Encounter (Signed)
 Requested Prescriptions  Pending Prescriptions Disp Refills   lisinopril -hydrochlorothiazide  (ZESTORETIC ) 20-12.5 MG tablet 45 tablet 1    Sig: Take 0.5 tablets by mouth daily.     Cardiovascular:  ACEI + Diuretic Combos Failed - 03/08/2024 12:30 PM      Failed - Na in normal range and within 180 days    Sodium  Date Value Ref Range Status  08/02/2023 140 135 - 145 mmol/L Final  03/31/2023 141 134 - 144 mmol/L Final         Failed - K in normal range and within 180 days    Potassium  Date Value Ref Range Status  08/02/2023 4.1 3.5 - 5.1 mmol/L Final         Failed - Cr in normal range and within 180 days    Creat  Date Value Ref Range Status  07/29/2015 0.75 0.50 - 0.99 mg/dL Final   Creatinine, Ser  Date Value Ref Range Status  08/02/2023 0.83 0.44 - 1.00 mg/dL Final   Creatinine, POC  Date Value Ref Range Status  12/09/2017 50 mg/dL Final   Creatinine, Urine  Date Value Ref Range Status  11/16/2012 110.4 mg/dL Final         Failed - eGFR is 30 or above and within 180 days    GFR, Est African American  Date Value Ref Range Status  12/13/2014 71 >=60 mL/min Final   GFR calc Af Amer  Date Value Ref Range Status  09/20/2019 68 >59 mL/min/1.73 Final    Comment:    **[facility] currently reports eGFR in compliance with the current**   recommendations of the [employer]. [facility] will   update reporting as new guidelines are published from the NKF-ASN   Task force.    GFR, Est Non African American  Date Value Ref Range Status  12/13/2014 61 >=60 mL/min Final    Comment:      The estimated GFR is a calculation valid for adults (>=71 years old) that uses the CKD-EPI algorithm to adjust for age and sex. It is   not to be used for children, pregnant women, hospitalized patients,    patients on dialysis, or with rapidly changing kidney function. According to the NKDEP, eGFR >89 is normal, 60-89 shows mild impairment, 30-59 shows moderate impairment,  15-29 shows severe impairment and <15 is ESRD.      GFR, Estimated  Date Value Ref Range Status  08/02/2023 >60 >60 mL/min Final    Comment:    (NOTE) Calculated using the CKD-EPI Creatinine Equation (2021)    eGFR  Date Value Ref Range Status  03/31/2023 68 >59 mL/min/1.73 Final         Failed - Last BP in normal range    BP Readings from Last 1 Encounters:  12/23/23 (!) 174/79         Passed - Patient is not pregnant      Passed - Valid encounter within last 6 months    Recent Outpatient Visits           2 months ago Type 2 diabetes mellitus with other specified complication, with long-term current use of insulin  (HCC)   [place] [facility] - A Dept Of [place]. [facility] Vicci Sober B, MD   6 months ago Type 2 diabetes mellitus without complication, with long-term current use of insulin  [facility])   [place] [facility] - A Dept Of [place]. [facility] [place], Caban B,  MD   6 months ago Type 2 diabetes mellitus with microalbuminuria, with long-term current use of insulin  [facility])   [place] [facility] - A Dept Of [place]. [facility], Jon M, [place]   10 months ago Unintentional weight loss   [place] [facility] [place] - A Dept Of [place]. [facility] Vicci Barnie NOVAK, MD   11 months ago Herpes zoster without complication   [place] [facility] - A Dept Of [place]. [facility] Vicci Barnie NOVAK, MD       Future Appointments             In 1 month Vicci Barnie NOVAK, MD [facility] [place] - A Dept Of [place]. [facility] [address]             metoprolol  succinate (TOPROL -XL) 50 MG 24 hr tablet 135 tablet 0    Sig: TAKE 1 AND 1/2 TABLETS BY MOUTH DAILY WITH OR IMMEDIATELY FOLLOWING A MEAL     Cardiovascular:  Beta Blockers Failed - 03/08/2024 12:30 PM      Failed - Last BP in normal range    BP Readings from Last 1  Encounters:  12/23/23 (!) 174/79         Passed - Last Heart Rate in normal range    Pulse Readings from Last 1 Encounters:  12/23/23 66         Passed - Valid encounter within last 6 months    Recent Outpatient Visits           2 months ago Type 2 diabetes mellitus with other specified complication, with long-term current use of insulin  (HCC)   [place] [facility] - A Dept Of [place]. [facility] Vicci Barnie B, MD   6 months ago Type 2 diabetes mellitus without complication, with long-term current use of insulin  [facility])   [place] [facility] - A Dept Of [place]. [facility] Vicci Barnie B, MD   6 months ago Type 2 diabetes mellitus with microalbuminuria, with long-term current use of insulin  [facility])   [place] [facility] - A Dept Of [place]. [facility], Jon M, [place]   10 months ago Unintentional weight loss   [place] [facility] [place] - A Dept Of [place]. [facility] Vicci Barnie NOVAK, MD   11 months ago Herpes zoster without complication   [place] [facility] - A Dept Of [place]. [facility] Vicci Barnie NOVAK, MD       Future Appointments             In 1 month Vicci Barnie NOVAK, MD [facility] [place] - A Dept Of [place]. [facility] [place]

## 2024-03-22 ENCOUNTER — Other Ambulatory Visit: Payer: Self-pay | Admitting: Internal Medicine

## 2024-03-22 DIAGNOSIS — I152 Hypertension secondary to endocrine disorders: Secondary | ICD-10-CM

## 2024-03-22 NOTE — Telephone Encounter (Signed)
 Copied from CRM #[id number]. Topic: Clinical - Medication Refill >> Mar 22, 2024 10:08 AM Hadassah PARAS wrote: Medication: metoprolol  succinate (TOPROL -XL) 50 MG 24 hr tablet  Has the patient contacted their pharmacy? Yes (Agent: If no, request that the patient contact the pharmacy for the refill. If patient does not wish to contact the pharmacy document the reason why and proceed with request.) (Agent: If yes, when and what did the pharmacy advise?)  This is the patient's preferred pharmacy:  [facility] [phone] - [place], [place] - [address] AT [facility] [address]  [place] [address] Phone: [phone] Fax: [phone]   Is this the correct pharmacy for this prescription? Yes If no, delete pharmacy and type the correct one.   Has the prescription been filled recently? Yes  Is the patient out of the medication? Yes  Has the patient been seen for an appointment in the last year OR does the patient have an upcoming appointment? Yes  Can we respond through MyChart? Yes  Agent: Please be advised that Rx refills may take up to 3 business days. We ask that you follow-up with your pharmacy.

## 2024-03-24 NOTE — Telephone Encounter (Signed)
 Need to call pharmacy

## 2024-03-26 NOTE — Telephone Encounter (Signed)
 Duplicate request, too soon for refill. LRF 03/01/24 FOR 90/1 RF.E-Prescribing Status: Receipt confirmed by pharmacy (03/04/2024  1:02 PM EST).  Requested Prescriptions  Pending Prescriptions Disp Refills   metoprolol  succinate (TOPROL -XL) 50 MG 24 hr tablet 135 tablet 0    Sig: TAKE 1 AND 1/2 TABLETS BY MOUTH DAILY WITH OR IMMEDIATELY FOLLOWING A MEAL     Cardiovascular:  Beta Blockers Failed - 03/26/2024  8:20 AM      Failed - Last BP in normal range    BP Readings from Last 1 Encounters:  12/23/23 (!) 174/79         Passed - Last Heart Rate in normal range    Pulse Readings from Last 1 Encounters:  12/23/23 66         Passed - Valid encounter within last 6 months    Recent Outpatient Visits           3 months ago Type 2 diabetes mellitus with other specified complication, with long-term current use of insulin  (HCC)   [place] [facility] - A Dept Of [place]. [facility] Vicci Sober B, MD   6 months ago Type 2 diabetes mellitus without complication, with long-term current use of insulin  [facility])   [place] [facility] - A Dept Of [place]. [facility] Vicci Sober B, MD   7 months ago Type 2 diabetes mellitus with microalbuminuria, with long-term current use of insulin  [facility])   [place] [facility] - A Dept Of [place]. [facility], Jon M, [place]   10 months ago Unintentional weight loss   [place] [facility] [place] - A Dept Of [place]. [facility] Vicci Sober NOVAK, MD   12 months ago Herpes zoster without complication   [place] [facility] - A Dept Of [place]. [facility] Vicci Sober NOVAK, MD       Future Appointments             In 4 weeks Vicci Sober NOVAK, MD [facility] [place] - A Dept Of [place] DEL. [facility] [place]

## 2024-04-02 ENCOUNTER — Other Ambulatory Visit: Payer: Self-pay | Admitting: Internal Medicine

## 2024-04-02 DIAGNOSIS — I152 Hypertension secondary to endocrine disorders: Secondary | ICD-10-CM

## 2024-04-10 ENCOUNTER — Other Ambulatory Visit: Payer: Self-pay | Admitting: Internal Medicine

## 2024-04-10 DIAGNOSIS — I152 Hypertension secondary to endocrine disorders: Secondary | ICD-10-CM

## 2024-04-23 ENCOUNTER — Other Ambulatory Visit: Payer: Self-pay | Admitting: Internal Medicine

## 2024-04-23 ENCOUNTER — Ambulatory Visit: Admitting: Internal Medicine

## 2024-04-23 DIAGNOSIS — E1169 Type 2 diabetes mellitus with other specified complication: Secondary | ICD-10-CM

## 2024-04-23 DIAGNOSIS — I152 Hypertension secondary to endocrine disorders: Secondary | ICD-10-CM

## 2024-04-23 NOTE — Telephone Encounter (Unsigned)
 Copied from CRM #[id number]. Topic: Clinical - Medication Refill >> Apr 23, 2024  3:43 PM Zebedee SAUNDERS wrote: Medication: lisinopril -hydrochlorothiazide  (ZESTORETIC ) 20-12.5 MG tablet, pravastatin  (PRAVACHOL ) 40 MG tablet  Has the patient contacted their pharmacy? Yes (Agent: If no, request that the patient contact the pharmacy for the refill. If patient does not wish to contact the pharmacy document the reason why and proceed with request.) (Agent: If yes, when and what did the pharmacy advise?)  This is the patient's preferred pharmacy:  [facility] [phone] - [place], [place] - [address] AT [facility] [address] [place] [address] Phone: [phone] Fax: [phone]   Is this the correct pharmacy for this prescription? Yes If no, delete pharmacy and type the correct one.   Has the prescription been filled recently? Yes  Is the patient out of the medication? Yes  Has the patient been seen for an appointment in the last year OR does the patient have an upcoming appointment? Yes  Can we respond through MyChart? Yes  Agent: Please be advised that Rx refills may take up to 3 business days. We ask that you follow-up with your pharmacy.

## 2024-04-25 ENCOUNTER — Ambulatory Visit: Admitting: Podiatry

## 2024-04-25 ENCOUNTER — Encounter: Payer: Self-pay | Admitting: Podiatry

## 2024-04-25 DIAGNOSIS — R809 Proteinuria, unspecified: Secondary | ICD-10-CM | POA: Diagnosis not present

## 2024-04-25 DIAGNOSIS — Z794 Long term (current) use of insulin: Secondary | ICD-10-CM

## 2024-04-25 DIAGNOSIS — E1129 Type 2 diabetes mellitus with other diabetic kidney complication: Secondary | ICD-10-CM | POA: Diagnosis not present

## 2024-04-25 DIAGNOSIS — M79675 Pain in left toe(s): Secondary | ICD-10-CM | POA: Diagnosis not present

## 2024-04-25 DIAGNOSIS — L84 Corns and callosities: Secondary | ICD-10-CM

## 2024-04-25 DIAGNOSIS — B351 Tinea unguium: Secondary | ICD-10-CM | POA: Diagnosis not present

## 2024-04-25 DIAGNOSIS — M79674 Pain in right toe(s): Secondary | ICD-10-CM | POA: Diagnosis not present

## 2024-04-25 NOTE — Telephone Encounter (Signed)
 Too soon for refill.  Requested Prescriptions  Pending Prescriptions Disp Refills   lisinopril -hydrochlorothiazide  (ZESTORETIC ) 20-12.5 MG tablet 45 tablet 1    Sig: Take 0.5 tablets by mouth daily.     Cardiovascular:  ACEI + Diuretic Combos Failed - 04/25/2024  9:38 AM      Failed - Na in normal range and within 180 days    Sodium  Date Value Ref Range Status  08/02/2023 140 135 - 145 mmol/L Final  03/31/2023 141 134 - 144 mmol/L Final         Failed - K in normal range and within 180 days    Potassium  Date Value Ref Range Status  08/02/2023 4.1 3.5 - 5.1 mmol/L Final         Failed - Cr in normal range and within 180 days    Creat  Date Value Ref Range Status  07/29/2015 0.75 0.50 - 0.99 mg/dL Final   Creatinine, Ser  Date Value Ref Range Status  08/02/2023 0.83 0.44 - 1.00 mg/dL Final   Creatinine, POC  Date Value Ref Range Status  12/09/2017 50 mg/dL Final   Creatinine, Urine  Date Value Ref Range Status  11/16/2012 110.4 mg/dL Final         Failed - eGFR is 30 or above and within 180 days    GFR, Est African American  Date Value Ref Range Status  12/13/2014 71 >=60 mL/min Final   GFR calc Af Amer  Date Value Ref Range Status  09/20/2019 68 >59 mL/min/1.73 Final    Comment:    **[facility] currently reports eGFR in compliance with the current**   recommendations of the [employer]. [facility] will   update reporting as new guidelines are published from the NKF-ASN   Task force.    GFR, Est Non African American  Date Value Ref Range Status  12/13/2014 61 >=60 mL/min Final    Comment:      The estimated GFR is a calculation valid for adults (>=63 years old) that uses the CKD-EPI algorithm to adjust for age and sex. It is   not to be used for children, pregnant women, hospitalized patients,    patients on dialysis, or with rapidly changing kidney function. According to the NKDEP, eGFR >89 is normal, 60-89 shows mild impairment, 30-59 shows  moderate impairment, 15-29 shows severe impairment and <15 is ESRD.      GFR, Estimated  Date Value Ref Range Status  08/02/2023 >60 >60 mL/min Final    Comment:    (NOTE) Calculated using the CKD-EPI Creatinine Equation (2021)    eGFR  Date Value Ref Range Status  03/31/2023 68 >59 mL/min/1.73 Final         Failed - Last BP in normal range    BP Readings from Last 1 Encounters:  12/23/23 (!) 174/79         Passed - Patient is not pregnant      Passed - Valid encounter within last 6 months    Recent Outpatient Visits           4 months ago Type 2 diabetes mellitus with other specified complication, with long-term current use of insulin  (HCC)   [place] [facility] - A Dept Of [place]. [facility] Vicci Sober B, MD   7 months ago Type 2 diabetes mellitus without complication, with long-term current use of insulin  [facility])   [place] [facility] - A Dept Of [place].  [facility] Vicci Sober B, MD   8 months ago Type 2 diabetes mellitus with microalbuminuria, with long-term current use of insulin  [facility])   [place] [facility] - A Dept Of [place]. [facility], [place] M, [place]   11 months ago Unintentional weight loss   [place] [facility] [place] - A Dept Of [place]. [facility] Vicci Sober NOVAK, [place]   1 year ago Herpes zoster without complication   [place] [facility] - A Dept Of [place]. [facility] Vicci Sober NOVAK, [place]               pravastatin  (PRAVACHOL ) 40 MG tablet 90 tablet 1     Cardiovascular:  Antilipid - Statins Failed - 04/25/2024  9:38 AM      Failed - Lipid Panel in normal range within the last 12 months    Cholesterol, Total  Date Value Ref Range Status  12/23/2023 149 100 - 199 mg/dL Final   LDL Chol Calc (NIH)  Date Value Ref Range Status  12/23/2023 72 0 - 99 mg/dL Final   Direct LDL  Date Value Ref Range Status  12/03/2009 71  mg/dL Final    Comment:    See lab report for associated comment(s)   HDL  Date Value Ref Range Status  12/23/2023 65 >39 mg/dL Final   Triglycerides  Date Value Ref Range Status  12/23/2023 60 0 - 149 mg/dL Final         Passed - Patient is not pregnant      Passed - Valid encounter within last 12 months    Recent Outpatient Visits           4 months ago Type 2 diabetes mellitus with other specified complication, with long-term current use of insulin  (HCC)   [place] [facility] - A Dept Of [place]. [facility] Vicci Sober B, MD   7 months ago Type 2 diabetes mellitus without complication, with long-term current use of insulin  [facility])   [place] [facility] - A Dept Of [place]. [facility] Vicci Sober B, MD   8 months ago Type 2 diabetes mellitus with microalbuminuria, with long-term current use of insulin  [facility])   [address] [place] [address] [facility] - A Dept Of [place]. [facility], [place] M, [place]   11 months ago Unintentional weight loss   [place] [facility] [place] - A Dept Of [place]. [facility] Vicci Sober NOVAK, [place]   1 year ago Herpes zoster without complication    [facility] - A Dept Of [place]. [facility] Vicci Sober NOVAK, [place]

## 2024-04-25 NOTE — Progress Notes (Signed)
 This patient returns to my office for at risk foot care.  This patient requires this care by a professional since this patient will be at risk due to having This patient is unable to cut nails himself since the patient cannot reach his nails.These nails are painful walking and wearing shoes.  This patient presents for at risk foot care today.  General Appearance  Alert, conversant and in no acute stress.  Vascular  Dorsalis pedis and posterior tibial  pulses are palpable  bilaterally.  Capillary return is within normal limits  bilaterally. Temperature is within normal limits  bilaterally.  Neurologic  Senn-Weinstein monofilament wire test within normal limits  bilaterally. Muscle power within normal limits bilaterally.  Nails Thick disfigured discolored nails with subungual debris  from hallux to fifth toes bilaterally. No evidence of bacterial infection or drainage bilaterally.  Orthopedic  No limitations of motion  feet .  No crepitus or effusions noted.  No bony pathology or digital deformities noted.  Skin  normotropic skin with no porokeratosis noted bilaterally.  No signs of infections or ulcers noted.   Pinch callus  B/L.  Listers corn  B/L.  Onychomycosis  Pain in right toes  Pain in left toes  Debride callus  B/L with # 15 blade and dremel tool.  Consent was obtained for treatment procedures.   Mechanical debridement of nails 1-5  bilaterally performed with a nail nipper.  Filed with dremel without incident.    Return office visit                     Told patient to return for periodic foot care and evaluation due to potential at risk complications.   Cordella Bold DPM

## 2024-05-14 ENCOUNTER — Other Ambulatory Visit: Payer: Self-pay | Admitting: Internal Medicine

## 2024-05-14 DIAGNOSIS — E1129 Type 2 diabetes mellitus with other diabetic kidney complication: Secondary | ICD-10-CM

## 2024-05-28 ENCOUNTER — Ambulatory Visit: Payer: Self-pay | Admitting: Internal Medicine

## 2024-07-25 ENCOUNTER — Ambulatory Visit: Admitting: Podiatry

## 2025-01-01 ENCOUNTER — Ambulatory Visit
